# Patient Record
Sex: Female | Born: 1995 | Race: Black or African American | Hispanic: No | Smoking: Never smoker
Health system: Southern US, Community
[De-identification: ages and names within clinical notes are randomized; demographics above are authoritative.]

## PROBLEM LIST (undated history)

## (undated) ENCOUNTER — Inpatient Hospital Stay (HOSPITAL_COMMUNITY): Payer: Self-pay

## (undated) DIAGNOSIS — Z789 Other specified health status: Secondary | ICD-10-CM

## (undated) HISTORY — DX: Other specified health status: Z78.9

## (undated) HISTORY — PX: NO PAST SURGERIES: SHX2092

## (undated) HISTORY — PX: COSMETIC SURGERY: SHX468

---

## 2013-06-25 ENCOUNTER — Emergency Department (HOSPITAL_COMMUNITY)
Admission: EM | Admit: 2013-06-25 | Discharge: 2013-06-25 | Disposition: A | Discharge status: Home / Self Care | Payer: PRIVATE HEALTH INSURANCE | Attending: Emergency Medicine | Admitting: Emergency Medicine

## 2013-06-25 ENCOUNTER — Encounter (HOSPITAL_COMMUNITY): Payer: Self-pay | Admitting: Emergency Medicine

## 2013-06-25 DIAGNOSIS — J069 Acute upper respiratory infection, unspecified: Secondary | ICD-10-CM | POA: Insufficient documentation

## 2013-06-25 DIAGNOSIS — Z880 Allergy status to penicillin: Secondary | ICD-10-CM | POA: Insufficient documentation

## 2013-06-25 DIAGNOSIS — J029 Acute pharyngitis, unspecified: Secondary | ICD-10-CM

## 2013-06-25 LAB — MONONUCLEOSIS SCREEN: MONO SCREEN: NEGATIVE

## 2013-06-25 LAB — RAPID STREP SCREEN (MED CTR MEBANE ONLY): Streptococcus, Group A Screen (Direct): NEGATIVE

## 2013-06-25 MED ORDER — MAGIC MOUTHWASH
5.0000 mL | Freq: Once | ORAL | Status: AC
  Administered 2013-06-25: 5 mL via ORAL
  Filled 2013-06-25: qty 5

## 2013-06-25 MED ORDER — HYDROCODONE-ACETAMINOPHEN 7.5-325 MG/15ML PO SOLN: 10.0000 mL | Freq: Four times a day (QID) | ORAL | Status: DC | PRN

## 2013-06-25 NOTE — ED Provider Notes (Signed)
Medical screening examination/treatment/procedure(s) were performed by non-physician practitioner and as supervising physician I was immediately available for consultation/collaboration.   EKG Interpretation None        Gwyneth Sprout, MD 06/25/13 1236

## 2013-06-25 NOTE — ED Notes (Signed)
Started last pm with sore throat, neck pain and headache.

## 2013-06-25 NOTE — Discharge Instructions (Signed)
Upper Respiratory Infection, Adult °An upper respiratory infection (URI) is also sometimes known as the common cold. The upper respiratory tract includes the nose, sinuses, throat, trachea, and bronchi. Bronchi are the airways leading to the lungs. Most people improve within 1 week, but symptoms can last up to 2 weeks. A residual cough may last even longer.  °CAUSES °Many different viruses can infect the tissues lining the upper respiratory tract. The tissues become irritated and inflamed and often become very moist. Mucus production is also common. A cold is contagious. You can easily spread the virus to others by oral contact. This includes kissing, sharing a glass, coughing, or sneezing. Touching your mouth or nose and then touching a surface, which is then touched by another person, can also spread the virus. °SYMPTOMS  °Symptoms typically develop 1 to 3 days after you come in contact with a cold virus. Symptoms vary from person to person. They may include: °· Runny nose. °· Sneezing. °· Nasal congestion. °· Sinus irritation. °· Sore throat. °· Loss of voice (laryngitis). °· Cough. °· Fatigue. °· Muscle aches. °· Loss of appetite. °· Headache. °· Low-grade fever. °DIAGNOSIS  °You might diagnose your own cold based on familiar symptoms, since most people get a cold 2 to 3 times a year. Your caregiver can confirm this based on your exam. Most importantly, your caregiver can check that your symptoms are not due to another disease such as strep throat, sinusitis, pneumonia, asthma, or epiglottitis. Blood tests, throat tests, and X-rays are not necessary to diagnose a common cold, but they may sometimes be helpful in excluding other more serious diseases. Your caregiver will decide if any further tests are required. °RISKS AND COMPLICATIONS  °You may be at risk for a more severe case of the common cold if you smoke cigarettes, have chronic heart disease (such as heart failure) or lung disease (such as asthma), or if  you have a weakened immune system. The very young and very old are also at risk for more serious infections. Bacterial sinusitis, middle ear infections, and bacterial pneumonia can complicate the common cold. The common cold can worsen asthma and chronic obstructive pulmonary disease (COPD). Sometimes, these complications can require emergency medical care and may be life-threatening. °PREVENTION  °The best way to protect against getting a cold is to practice good hygiene. Avoid oral or hand contact with people with cold symptoms. Wash your hands often if contact occurs. There is no clear evidence that vitamin C, vitamin E, echinacea, or exercise reduces the chance of developing a cold. However, it is always recommended to get plenty of rest and practice good nutrition. °TREATMENT  °Treatment is directed at relieving symptoms. There is no cure. Antibiotics are not effective, because the infection is caused by a virus, not by bacteria. Treatment may include: °· Increased fluid intake. Sports drinks offer valuable electrolytes, sugars, and fluids. °· Breathing heated mist or steam (vaporizer or shower). °· Eating chicken soup or other clear broths, and maintaining good nutrition. °· Getting plenty of rest. °· Using gargles or lozenges for comfort. °· Controlling fevers with ibuprofen or acetaminophen as directed by your caregiver. °· Increasing usage of your inhaler if you have asthma. °Zinc gel and zinc lozenges, taken in the first 24 hours of the common cold, can shorten the duration and lessen the severity of symptoms. Pain medicines may help with fever, muscle aches, and throat pain. A variety of non-prescription medicines are available to treat congestion and runny nose. Your caregiver   can make recommendations and may suggest nasal or lung inhalers for other symptoms.  HOME CARE INSTRUCTIONS   Only take over-the-counter or prescription medicines for pain, discomfort, or fever as directed by your  caregiver.  Use a warm mist humidifier or inhale steam from a shower to increase air moisture. This may keep secretions moist and make it easier to breathe.  Drink enough water and fluids to keep your urine clear or pale yellow.  Rest as needed.  Return to work when your temperature has returned to normal or as your caregiver advises. You may need to stay home longer to avoid infecting others. You can also use a face mask and careful hand washing to prevent spread of the virus. SEEK MEDICAL CARE IF:   After the first few days, you feel you are getting worse rather than better.  You need your caregiver's advice about medicines to control symptoms.  You develop chills, worsening shortness of breath, or brown or red sputum. These may be signs of pneumonia.  You develop yellow or brown nasal discharge or pain in the face, especially when you bend forward. These may be signs of sinusitis.  You develop a fever, swollen neck glands, pain with swallowing, or white areas in the back of your throat. These may be signs of strep throat. SEEK IMMEDIATE MEDICAL CARE IF:   You have a fever.  You develop severe or persistent headache, ear pain, sinus pain, or chest pain.  You develop wheezing, a prolonged cough, cough up blood, or have a change in your usual mucus (if you have chronic lung disease).  You develop sore muscles or a stiff neck. Document Released: 07/17/2000 Document Revised: 04/15/2011 Document Reviewed: 05/25/2010 [facility] Patient Information 2014 [place], [place].  Viral Pharyngitis Viral pharyngitis is a viral infection that produces redness, pain, and swelling (inflammation) of the throat. It can spread from person to person (contagious). CAUSES Viral pharyngitis is caused by inhaling a large amount of certain germs called viruses. Many different viruses cause viral pharyngitis. SYMPTOMS Symptoms of viral pharyngitis include:  Sore throat.  Tiredness.  Stuffy  nose.  Low-grade fever.  Congestion.  Cough. TREATMENT Treatment includes rest, drinking plenty of fluids, and the use of over-the-counter medication (approved by your caregiver). HOME CARE INSTRUCTIONS   Drink enough fluids to keep your urine clear or pale yellow.  Eat soft, cold foods such as ice cream, frozen ice pops, or gelatin dessert.  Gargle with warm salt water (1 tsp salt per 1 qt of water).  If over age 30, throat lozenges may be used safely.  Only take over-the-counter or prescription medicines for pain, discomfort, or fever as directed by your caregiver. Do not take aspirin. To help prevent spreading viral pharyngitis to others, avoid:  Mouth-to-mouth contact with others.  Sharing utensils for eating and drinking.  Coughing around others. SEEK MEDICAL CARE IF:   You are better in a few days, then become worse.  You have a fever or pain not helped by pain medicines.  There are any other changes that concern you. Document Released: 10/31/2004 Document Revised: 04/15/2011 Document Reviewed: 03/29/2010 [facility] Patient Information 2014 [place], [place].

## 2013-06-25 NOTE — ED Provider Notes (Signed)
CSN: [id number]     Arrival date & time 06/25/13  1043 History  This chart was scribed for non-physician practitioner, Fayrene Helper, PA-C working with Gwyneth Sprout, MD by Greggory Stallion, ED scribe. This patient was seen in room TR08C/TR08C and the patient's care was started at 11:05 AM.   Chief Complaint  Patient presents with  . Sore Throat   The history is provided by the patient. No language interpreter was used.   HPI Comments: Ashley [name] is a 18 y.o. female who presents to the Emergency Department complaining of gradual onset sore throat that started yesterday. She is also complaining of headache, chills, sneezing, cough and neck pain that started this morning. Pt has had strep throat a couple of times in the past. Denies fever, rhinorrhea, congestion, ear pain, rash, abdominal pain. Denies sick contacts.    History reviewed. No pertinent past medical history. History reviewed. No pertinent past surgical history. No family history on file. History  Substance Use Topics  . Smoking status: Never Smoker   . Smokeless tobacco: Not on file  . Alcohol Use: No   OB History   Grav Para Term Preterm Abortions TAB SAB Ect Mult Living                 Review of Systems  Constitutional: Positive for chills. Negative for fever.  HENT: Positive for sneezing and sore throat. Negative for congestion, ear pain and rhinorrhea.   Respiratory: Positive for cough.   Gastrointestinal: Negative for abdominal pain.  Musculoskeletal: Positive for neck pain.  Skin: Negative for rash.  Neurological: Positive for headaches.  All other systems reviewed and are negative.  Allergies  Penicillins  Home Medications   Prior to Admission medications   Not on File   BP 112/62  Pulse 82  Temp(Src) 98.3 F (36.8 C) (Oral)  SpO2 100%  Physical Exam  Nursing note and vitals reviewed. Constitutional: She is oriented to person, place, and time. She appears well-developed and well-nourished. No  distress.  HENT:  Head: Normocephalic and atraumatic.  Right Ear: Tympanic membrane and ear canal normal.  Left Ear: Tympanic membrane and ear canal normal.  Nose: Rhinorrhea present.  Mouth/Throat: Uvula is midline. No trismus in the jaw. Oropharyngeal exudate (bilateral) present. No posterior oropharyngeal edema.  No deep tissue infection. No obvious abscess.   Eyes: EOM are normal.  Neck: Neck supple. No tracheal deviation present.  Cardiovascular: Normal rate, regular rhythm and normal heart sounds.   Pulmonary/Chest: Effort normal and breath sounds normal. No respiratory distress. She has no wheezes. She has no rhonchi. She has no rales.  Abdominal: Soft. There is no splenomegaly. There is no tenderness.  Musculoskeletal: Normal range of motion.  Neurological: She is alert and oriented to person, place, and time.  Skin: Skin is warm and dry.  Psychiatric: She has a normal mood and affect. Her behavior is normal.    ED Course  Procedures (including critical care time)  DIAGNOSTIC STUDIES: Oxygen Saturation is 100% on RA, normal by my interpretation.    COORDINATION OF CARE: 11:08 AM-Discussed treatment plan which includes rapid strep test and mono test with pt at bedside and pt agreed to plan.   12:30 PM Rapid strep and monospot neg.  Likely viral URI.  Will treat sxs including gargle with salt water.  Return precaution discussed.  Labs Review Labs Reviewed  RAPID STREP SCREEN  CULTURE, GROUP A STREP  MONONUCLEOSIS SCREEN    Imaging Review No results found.  EKG Interpretation None      MDM   Final diagnoses:  URI (upper respiratory infection)  Pharyngitis    BP 112/62  Pulse 82  Temp(Src) 98.3 F (36.8 C) (Oral)  SpO2 100%  I have reviewed nursing notes and vital signs.  I reviewed available ER/hospitalization records thought the EMR   I personally performed the services described in this documentation, which was scribed in my presence. The  recorded information has been reviewed and is accurate.  Fayrene HelperBowie Janice Bodine, PA-C 06/25/13 1231

## 2013-06-27 LAB — CULTURE, GROUP A STREP

## 2014-12-02 DIAGNOSIS — S060X0A Concussion without loss of consciousness, initial encounter: Secondary | ICD-10-CM

## 2014-12-02 HISTORY — DX: Concussion without loss of consciousness, initial encounter: S06.0X0A

## 2015-02-05 NOTE — L&D Delivery Note (Signed)
Delivery Note  20 y/o now G1P1 induced for gHTN induced with cyctoec. Pt did have recurrent  variables and had IUPC placed with amnioinfusion. Variables did improve At  a viable female was delivered via  (Presentation: ROA with compount hand ;  ).  APGAR: 9 and 9,   Placenta status: intact delivered intact with gentle traction.  Cord: 3 vessels.  Anesthesia:  epidural Episiotomy:   none Lacerations:   2nd degree posterior Suture Repair: 3.0 vicryl Est. Blood Loss (mL):   200  Mom to postpartum.  Baby to Couplet care / Skin to Skin.  Ernestina Pennaicholas Schenk 09/20/2015, 2:43 PM

## 2015-02-23 ENCOUNTER — Telehealth: Payer: Self-pay | Admitting: *Deleted

## 2015-02-23 NOTE — Telephone Encounter (Signed)
Patient's mother is calling- her daughter has an appointment on February 14- she has been having problems eating and drinking. Some nausea but no vomiting at this point. Advised OTC Unisom,Bonine, Vit B6, grazing, bland diet, Tums , Mylanta. Techniques for eating during the first trimester. If she should get worse and start vomiting and not keep anything down- advised MAU for fluids and IV meds.

## 2015-03-21 ENCOUNTER — Encounter: Payer: Self-pay | Admitting: Certified Nurse Midwife

## 2015-03-21 ENCOUNTER — Ambulatory Visit (INDEPENDENT_AMBULATORY_CARE_PROVIDER_SITE_OTHER): Payer: Medicaid Other | Admitting: Certified Nurse Midwife

## 2015-03-21 VITALS — BP 86/61 | HR 78 | Temp 98.1°F | Ht 67.0 in | Wt 131.0 lb

## 2015-03-21 DIAGNOSIS — Z3490 Encounter for supervision of normal pregnancy, unspecified, unspecified trimester: Secondary | ICD-10-CM | POA: Insufficient documentation

## 2015-03-21 DIAGNOSIS — Z3401 Encounter for supervision of normal first pregnancy, first trimester: Secondary | ICD-10-CM

## 2015-03-21 DIAGNOSIS — O219 Vomiting of pregnancy, unspecified: Secondary | ICD-10-CM

## 2015-03-21 LAB — POCT URINALYSIS DIPSTICK
BILIRUBIN UA: NEGATIVE
Blood, UA: NEGATIVE
Glucose, UA: NEGATIVE
KETONES UA: NEGATIVE
LEUKOCYTES UA: NEGATIVE
Nitrite, UA: NEGATIVE
Protein, UA: NEGATIVE
Spec Grav, UA: 1.01
Urobilinogen, UA: NEGATIVE
pH, UA: 8

## 2015-03-21 MED ORDER — VITAFOL GUMMIES 3.33-0.333-34.8 MG PO CHEW
3.0000 | CHEWABLE_TABLET | Freq: Every day | ORAL | Status: DC
Start: 2015-03-21 — End: 2015-09-08

## 2015-03-21 MED ORDER — DOXYLAMINE-PYRIDOXINE 10-10 MG PO TBEC: DELAYED_RELEASE_TABLET | ORAL | Status: DC

## 2015-03-21 NOTE — Progress Notes (Signed)
Subjective:    Ashley [name] is being seen today for her first obstetrical visit.  This is not a planned pregnancy. She is at [redacted]w[redacted]d gestation. Her obstetrical history is significant for none. Relationship with FOB: not involved, unsure of FOB. Patient does intend to breast feed. Pregnancy history fully reviewed.  In school full time.    The information documented in the HPI was reviewed and verified.  Menstrual History: OB History    Gravida Para Term Preterm AB TAB SAB Ectopic Multiple Living   1               Menarche age: around 20 years of age  Patient's last menstrual period was 12/27/2014.    Past Medical History  Diagnosis Date  . Medical history non-contributory     History reviewed. No pertinent past surgical history.   (Not in a hospital admission) Allergies  Allergen Reactions  . Penicillins Hives and Rash    Foamed from mouth    Social History  Substance Use Topics  . Smoking status: Never Smoker   . Smokeless tobacco: Never Used  . Alcohol Use: No    Family History  Problem Relation Age of Onset  . Diabetes Mother   . Hypertension Mother      Review of Systems Constitutional: negative for weight loss Gastrointestinal: negative for vomiting, + nausea Genitourinary:negative for genital lesions and vaginal discharge and dysuria Musculoskeletal:negative for back pain Behavioral/Psych: negative for abusive relationship, depression, illegal drug usage and tobacco use    Objective:    BP 86/61 mmHg  Pulse 78  Temp(Src) 98.1 F (36.7 C)  Ht  (1.702 m)  Wt 131 lb (59.421 kg)  BMI 20.51 kg/m2  LMP 12/27/2014 General Appearance:    Alert, cooperative, no distress, appears stated age  Head:    Normocephalic, without obvious abnormality, atraumatic  Eyes:    PERRL, conjunctiva/corneas clear, EOM's intact, fundi    benign, both eyes  Ears:    Normal TM's and external ear canals, both ears  Nose:   Nares normal, septum midline, mucosa normal, no  drainage    or sinus tenderness  Throat:   Lips, mucosa, and tongue normal; teeth and gums normal  Neck:   Supple, symmetrical, trachea midline, no adenopathy;    thyroid:  no enlargement/tenderness/nodules; no carotid   bruit or JVD  Back:     Symmetric, no curvature, ROM normal, no CVA tenderness  Lungs:     Clear to auscultation bilaterally, respirations unlabored  Chest Wall:    No tenderness or deformity   Heart:    Regular rate and rhythm, S1 and S2 normal, no murmur, rub   or gallop  Breast Exam:    No tenderness, masses, or nipple abnormality  Abdomen:     Soft, non-tender, bowel sounds active all four quadrants,    no masses, no organomegaly  Genitalia:    Normal female without lesion, discharge or tenderness  Extremities:   Extremities normal, atraumatic, no cyanosis or edema  Pulses:   2+ and symmetric all extremities  Skin:   Skin color, texture, turgor normal, no rashes or lesions  Lymph nodes:   Cervical, supraclavicular, and axillary nodes normal  Neurologic:   CNII-XII intact, normal strength, sensation and reflexes    throughout         Cervix:  Long, thick, closed and posterior.  FHR seen with bedside US.      Lab Review Urine pregnancy test Labs reviewed yes Radiologic  studies reviewed no Assessment:    Pregnancy at [redacted]w[redacted]d weeks    Plan:      Prenatal vitamins.  Counseling provided regarding continued use of seat belts, cessation of alcohol consumption, smoking or use of illicit drugs; infection precautions i.e., influenza/TDAP immunizations, toxoplasmosis,CMV, parvovirus, listeria and varicella; workplace safety, exercise during pregnancy; routine dental care, safe medications, sexual activity, hot tubs, saunas, pools, travel, caffeine use, fish and methlymercury, potential toxins, hair treatments, varicose veins Weight gain recommendations per IOM guidelines reviewed: underweight/BMI< 18.5--> gain 28 - 40 lbs; normal weight/BMI 18.5 - 24.9--> gain 25 - 35 lbs;  overweight/BMI 25 - 29.9--> gain 15 - 25 lbs; obese/BMI >30->gain  11 - 20 lbs Problem list reviewed and updated. FIRST/CF mutation testing/NIPT/QUAD SCREEN/fragile X/[name] population testing/Spinal muscular atrophy discussed: requested. Role of ultrasound in pregnancy discussed; fetal survey: requested. Amniocentesis discussed: not indicated. VBAC calculator score: VBAC consent form provided No orders of the defined types were placed in this encounter.   Orders Placed This Encounter  Procedures  . Culture, OB Urine  . SureSwab, Vaginosis/Vaginitis Plus  . Obstetric panel  . HIV antibody  . Hemoglobinopathy evaluation  . Varicella zoster antibody, IgG  . VITAMIN D 25 Hydroxy (Vit-D Deficiency, Fractures)  . POCT urinalysis dipstick    Follow up in 4 weeks. 50% of 30 min visit spent on counseling and coordination of care.

## 2015-03-22 ENCOUNTER — Ambulatory Visit (INDEPENDENT_AMBULATORY_CARE_PROVIDER_SITE_OTHER): Payer: Medicaid Other

## 2015-03-22 DIAGNOSIS — O3680X1 Pregnancy with inconclusive fetal viability, fetus 1: Secondary | ICD-10-CM | POA: Diagnosis not present

## 2015-03-22 DIAGNOSIS — Z3401 Encounter for supervision of normal first pregnancy, first trimester: Secondary | ICD-10-CM

## 2015-03-22 LAB — OBSTETRIC PANEL
Antibody Screen: NEGATIVE
BASOS ABS: 0 10*3/uL (ref 0.0–0.1)
Basophils Relative: 0 % (ref 0–1)
Eosinophils Absolute: 0.1 10*3/uL (ref 0.0–0.7)
Eosinophils Relative: 2 % (ref 0–5)
HEMATOCRIT: 35.8 % — AB (ref 36.0–46.0)
HEP B S AG: NEGATIVE
Hemoglobin: 11.4 g/dL — ABNORMAL LOW (ref 12.0–15.0)
LYMPHS PCT: 20 % (ref 12–46)
Lymphs Abs: 1.4 10*3/uL (ref 0.7–4.0)
MCH: 28.7 pg (ref 26.0–34.0)
MCHC: 31.8 g/dL (ref 30.0–36.0)
MCV: 90.2 fL (ref 78.0–100.0)
MPV: 11.2 fL (ref 8.6–12.4)
Monocytes Absolute: 0.4 10*3/uL (ref 0.1–1.0)
Monocytes Relative: 6 % (ref 3–12)
NEUTROS ABS: 5 10*3/uL (ref 1.7–7.7)
NEUTROS PCT: 72 % (ref 43–77)
Platelets: 251 10*3/uL (ref 150–400)
RBC: 3.97 MIL/uL (ref 3.87–5.11)
RDW: 16.7 % — AB (ref 11.5–15.5)
Rh Type: POSITIVE
Rubella: 5.13 Index — ABNORMAL HIGH (ref <0.90)
WBC: 7 10*3/uL (ref 4.0–10.5)

## 2015-03-22 LAB — HIV ANTIBODY (ROUTINE TESTING W REFLEX): HIV 1&2 Ab, 4th Generation: NONREACTIVE

## 2015-03-22 LAB — VARICELLA ZOSTER ANTIBODY, IGG: Varicella IgG: 397.6 Index — ABNORMAL HIGH (ref <135.00)

## 2015-03-22 LAB — VITAMIN D 25 HYDROXY (VIT D DEFICIENCY, FRACTURES): Vit D, 25-Hydroxy: 29 ng/mL — ABNORMAL LOW (ref 30–100)

## 2015-03-22 LAB — CULTURE, OB URINE
Colony Count: NO GROWTH
ORGANISM ID, BACTERIA: NO GROWTH

## 2015-03-23 ENCOUNTER — Telehealth: Payer: Self-pay | Admitting: *Deleted

## 2015-03-23 LAB — HEMOGLOBINOPATHY EVALUATION
HGB A2 QUANT: 2.6 % (ref 2.2–3.2)
HGB S QUANTITAION: 0 %
Hemoglobin Other: 0 %
Hgb A: 97.4 % (ref 96.8–97.8)
Hgb F Quant: 0 % (ref 0.0–2.0)

## 2015-03-23 NOTE — Telephone Encounter (Signed)
Pt mother called to office stating her daughter was seen this week for her OB visit and she thinks she may have strep throat. Would like to know what pt could take or do for throat.  Return call. Made aware that pt may be seen in office tomorrow for strep test. Made aware that only medication for strep is antibiotics.  Made aware that she may try Cepacol lozenges.  Advised that if she continues to monitor symptoms and does not come to office she may be seen at urgent care as well.

## 2015-03-25 LAB — SURESWAB, VAGINOSIS/VAGINITIS PLUS
Atopobium vaginae: NOT DETECTED Log (cells/mL)
C. ALBICANS, DNA: DETECTED — AB
C. GLABRATA, DNA: NOT DETECTED
C. PARAPSILOSIS, DNA: DETECTED — AB
C. trachomatis RNA, TMA: NOT DETECTED
C. tropicalis, DNA: NOT DETECTED
Gardnerella vaginalis: NOT DETECTED Log (cells/mL)
LACTOBACILLUS SPECIES: 7.6 Log (cells/mL)
MEGASPHAERA SPECIES: NOT DETECTED Log (cells/mL)
N. GONORRHOEAE RNA, TMA: NOT DETECTED
T. VAGINALIS RNA, QL TMA: NOT DETECTED

## 2015-03-28 ENCOUNTER — Other Ambulatory Visit: Payer: Self-pay | Admitting: Certified Nurse Midwife

## 2015-03-28 ENCOUNTER — Telehealth: Payer: Self-pay | Admitting: *Deleted

## 2015-03-28 DIAGNOSIS — N760 Acute vaginitis: Secondary | ICD-10-CM

## 2015-03-28 MED ORDER — TERCONAZOLE 0.4 % VA CREA: 1.0000 | TOPICAL_CREAM | Freq: Every day | VAGINAL | Status: DC

## 2015-03-28 MED ORDER — FLUCONAZOLE 100 MG PO TABS: 100.0000 mg | ORAL_TABLET | Freq: Once | ORAL | Status: DC

## 2015-03-28 NOTE — Telephone Encounter (Signed)
Attempt to contact pt regarding Diclegis Rx. LM on VM making pt aware medication has been approved by insurance.  Advised to make pharmacy aware of any updates to insurance.

## 2015-03-30 ENCOUNTER — Other Ambulatory Visit: Payer: Medicaid Other

## 2015-04-18 ENCOUNTER — Ambulatory Visit (INDEPENDENT_AMBULATORY_CARE_PROVIDER_SITE_OTHER): Payer: Medicaid Other | Admitting: Certified Nurse Midwife

## 2015-04-18 VITALS — BP 111/61 | HR 79 | Temp 98.2°F | Wt 131.0 lb

## 2015-04-18 DIAGNOSIS — Z3402 Encounter for supervision of normal first pregnancy, second trimester: Secondary | ICD-10-CM

## 2015-04-18 LAB — POCT URINALYSIS DIPSTICK
BILIRUBIN UA: NEGATIVE
Blood, UA: NEGATIVE
GLUCOSE UA: NEGATIVE
Ketones, UA: NEGATIVE
LEUKOCYTES UA: NEGATIVE
Nitrite, UA: NEGATIVE
Protein, UA: NEGATIVE
Spec Grav, UA: 1.015
Urobilinogen, UA: NEGATIVE
pH, UA: 6

## 2015-04-18 NOTE — Progress Notes (Signed)
  Subjective:    Donneisha Harland DingwallSettle is a 20 y.o. female being seen today for her obstetrical visit. She is at 6055w0d gestation. Patient reports: no complaints.  Problem List Items Addressed This Visit    None    Visit Diagnoses    Encounter for supervision of normal first pregnancy in second trimester    -  Primary    Relevant Orders    POCT urinalysis dipstick    US OB Comp + 14 Wk    AFP, Quad Screen    Hemoglobin A1c      Patient Active Problem List   Diagnosis Date Noted  . Supervision of normal first pregnancy 03/21/2015    Objective:     BP 111/61 mmHg  Pulse 79  Temp(Src) 98.2 F (36.8 C)  Wt 131 lb (59.421 kg)  LMP 12/27/2014 Uterine Size: Below umbilicus   FHR: 158 by doppler  Assessment:    Pregnancy @ 5555w0d  weeks Doing well    Plan:    Problem list reviewed and updated. Labs reviewed.  Follow up in 4 weeks. FIRST/CF mutation testing/NIPT/QUAD SCREEN/fragile X/[name] population testing/Spinal muscular atrophy discussed: ordered. Role of ultrasound in pregnancy discussed; fetal survey: ordered. Amniocentesis discussed: not indicated. 50% of 15 minute visit spent on counseling and coordination of care.

## 2015-04-21 LAB — AFP, QUAD SCREEN
DIA MOM VALUE: 0.56
DIA Value (EIA): 112.62 pg/mL
DSR (By Age)    1 IN: 1137
DSR (Second Trimester) 1 IN: 8504
Gestational Age: 15.9 WEEKS
MATERNAL AGE AT EDD: 21.6 a
MSAFP Mom: 0.66
MSAFP: 23.4 ng/mL
MSHCG Mom: 1.72
MSHCG: 81024 m[IU]/mL
OSB RISK: 10000
PDF: 0
T18 (By Age): 1:4429 {titer}
Test Results:: NEGATIVE
UE3 MOM: 0.89
Weight: 131 [lb_av]
uE3 Value: 0.73 ng/mL

## 2015-04-21 LAB — HEMOGLOBIN A1C
ESTIMATED AVERAGE GLUCOSE: 114 mg/dL
HEMOGLOBIN A1C: 5.6 % (ref 4.8–5.6)

## 2015-05-02 ENCOUNTER — Telehealth: Payer: Self-pay | Admitting: *Deleted

## 2015-05-02 NOTE — Telephone Encounter (Signed)
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 Explained to patient- as with any job- as long as she is comfortable and not doing strenuous work- she she be fine. Patient agrees.

## 2015-05-04 ENCOUNTER — Ambulatory Visit (INDEPENDENT_AMBULATORY_CARE_PROVIDER_SITE_OTHER): Payer: Medicaid Other

## 2015-05-04 DIAGNOSIS — Z36 Encounter for antenatal screening of mother: Secondary | ICD-10-CM

## 2015-05-04 DIAGNOSIS — Z3402 Encounter for supervision of normal first pregnancy, second trimester: Secondary | ICD-10-CM

## 2015-05-16 ENCOUNTER — Ambulatory Visit (INDEPENDENT_AMBULATORY_CARE_PROVIDER_SITE_OTHER): Payer: Medicaid Other | Admitting: Certified Nurse Midwife

## 2015-05-16 ENCOUNTER — Telehealth: Payer: Self-pay

## 2015-05-16 VITALS — BP 109/69 | HR 64 | Wt 130.0 lb

## 2015-05-16 DIAGNOSIS — Z3402 Encounter for supervision of normal first pregnancy, second trimester: Secondary | ICD-10-CM

## 2015-05-16 LAB — POCT URINALYSIS DIPSTICK
Bilirubin, UA: NEGATIVE
Glucose, UA: NEGATIVE
Ketones, UA: NEGATIVE
LEUKOCYTES UA: NEGATIVE
Nitrite, UA: NEGATIVE
PH UA: 6
PROTEIN UA: NEGATIVE
RBC UA: NEGATIVE
Spec Grav, UA: 1.02
UROBILINOGEN UA: NEGATIVE

## 2015-05-16 NOTE — Progress Notes (Signed)
Pt states that she had severe cramping on Sunday.  Pt denies any bleeding, no cramping since then.  Pt does report +FM.

## 2015-05-16 NOTE — Telephone Encounter (Signed)
[facility] said Rosey Batheresa would have to resend images to get report - U/S done 05/04/15

## 2015-05-16 NOTE — Patient Instructions (Signed)

## 2015-05-16 NOTE — Progress Notes (Signed)
Subjective:    Dalynn Harland DingwallSettle is a 20 y.o. female being seen today for her obstetrical visit. She is at 2327w0d gestation. Patient reports: no complaints . Fetal movement: normal.  Discussed cramping that occurred on Sunday, lasted less than 1 hour, encouraged fluids and tylenol.  If occurs again and lasts longer after interventions or vaginal bleeding/leaking head to MAU.  Patient verbalized understanding.   Problem List Items Addressed This Visit    None    Visit Diagnoses    Encounter for supervision of normal first pregnancy in second trimester    -  Primary    Relevant Orders    POCT urinalysis dipstick (Completed)      Patient Active Problem List   Diagnosis Date Noted  . Supervision of normal first pregnancy 03/21/2015   Objective:    BP 109/69 mmHg  Pulse 64  Wt 130 lb (58.968 kg)  LMP 12/27/2014 FHT: 145 BPM  Uterine Size: size equals dates     Assessment:    Pregnancy @ 4027w0d    Normal pregnancy discomforts  Plan:    OBGCT: discussed. Signs and symptoms of preterm labor: discussed.  Labs, problem list reviewed and updated 2 hr GTT planned Follow up in 4 weeks.

## 2015-05-23 ENCOUNTER — Other Ambulatory Visit: Payer: Self-pay | Admitting: Certified Nurse Midwife

## 2015-05-29 ENCOUNTER — Encounter (HOSPITAL_COMMUNITY): Payer: Self-pay

## 2015-05-29 ENCOUNTER — Telehealth: Payer: Self-pay | Admitting: *Deleted

## 2015-05-29 ENCOUNTER — Inpatient Hospital Stay (HOSPITAL_COMMUNITY)
Admission: AD | Admit: 2015-05-29 | Discharge: 2015-05-29 | Disposition: A | Discharge status: Home / Self Care | Payer: Medicaid Other | Source: Ambulatory Visit | Attending: Obstetrics | Admitting: Obstetrics

## 2015-05-29 DIAGNOSIS — O2652 Maternal hypotension syndrome, second trimester: Secondary | ICD-10-CM | POA: Insufficient documentation

## 2015-05-29 DIAGNOSIS — Z3A21 21 weeks gestation of pregnancy: Secondary | ICD-10-CM | POA: Insufficient documentation

## 2015-05-29 DIAGNOSIS — R42 Dizziness and giddiness: Secondary | ICD-10-CM | POA: Diagnosis present

## 2015-05-29 DIAGNOSIS — Z880 Allergy status to penicillin: Secondary | ICD-10-CM | POA: Insufficient documentation

## 2015-05-29 LAB — CBC
HEMATOCRIT: 33.6 % — AB (ref 36.0–46.0)
HEMOGLOBIN: 11.4 g/dL — AB (ref 12.0–15.0)
MCH: 30.8 pg (ref 26.0–34.0)
MCHC: 33.9 g/dL (ref 30.0–36.0)
MCV: 90.8 fL (ref 78.0–100.0)
Platelets: 232 10*3/uL (ref 150–400)
RBC: 3.7 MIL/uL — AB (ref 3.87–5.11)
RDW: 13.9 % (ref 11.5–15.5)
WBC: 11.7 10*3/uL — ABNORMAL HIGH (ref 4.0–10.5)

## 2015-05-29 LAB — GLUCOSE, CAPILLARY: Glucose-Capillary: 69 mg/dL (ref 65–99)

## 2015-05-29 LAB — URINALYSIS, ROUTINE W REFLEX MICROSCOPIC
Bilirubin Urine: NEGATIVE
Glucose, UA: NEGATIVE mg/dL
Hgb urine dipstick: NEGATIVE
Ketones, ur: NEGATIVE mg/dL
LEUKOCYTES UA: NEGATIVE
NITRITE: NEGATIVE
PH: 6.5 (ref 5.0–8.0)
Protein, ur: NEGATIVE mg/dL
SPECIFIC GRAVITY, URINE: 1.01 (ref 1.005–1.030)

## 2015-05-29 NOTE — Discharge Instructions (Signed)
Hypotension As your heart beats, it forces blood through your body. This force is called blood pressure. If you have hypotension, you have low blood pressure. When your blood pressure is too low, you may not get enough blood to your brain. You may feel weak, feel lightheaded, have a fast heartbeat, or even pass out (faint). HOME CARE  Drink enough fluids to keep your pee (urine) clear or pale yellow.  Take all medicines as told by your doctor.  Get up slowly after sitting or lying down.  Wear support stockings as told by your doctor.  Maintain a healthy diet by including foods such as fruits, vegetables, nuts, whole grains, and lean meats. GET HELP IF:  You are throwing up (vomiting) or have watery poop (diarrhea).  You have a fever for more than 2-3 days.  You feel more thirsty than usual.  You feel weak and tired. GET HELP RIGHT AWAY IF:   You pass out (faint).  You have chest pain or a fast or irregular heartbeat.  You lose feeling in part of your body.  You cannot move your arms or legs.  You have trouble speaking.  You get sweaty or feel lightheaded. MAKE SURE YOU:   Understand these instructions.  Will watch your condition.  Will get help right away if you are not doing well or get worse.   This information is not intended to replace advice given to you by your health care provider. Make sure you discuss any questions you have with your health care provider.   Document Released: 04/17/2009 Document Revised: 09/23/2012 Document Reviewed: 07/24/2012 Elsevier Interactive Patient Education 2016 [employer]  

## 2015-05-29 NOTE — Telephone Encounter (Signed)
Patient was told to call her provider- she had contacted the hospital with ears ringing- dizziness. 4:00 Call to patient- she is experiencing periods of ear ringing and dizziness. Legs get numb and weak. Patient is nauseous and is having trouble eating. Reviewed reasons people feel faint during pregnancy- hypo glycemia, BP changes, ect. Patient will try to eat more often. Appointment givne for Wednesday pm to discuss possible solution and cause.

## 2015-05-29 NOTE — MAU Note (Signed)
Numbness and tingling in legs last Wed, gone the next day.  Started having ringing in her ears yesterday- not as bad as it was last night, was feeling fain- no longert.

## 2015-05-29 NOTE — MAU Provider Note (Signed)
History     CSN: [id number]  Arrival date and time: 05/29/15 1739   First Provider Initiated Contact with Patient 05/29/15 2026      Chief Complaint  Patient presents with  . Dizziness   HPI  Ms. Rayvin Bristow is a 20 y.o. G1P0 at [redacted]w[redacted]d who presents to MAU today with complaint of dizziness and numbness and tingling in her lower extremities. The patient states that she noted these symptoms over the last few days, but denies symptoms now. She denies chronic medical problems or complications with the pregnancy. She states that she spoke with RN at [facility] and was told to eat and rest with her legs elevated last night. She did those things and didn't feel much improvement, so she came to MAU. She also noted less fetal movement today. She denies vaginal bleeding, abdominal pain, fever, UTI symptoms, N/V/D today. She denies any current symptoms of dizziness or numbness or tingling.   OB History    Gravida Para Term Preterm AB TAB SAB Ectopic Multiple Living   1               Past Medical History  Diagnosis Date  . Medical history non-contributory     Past Surgical History  Procedure Laterality Date  . No past surgeries      Family History  Problem Relation Age of Onset  . Diabetes Mother   . Hypertension Mother     Social History  Substance Use Topics  . Smoking status: Never Smoker   . Smokeless tobacco: Never Used  . Alcohol Use: No    Allergies:  Allergies  Allergen Reactions  . Penicillins Hives and Rash    Foamed from mouth Has patient had a PCN reaction causing immediate rash, facial/tongue/throat swelling, SOB or lightheadedness with hypotension: Yes Has patient had a PCN reaction causing severe rash involving mucus membranes or skin necrosis: No Has patient had a PCN reaction that required hospitalization No Has patient had a PCN reaction occurring within the last 10 years: No If all of the above answers are "NO", then may proceed with Cephalosporin use.      Prescriptions prior to admission  Medication Sig Dispense Refill Last Dose  . Doxylamine-Pyridoxine (DICLEGIS) 10-10 MG TBEC Take 1 tablet with breakfast and lunch.  Take 2 tablets at bedtime. (Patient taking differently: Take 2 tablets by mouth at bedtime as needed (nausea). Take 1 tablet with breakfast and lunch.  Take 2 tablets at bedtime.) 100 tablet 4 Past Month at Unknown time  . Prenatal Vit-Fe Phos-FA-Omega (VITAFOL GUMMIES) 3.33-0.333-34.8 MG CHEW Chew 3 tablets by mouth daily. 90 tablet 12 05/29/2015 at Unknown time    Review of Systems  Constitutional: Negative for fever and malaise/fatigue.  Gastrointestinal: Positive for nausea. Negative for vomiting, abdominal pain, diarrhea and constipation.  Genitourinary: Negative for dysuria, urgency and frequency.       Neg - vaginal bleeding, discharge, LOF  Neurological: Positive for dizziness and weakness. Negative for loss of consciousness.   Physical Exam   Blood pressure 119/62, pulse 80, temperature 98.2 F (36.8 C), temperature source Oral, resp. rate 16, height  (1.702 m), weight 130 lb (58.968 kg), last menstrual period 12/27/2014, SpO2 100 %.  Physical Exam  Nursing note and vitals reviewed. Constitutional: She is oriented to person, place, and time. She appears well-developed and well-nourished. No distress.  HENT:  Head: Normocephalic and atraumatic.  Cardiovascular: Normal rate.   Respiratory: Effort normal.  GI: Soft. She exhibits no  distension and no mass. There is no tenderness. There is no rebound and no guarding.  Musculoskeletal: She exhibits no edema.  Neurological: She is alert and oriented to person, place, and time.  Skin: Skin is warm and dry. No erythema.  Psychiatric: She has a normal mood and affect.    Results for orders placed or performed during the hospital encounter of 05/29/15 (from the past 24 hour(s))  Urinalysis, Routine w reflex microscopic ([facility])     Status: None   Collection  Time: 05/29/15  5:45 PM  Result Value Ref Range   Color, Urine YELLOW YELLOW   APPearance CLEAR CLEAR   Specific Gravity, Urine 1.010 1.005 - 1.030   pH 6.5 5.0 - 8.0   Glucose, UA NEGATIVE NEGATIVE mg/dL   Hgb urine dipstick NEGATIVE NEGATIVE   Bilirubin Urine NEGATIVE NEGATIVE   Ketones, ur NEGATIVE NEGATIVE mg/dL   Protein, ur NEGATIVE NEGATIVE mg/dL   Nitrite NEGATIVE NEGATIVE   Leukocytes, UA NEGATIVE NEGATIVE  CBC     Status: Abnormal   Collection Time: 05/29/15  5:50 PM  Result Value Ref Range   WBC 11.7 (H) 4.0 - 10.5 K/uL   RBC 3.70 (L) 3.87 - 5.11 MIL/uL   Hemoglobin 11.4 (L) 12.0 - 15.0 g/dL   HCT 09.833.6 (L) 11.936.0 - 14.746.0 %   MCV 90.8 78.0 - 100.0 fL   MCH 30.8 26.0 - 34.0 pg   MCHC 33.9 30.0 - 36.0 g/dL   RDW 82.913.9 56.211.5 - 13.015.5 %   Platelets 232 150 - 400 K/uL  Glucose, capillary     Status: None   Collection Time: 05/29/15  8:41 PM  Result Value Ref Range   Glucose-Capillary 69 65 - 99 mg/dL   Orthostatic VS for the past 24 hrs:  BP- Lying Pulse- Lying BP- Sitting Pulse- Sitting BP- Standing at 0 minutes Pulse- Standing at 0 minutes  05/29/15 2021 122/71 mmHg 83 118/67 mmHg 79 116/70 mmHg 81    MAU Course  Procedures None  MDM FHR - 140 bpm with doppler UA, CBC and CBG today Orthostatic Vital signs today - normal Assessment and Plan  A: SIUP 7298w6d Maternal Hypotension in Pregnancy  P: Discharge home Second trimester precautions discussed Increased PO hydration, small frequent snacks and slow change of positions discussed Patient advised to follow-up with [facility] as scheduled for routine prenatal care Patient may return to MAU as needed or if her condition were to change or worsen   Marny LowensteinJulie N Wenzel, PA-C  05/29/2015, 8:44 PM

## 2015-05-31 ENCOUNTER — Ambulatory Visit (INDEPENDENT_AMBULATORY_CARE_PROVIDER_SITE_OTHER): Payer: Medicaid Other | Admitting: Certified Nurse Midwife

## 2015-05-31 VITALS — BP 106/70 | HR 72 | Wt 133.0 lb

## 2015-05-31 DIAGNOSIS — Z3402 Encounter for supervision of normal first pregnancy, second trimester: Secondary | ICD-10-CM

## 2015-05-31 LAB — POCT URINALYSIS DIPSTICK
BILIRUBIN UA: NEGATIVE
Blood, UA: NEGATIVE
Glucose, UA: NEGATIVE
KETONES UA: NEGATIVE
Leukocytes, UA: NEGATIVE
Nitrite, UA: NEGATIVE
PH UA: 8
Protein, UA: NEGATIVE
Spec Grav, UA: <=1.005
Urobilinogen, UA: NEGATIVE

## 2015-05-31 NOTE — Progress Notes (Signed)
Subjective:    Ashley [name] is a 20 y.o. female being seen today for her obstetrical visit. She is at 7845w1d gestation. Patient reports: fatigue, no bleeding, no contractions, no cramping, no leaking and dizziness.  Does not always eat 3 meals a day, reports sleeping a few hours each night.  Discussed use of benadryl at night to help her sleep along with increased food intake.  Patient verbalized understanding . Fetal movement: normal.  Problem List Items Addressed This Visit    None    Visit Diagnoses    Encounter for supervision of normal first pregnancy in second trimester    -  Primary    Relevant Orders    POCT urinalysis dipstick (Completed)      Patient Active Problem List   Diagnosis Date Noted  . Supervision of normal first pregnancy 03/21/2015   Objective:    BP 106/70 mmHg  Pulse 72  Wt 133 lb (60.328 kg)  LMP 12/27/2014 FHT: 155 BPM  Uterine Size: size equals dates     Assessment:    Pregnancy @ 6845w1d    Vertigo r/t nutrition and sleep deficits  Plan:    OBGCT: discussed. Signs and symptoms of preterm labor: discussed.  Labs, problem list reviewed and updated 2 hr GTT planned Follow up in 4 weeks.

## 2015-06-13 ENCOUNTER — Encounter: Payer: Medicaid Other | Admitting: Certified Nurse Midwife

## 2015-06-16 ENCOUNTER — Inpatient Hospital Stay (HOSPITAL_COMMUNITY)
Admission: AD | Admit: 2015-06-16 | Discharge: 2015-06-16 | Disposition: A | Discharge status: Home / Self Care | Payer: Medicaid Other | Source: Ambulatory Visit | Attending: Obstetrics | Admitting: Obstetrics

## 2015-06-16 ENCOUNTER — Encounter (HOSPITAL_COMMUNITY): Payer: Self-pay | Admitting: *Deleted

## 2015-06-16 DIAGNOSIS — O9989 Other specified diseases and conditions complicating pregnancy, childbirth and the puerperium: Secondary | ICD-10-CM | POA: Diagnosis not present

## 2015-06-16 DIAGNOSIS — J069 Acute upper respiratory infection, unspecified: Secondary | ICD-10-CM | POA: Diagnosis not present

## 2015-06-16 DIAGNOSIS — Z3A25 25 weeks gestation of pregnancy: Secondary | ICD-10-CM | POA: Diagnosis not present

## 2015-06-16 DIAGNOSIS — Z3A24 24 weeks gestation of pregnancy: Secondary | ICD-10-CM | POA: Insufficient documentation

## 2015-06-16 DIAGNOSIS — O26892 Other specified pregnancy related conditions, second trimester: Secondary | ICD-10-CM | POA: Insufficient documentation

## 2015-06-16 NOTE — MAU Note (Signed)
Patient presents to mau with c/o congestion x1 week with productive green cough; headache; chest pain when coughs. Denies Fever or chills. Has tried taking benadryl. Also reporting left foot pain that is sharp and comes and goes. +FM. Denies LOF or VB.

## 2015-06-16 NOTE — Discharge Instructions (Signed)
Upper Respiratory Infection, Adult Most upper respiratory infections (URIs) are a viral infection of the air passages leading to the lungs. A URI affects the nose, throat, and upper air passages. The most common type of URI is nasopharyngitis and is typically referred to as "the common cold." URIs run their course and usually go away on their own. Most of the time, a URI does not require medical attention, but sometimes a bacterial infection in the upper airways can follow a viral infection. This is called a secondary infection. Sinus and middle ear infections are common types of secondary upper respiratory infections. Bacterial pneumonia can also complicate a URI. A URI can worsen asthma and chronic obstructive pulmonary disease (COPD). Sometimes, these complications can require emergency medical care and may be life threatening.  CAUSES Almost all URIs are caused by viruses. A virus is a type of germ and can spread from one person to another.  RISKS FACTORS You may be at risk for a URI if:   You smoke.   You have chronic heart or lung disease.  You have a weakened defense (immune) system.   You are very young or very old.   You have nasal allergies or asthma.  You work in crowded or poorly ventilated areas.  You work in health care facilities or schools. SIGNS AND SYMPTOMS  Symptoms typically develop 2-3 days after you come in contact with a cold virus. Most viral URIs last 7-10 days. However, viral URIs from the influenza virus (flu virus) can last 14-18 days and are typically more severe. Symptoms may include:   Runny or stuffy (congested) nose.   Sneezing.   Cough.   Sore throat.   Headache.   Fatigue.   Fever.   Loss of appetite.   Pain in your forehead, behind your eyes, and over your cheekbones (sinus pain).  Muscle aches.  DIAGNOSIS  Your health care provider may diagnose a URI by:  Physical exam.  Tests to check that your symptoms are not due to  another condition such as:  Strep throat.  Sinusitis.  Pneumonia.  Asthma. TREATMENT  A URI goes away on its own with time. It cannot be cured with medicines, but medicines may be prescribed or recommended to relieve symptoms. Medicines may help:  Reduce your fever.  Reduce your cough.  Relieve nasal congestion. HOME CARE INSTRUCTIONS   Take medicines only as directed by your health care provider.   Gargle warm saltwater or take cough drops to comfort your throat as directed by your health care provider.  Use a warm mist humidifier or inhale steam from a shower to increase air moisture. This may make it easier to breathe.  Drink enough fluid to keep your urine clear or pale yellow.   Eat soups and other clear broths and maintain good nutrition.   Rest as needed.   Return to work when your temperature has returned to normal or as your health care provider advises. You may need to stay home longer to avoid infecting others. You can also use a face mask and careful hand washing to prevent spread of the virus.  Increase the usage of your inhaler if you have asthma.   Do not use any tobacco products, including cigarettes, chewing tobacco, or electronic cigarettes. If you need help quitting, ask your health care provider. PREVENTION  The best way to protect yourself from getting a cold is to practice good hygiene.   Avoid oral or hand contact with people with cold   symptoms.   Wash your hands often if contact occurs.  There is no clear evidence that vitamin C, vitamin E, echinacea, or exercise reduces the chance of developing a cold. However, it is always recommended to get plenty of rest, exercise, and practice good nutrition.  SEEK MEDICAL CARE IF:   You are getting worse rather than better.   Your symptoms are not controlled by medicine.   You have chills.  You have worsening shortness of breath.  You have brown or red mucus.  You have yellow or brown nasal  discharge.  You have pain in your face, especially when you bend forward.  You have a fever.  You have swollen neck glands.  You have pain while swallowing.  You have white areas in the back of your throat. SEEK IMMEDIATE MEDICAL CARE IF:   You have severe or persistent:  Headache.  Ear pain.  Sinus pain.  Chest pain.  You have chronic lung disease and any of the following:  Wheezing.  Prolonged cough.  Coughing up blood.  A change in your usual mucus.  You have a stiff neck.  You have changes in your:  Vision.  Hearing.  Thinking.  Mood. MAKE SURE YOU:   Understand these instructions.  Will watch your condition.  Will get help right away if you are not doing well or get worse.   This information is not intended to replace advice given to you by your health care provider. Make sure you discuss any questions you have with your health care provider.   Document Released: 07/17/2000 Document Revised: 06/07/2014 Document Reviewed: 04/28/2013 Elsevier Interactive Patient Education 2016 [employer]  

## 2015-06-16 NOTE — MAU Provider Note (Signed)
MAU HISTORY AND PHYSICAL  Chief Complaint:  Chest Pain; Headache; and Foot Pain   Evelette Harland DingwallSettle is a 20 y.o.  G1P0 with IUP at 6636w3d presenting for Chest Pain; Headache; and Foot Pain  Ill for about 5 days, worse 2 days ago. Initially sneezing, then cough, dry at night, productive during day. No sob or trouble breathing. Pain in chest when coughs, not at rest. Mild throat pain. No abdominal pain, ctxns, or bleeding. Has had intermittent headache this week, none today. No sick contacts. Slight improvement over past two days. No flu vaccine this pregnancy.     Past Medical History  Diagnosis Date  . Medical history non-contributory     Past Surgical History  Procedure Laterality Date  . No past surgeries      Family History  Problem Relation Age of Onset  . Diabetes Mother   . Hypertension Mother     Social History  Substance Use Topics  . Smoking status: Never Smoker   . Smokeless tobacco: Never Used  . Alcohol Use: No    Allergies  Allergen Reactions  . Penicillins Hives and Rash    Foamed from mouth Has patient had a PCN reaction causing immediate rash, facial/tongue/throat swelling, SOB or lightheadedness with hypotension: Yes Has patient had a PCN reaction causing severe rash involving mucus membranes or skin necrosis: No Has patient had a PCN reaction that required hospitalization No Has patient had a PCN reaction occurring within the last 10 years: No If all of the above answers are "NO", then may proceed with Cephalosporin use.     Prescriptions prior to admission  Medication Sig Dispense Refill Last Dose  . diphenhydrAMINE (BENADRYL) 25 MG tablet Take 25 mg by mouth every 6 (six) hours as needed for allergies.   Past Week at Unknown time  . Doxylamine-Pyridoxine (DICLEGIS) 10-10 MG TBEC Take 1 tablet with breakfast and lunch.  Take 2 tablets at bedtime. (Patient taking differently: Take 2 tablets by mouth at bedtime as needed (nausea). Take 1 tablet with  breakfast and lunch.  Take 2 tablets at bedtime.) 100 tablet 4 Past Month at Unknown time  . Prenatal Vit-Fe Phos-FA-Omega (VITAFOL GUMMIES) 3.33-0.333-34.8 MG CHEW Chew 3 tablets by mouth daily. 90 tablet 12 06/16/2015 at Unknown time    Review of Systems - Negative except for what is mentioned in HPI.  Physical Exam  Blood pressure 122/78, pulse 87, temperature 97.9 F (36.6 C), temperature source Oral, resp. rate 17, height 5\' 7"  (1.702 m), weight 132 lb (59.875 kg), last menstrual period 12/27/2014, SpO2 100 %. GENERAL: Well-developed, well-nourished female in no acute distress.  LUNGS: Clear to auscultation bilaterally. Normal rate HEART: Regular rate and rhythm. ABDOMEN: Soft, nontender, nondistended, gravid.  EXTREMITIES: Nontender, no edema, 2+ distal pulses. FHT:  150s Contractions: none   Labs: No results found for this or any previous visit (from the past 24 hour(s)).  Imaging Studies:  No results found.  Assessment: Rosielee Harland DingwallSettle is  20 y.o. G1P0 at 3136w3d presents with URI. Likely viral. Improving. Well-appearing, no tachypnea, no SOB, lungs clear, afebrile - do not think pneumonia. Given improving mild symptoms and this the tail end of flu season not testing for or empirically treating. Normal FHTs, no signs ptl.  Plan: - d/c home with respiratory infection return precautions and recommendations for which OTC cough/cold remedies safe during pregnancy  Silvano Bilisoah B Wouk 5/12/20175:17 PM

## 2015-06-28 ENCOUNTER — Ambulatory Visit (INDEPENDENT_AMBULATORY_CARE_PROVIDER_SITE_OTHER): Payer: Medicaid Other | Admitting: Certified Nurse Midwife

## 2015-06-28 VITALS — BP 114/77 | HR 74 | Temp 98.6°F | Wt 135.0 lb

## 2015-06-28 DIAGNOSIS — O26842 Uterine size-date discrepancy, second trimester: Secondary | ICD-10-CM

## 2015-06-28 DIAGNOSIS — Z3402 Encounter for supervision of normal first pregnancy, second trimester: Secondary | ICD-10-CM

## 2015-06-28 LAB — POCT URINALYSIS DIPSTICK
Bilirubin, UA: NEGATIVE
Blood, UA: NEGATIVE
GLUCOSE UA: NEGATIVE
Ketones, UA: NEGATIVE
Leukocytes, UA: NEGATIVE
Nitrite, UA: NEGATIVE
Protein, UA: NEGATIVE
UROBILINOGEN UA: NEGATIVE
pH, UA: 7

## 2015-06-28 NOTE — Progress Notes (Signed)
Subjective:    Ashley [name] is a 20 y.o. female being seen today for her obstetrical visit. She is at 8130w1d gestation. Patient reports: no complaints . Fetal movement: normal.  Problem List Items Addressed This Visit      Other   Supervision of normal first pregnancy - Primary   Relevant Orders   POCT urinalysis dipstick (Completed)     Patient Active Problem List   Diagnosis Date Noted  . Supervision of normal first pregnancy 03/21/2015   Objective:    BP 114/77 mmHg  Pulse 74  Temp(Src) 98.6 F (37 C)  Wt 135 lb (61.236 kg)  LMP 12/27/2014 FHT: 145 BPM  Uterine Size: 24 cm and size less than dates     Assessment:    Pregnancy @ 2830w1d    S<D Plan:    OBGCT: discussed and ordered for next visit. Signs and symptoms of preterm labor: discussed.  Labs, problem list reviewed and updated 2 hr GTT planned Follow up in 2 weeks.

## 2015-07-06 ENCOUNTER — Ambulatory Visit (HOSPITAL_COMMUNITY)
Admission: RE | Admit: 2015-07-06 | Discharge: 2015-07-06 | Disposition: A | Discharge status: Home / Self Care | Payer: Medicaid Other | Source: Ambulatory Visit | Attending: Certified Nurse Midwife | Admitting: Certified Nurse Midwife

## 2015-07-06 ENCOUNTER — Other Ambulatory Visit: Payer: Self-pay | Admitting: Certified Nurse Midwife

## 2015-07-06 DIAGNOSIS — O26842 Uterine size-date discrepancy, second trimester: Secondary | ICD-10-CM | POA: Insufficient documentation

## 2015-07-06 DIAGNOSIS — Z3A27 27 weeks gestation of pregnancy: Secondary | ICD-10-CM | POA: Diagnosis not present

## 2015-07-06 DIAGNOSIS — Z3689 Encounter for other specified antenatal screening: Secondary | ICD-10-CM

## 2015-07-11 ENCOUNTER — Other Ambulatory Visit: Payer: Self-pay | Admitting: Certified Nurse Midwife

## 2015-07-12 ENCOUNTER — Other Ambulatory Visit: Payer: Medicaid Other

## 2015-07-12 ENCOUNTER — Ambulatory Visit (INDEPENDENT_AMBULATORY_CARE_PROVIDER_SITE_OTHER): Payer: Medicaid Other | Admitting: Certified Nurse Midwife

## 2015-07-12 VITALS — BP 118/75 | HR 65 | Wt 139.0 lb

## 2015-07-12 DIAGNOSIS — F32A Depression, unspecified: Secondary | ICD-10-CM

## 2015-07-12 DIAGNOSIS — F329 Major depressive disorder, single episode, unspecified: Secondary | ICD-10-CM

## 2015-07-12 DIAGNOSIS — Z3403 Encounter for supervision of normal first pregnancy, third trimester: Secondary | ICD-10-CM

## 2015-07-12 LAB — POCT URINALYSIS DIPSTICK
BILIRUBIN UA: NEGATIVE
Blood, UA: NEGATIVE
GLUCOSE UA: NEGATIVE
KETONES UA: NEGATIVE
LEUKOCYTES UA: NEGATIVE
Nitrite, UA: NEGATIVE
Protein, UA: NEGATIVE
Spec Grav, UA: 1.01
Urobilinogen, UA: NEGATIVE
pH, UA: 7

## 2015-07-12 MED ORDER — SERTRALINE HCL 50 MG PO TABS: 50.0000 mg | ORAL_TABLET | Freq: Every day | ORAL | Status: DC

## 2015-07-12 NOTE — Progress Notes (Signed)
Subjective:    Tyteanna Harland DingwallSettle is a 20 y.o. female being seen today for her obstetrical visit. She is at 2441w1d gestation. Patient reports no bleeding, no contractions, no cramping, no leaking and depression. Fetal movement: normal.  Problem List Items Addressed This Visit    None    Visit Diagnoses    Encounter for supervision of normal first pregnancy in third trimester    -  Primary    Relevant Orders    POCT urinalysis dipstick    Glucose Tolerance, 2 Hours w/1 Hour    CBC    HIV antibody    RPR    Depression        Relevant Medications    sertraline (ZOLOFT) 50 MG tablet      Patient Active Problem List   Diagnosis Date Noted  . 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 GBS planning TDAP offered  Rhogam given for RH negative Pediatrician: discussed. Infant feeding: plans to breastfeed. Maternity leave: discussed. Cigarette smoking: never smoked. Orders Placed This Encounter  Procedures  . Glucose Tolerance, 2 Hours w/1 Hour  . CBC  . HIV antibody  . RPR  . POCT urinalysis dipstick   Meds ordered this encounter  Medications  . sertraline (ZOLOFT) 50 MG tablet    Sig: Take 1 tablet (50 mg total) by mouth daily.    Dispense:  30 tablet    Refill:  6   Follow up in 2 Weeks.

## 2015-07-13 ENCOUNTER — Other Ambulatory Visit: Payer: Self-pay | Admitting: *Deleted

## 2015-07-13 ENCOUNTER — Telehealth: Payer: Self-pay | Admitting: *Deleted

## 2015-07-13 DIAGNOSIS — A6009 Herpesviral infection of other urogenital tract: Secondary | ICD-10-CM

## 2015-07-13 LAB — CBC
Hematocrit: 33.9 % — ABNORMAL LOW (ref 34.0–46.6)
Hemoglobin: 11.1 g/dL (ref 11.1–15.9)
MCH: 30.1 pg (ref 26.6–33.0)
MCHC: 32.7 g/dL (ref 31.5–35.7)
MCV: 92 fL (ref 79–97)
Platelets: 199 10*3/uL (ref 150–379)
RBC: 3.69 x10E6/uL — ABNORMAL LOW (ref 3.77–5.28)
RDW: 14 % (ref 12.3–15.4)
WBC: 10.6 10*3/uL (ref 3.4–10.8)

## 2015-07-13 LAB — HIV ANTIBODY (ROUTINE TESTING W REFLEX): HIV Screen 4th Generation wRfx: NONREACTIVE

## 2015-07-13 LAB — GLUCOSE TOLERANCE, 2 HOURS W/ 1HR
Glucose, 1 hour: 130 mg/dL (ref 65–179)
Glucose, 2 hour: 99 mg/dL (ref 65–152)
Glucose, Fasting: 69 mg/dL (ref 65–91)

## 2015-07-13 LAB — RPR: RPR Ser Ql: NONREACTIVE

## 2015-07-13 MED ORDER — VALACYCLOVIR HCL 500 MG PO TABS: 500.0000 mg | ORAL_TABLET | Freq: Every day | ORAL | Status: DC

## 2015-07-13 NOTE — Telephone Encounter (Signed)
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 She was prescribed Zoloft instead- she does not want that at this time. Rx for Zoloft cancelled and Valtrex sent to patient's pharmacy as discussed.

## 2015-07-20 ENCOUNTER — Telehealth: Payer: Self-pay | Admitting: *Deleted

## 2015-07-20 NOTE — Telephone Encounter (Signed)
Patient has questions about traveling out of state at 34 weeks. Told patient it is not recommended that she travel anywhere at 35 weeks- but if she must go she is to drink plenty, stop every hour to get out to stretch legs and use bathroom. Take her records with her in case she should have an emergency.

## 2015-07-26 ENCOUNTER — Ambulatory Visit (INDEPENDENT_AMBULATORY_CARE_PROVIDER_SITE_OTHER): Payer: Medicaid Other | Admitting: Certified Nurse Midwife

## 2015-07-26 VITALS — BP 104/67 | HR 68 | Temp 98.5°F | Wt 142.0 lb

## 2015-07-26 DIAGNOSIS — Z3403 Encounter for supervision of normal first pregnancy, third trimester: Secondary | ICD-10-CM

## 2015-07-26 LAB — POCT URINALYSIS DIPSTICK
BILIRUBIN UA: NEGATIVE
Blood, UA: NEGATIVE
GLUCOSE UA: NEGATIVE
KETONES UA: NEGATIVE
LEUKOCYTES UA: NEGATIVE
Nitrite, UA: NEGATIVE
Protein, UA: NEGATIVE
SPEC GRAV UA: 1.015
Urobilinogen, UA: NEGATIVE
pH, UA: 8

## 2015-07-26 NOTE — Progress Notes (Signed)
Subjective:    Ashley [name] is a 20 y.o. female being seen today for her obstetrical visit. She is at 535w1d gestation. Patient reports no complaints. Fetal movement: normal.  Is planning a trip around 34 weeks, discussed getting a copy of her medical records prior to travel in case of emergency.  Patient verbalized understanding.    Problem List Items Addressed This Visit    None     Patient Active Problem List   Diagnosis Date Noted  . Supervision of normal first pregnancy 03/21/2015   Objective:    LMP 12/27/2014 FHT:   BPM  Uterine Size: size equals dates  Presentation: breech     Assessment:    Pregnancy @ 8635w1d weeks   Doing well  Plan:     labs reviewed, problem list updated Consent signed. GBS planning TDAP offered  Rhogam given for RH negative Pediatrician: discussed. Infant feeding: plans to breastfeed. Maternity leave: discussed. Cigarette smoking: never smoked. No orders of the defined types were placed in this encounter.   No orders of the defined types were placed in this encounter.   Follow up in 2 Weeks.

## 2015-08-09 ENCOUNTER — Ambulatory Visit (INDEPENDENT_AMBULATORY_CARE_PROVIDER_SITE_OTHER): Payer: Medicaid Other | Admitting: Certified Nurse Midwife

## 2015-08-09 VITALS — BP 118/77 | HR 74 | Wt 144.0 lb

## 2015-08-09 DIAGNOSIS — Z3403 Encounter for supervision of normal first pregnancy, third trimester: Secondary | ICD-10-CM

## 2015-08-09 LAB — POCT URINALYSIS DIPSTICK
BILIRUBIN UA: NEGATIVE
Blood, UA: NEGATIVE
Glucose, UA: NEGATIVE
Ketones, UA: NEGATIVE
NITRITE UA: NEGATIVE
PH UA: 8
PROTEIN UA: NEGATIVE
Spec Grav, UA: <=1.005
Urobilinogen, UA: NEGATIVE

## 2015-08-09 MED ORDER — VALACYCLOVIR HCL 500 MG PO TABS: 500.0000 mg | ORAL_TABLET | Freq: Every day | ORAL | Status: DC

## 2015-08-09 NOTE — Progress Notes (Signed)
Subjective:    Eryn Harland DingwallSettle is a 20 y.o. female being seen today for her obstetrical visit. She is at 143w1d gestation. Patient reports no complaints. Fetal movement: normal.  Problem List Items Addressed This Visit    None     Patient Active Problem List   Diagnosis Date Noted  . Supervision of normal first pregnancy 03/21/2015   Objective:    BP 118/77 mmHg  Pulse 74  Wt 144 lb (65.318 kg)  LMP 12/27/2014 FHT:  140 BPM  Uterine Size: size equals dates  Presentation: breech     Assessment:    Pregnancy @ 3043w1d weeks   Plan:    Paternity testing information given   Circumcision information given.    labs reviewed, problem list updated Consent signed. GBS planning TDAP offered  Rhogam given for RH negative Pediatrician: discussed. Infant feeding: plans to breastfeed. Maternity leave: discussed. Cigarette smoking: never smoked. No orders of the defined types were placed in this encounter.   Meds ordered this encounter  Medications  . valACYclovir (VALTREX) 500 MG tablet    Sig: Take 1 tablet (500 mg total) by mouth daily.    Dispense:  30 tablet    Refill:  5   Follow up in 2 Weeks.

## 2015-08-23 ENCOUNTER — Ambulatory Visit (INDEPENDENT_AMBULATORY_CARE_PROVIDER_SITE_OTHER): Payer: Medicaid Other | Admitting: Certified Nurse Midwife

## 2015-08-23 VITALS — BP 128/76 | HR 67 | Temp 98.4°F | Wt 145.0 lb

## 2015-08-23 DIAGNOSIS — Z3403 Encounter for supervision of normal first pregnancy, third trimester: Secondary | ICD-10-CM

## 2015-08-23 LAB — POCT URINALYSIS DIPSTICK
BILIRUBIN UA: NEGATIVE
Blood, UA: NEGATIVE
GLUCOSE UA: NEGATIVE
Ketones, UA: NEGATIVE
Leukocytes, UA: NEGATIVE
Nitrite, UA: NEGATIVE
Protein, UA: NEGATIVE
UROBILINOGEN UA: NEGATIVE
pH, UA: 7

## 2015-08-23 NOTE — Assessment & Plan Note (Signed)
  Clinic  [facility] Prenatal Labs  Dating  LMP Blood type: A/POS/-- (02/14 1059)   Genetic Screen 1 Screen:    AFP:  3/17: normal   Quad:     NIPS: Antibody:NEG (02/14 1059)  Anatomic US  05/04/15: normal Rubella: 5.13 (02/14 1059)  GTT Early:               Third trimester: Normal RPR: Non Reactive (06/07 1049)   Flu vaccine  HBsAg: NEGATIVE (02/14 1059)   TDaP vaccine                                               Rhogam: N/A HIV: Non Reactive (06/07 1049)   Baby Food                        Breast                       GBS: (For PCN allergy, check sensitivities)  Contraception  Pap:  Circumcision  [facility]   Pediatrician    Support Person

## 2015-08-23 NOTE — Progress Notes (Signed)
Patient reports she had some pressure and cramping Monday night. Patient reports she has been fine since then.

## 2015-08-23 NOTE — Progress Notes (Signed)
Subjective:    Ashley [name] is a 20 y.o. female being seen today for her obstetrical visit. She is at 4531w1d gestation. Patient reports no complaints. Fetal movement: normal.  Problem List Items Addressed This Visit    None    Visit Diagnoses    Encounter for supervision of normal first pregnancy in third trimester    -  Primary    Relevant Orders    POCT urinalysis dipstick (Completed)      Patient Active Problem List   Diagnosis Date Noted  . Supervision of normal first pregnancy 03/21/2015   Objective:    BP 128/76 mmHg  Pulse 67  Temp(Src) 98.4 F (36.9 C)  Wt 145 lb (65.772 kg)  LMP 12/27/2014 FHT:  145 BPM  Uterine Size: 30 cm and size less than dates  Presentation: cephalic     Assessment:    Pregnancy @ 7631w1d weeks   Doing well  S<D  Plan:     labs reviewed, problem list updated Consent signed. GBS planning TDAP offered  Rhogam given for RH negative Pediatrician: discussed. Infant feeding: plans to breastfeed. Maternity leave: discussed. Cigarette smoking: never smoked. Orders Placed This Encounter  Procedures  . POCT urinalysis dipstick   No orders of the defined types were placed in this encounter.   Follow up in 1 Week with GBS.

## 2015-08-29 ENCOUNTER — Ambulatory Visit (INDEPENDENT_AMBULATORY_CARE_PROVIDER_SITE_OTHER): Payer: Medicaid Other | Admitting: Certified Nurse Midwife

## 2015-08-29 VITALS — BP 132/84 | HR 93 | Temp 98.0°F | Wt 147.2 lb

## 2015-08-29 DIAGNOSIS — Z3403 Encounter for supervision of normal first pregnancy, third trimester: Secondary | ICD-10-CM

## 2015-08-29 LAB — POCT URINALYSIS DIPSTICK
Bilirubin, UA: NEGATIVE
Blood, UA: NEGATIVE
Glucose, UA: NEGATIVE
Ketones, UA: NEGATIVE
Nitrite, UA: NEGATIVE
PH UA: 6
SPEC GRAV UA: 1.015
UROBILINOGEN UA: NEGATIVE

## 2015-08-29 NOTE — Patient Instructions (Addendum)
Before [facility] Before your baby arrives it is important to:  Have all of the supplies that you will need to care for your baby.  Know where to go if there is an emergency.  Discuss the baby's arrival with other family members. WHAT SUPPLIES WILL I NEED? It is recommended that you have the following supplies: Large Items  Crib.  Crib mattress.  Rear-facing infant car seat. If possible, have a trained professional check to make sure that it is installed correctly. Feeding  6-8 bottles that are 4-5 oz in size.  6-8 nipples.  Bottle brush.  Sterilizer, or a large pan or kettle with a lid.  A way to boil and cool water.  If you will be breastfeeding:  Breast pump.  Nipple cream.  Nursing bra.  Breast pads.  Breast shields.  If you will be formula feeding:  Formula.  Measuring cups.  Measuring spoons. Bathing  Mild baby soap and baby shampoo.  Petroleum jelly.  Soft cloth towel and washcloth.  Hooded towel.  Cotton balls.  Bath basin. Other Supplies  Rectal thermometer.  Bulb syringe.  Baby wipes or washcloths for diaper changes.  Diaper bag.  Changing pad.  Clothing, including one-piece outfits and pajamas.  Baby nail clippers.  Receiving blankets.  Mattress pad and sheets for the crib.  Night-light for the baby's room.  Baby monitor.  2 or 3 pacifiers.  Either 24-36 cloth diapers and waterproof diaper covers or a box of disposable diapers. You may need to use as many as 10-12 diapers per day. HOW DO I PREPARE FOR AN EMERGENCY? Prepare for an emergency by:  Knowing how to get to the nearest hospital.  Listing the phone numbers of your baby's health care providers near your home phone and in your cell phone. HOW DO I PREPARE MY FAMILY?  Decide how to handle visitors.  If you have other children:  Talk with them about the baby coming home. Ask them how they feel about it.  Read a book together about being a new big  brother or sister.  Find ways to let them help you prepare for the new baby.  Have someone ready to care for them while you are in the hospital.   This information is not intended to replace advice given to you by your health care provider. Make sure you discuss any questions you have with your health care provider.   Document Released: 01/04/2008 Document Revised: 06/07/2014 Document Reviewed: 12/29/2013 Elsevier Interactive Patient Education 2016 [employer].  Preterm Labor Information Preterm labor is when labor starts before you are [redacted] weeks pregnant. The normal length of pregnancy is 39 to 41 weeks.  CAUSES  The cause of preterm labor is not often known. The most common known cause is infection. RISK FACTORS  Having a history of preterm labor.  Having your water break before it should.  Having a placenta that covers the opening of the cervix.  Having a placenta that breaks away from the uterus.  Having a cervix that is too weak to hold the baby in the uterus.  Having too much fluid in the amniotic sac.  Taking drugs or smoking while pregnant.  Not gaining enough weight while pregnant.  Being younger than 30 and older than 22 years old.  Having a low income.  Being African American. SYMPTOMS  Period-like cramps, belly (abdominal) pain, or back pain.  Contractions that are regular, as often as six in an hour. They may be  mild or painful.  Contractions that start at the top of the belly. They then move to the lower belly and back.  Lower belly pressure that seems to get stronger.  Bleeding from the vagina.  Fluid leaking from the vagina. TREATMENT  Treatment depends on:  Your condition.  The condition of your baby.  How many weeks pregnant you are. Your doctor may have you:  Take medicine to stop contractions.  Stay in bed except to use the restroom (bed rest).  Stay in the hospital. WHAT SHOULD YOU DO IF YOU THINK YOU ARE IN PRETERM LABOR? Call  your doctor right away. You need to go to the hospital right away.  HOW CAN YOU PREVENT PRETERM LABOR IN FUTURE PREGNANCIES?  Stop smoking, if you smoke.  Maintain healthy weight gain.  Do not take drugs or be around chemicals that are not needed.  Tell your doctor if you think you have an infection.  Tell your doctor if you had a preterm labor before.   This information is not intended to replace advice given to you by your health care provider. Make sure you discuss any questions you have with your health care provider.   Document Released: 04/19/2008 Document Revised: 06/07/2014 Document Reviewed: 02/24/2012 Elsevier Interactive Patient Education [employer]  Third Trimester of Pregnancy The third trimester is from week 29 through week 42, months 7 through 9. This trimester is when your unborn baby (fetus) is growing very fast. At the end of the ninth month, the unborn baby is about 20 inches in length. It weighs about 6-10 pounds.  HOME CARE   Avoid all smoking, herbs, and alcohol. Avoid drugs not approved by your doctor.  Do not use any tobacco products, including cigarettes, chewing tobacco, and electronic cigarettes. If you need help quitting, ask your doctor. You may get counseling or other support to help you quit.  Only take medicine as told by your doctor. Some medicines are safe and some are not during pregnancy.  Exercise only as told by your doctor. Stop exercising if you start having cramps.  Eat regular, healthy meals.  Wear a good support bra if your breasts are tender.  Do not use hot tubs, steam rooms, or saunas.  Wear your seat belt when driving.  Avoid raw meat, uncooked cheese, and liter boxes and soil used by cats.  Take your prenatal vitamins.  Take 1500-2000 milligrams of calcium daily starting at the 20th week of pregnancy until you deliver your baby.  Try taking medicine that helps you poop (stool softener) as needed, and if your doctor  approves. Eat more fiber by eating fresh fruit, vegetables, and whole grains. Drink enough fluids to keep your pee (urine) clear or pale yellow.  Take warm water baths (sitz baths) to soothe pain or discomfort caused by hemorrhoids. Use hemorrhoid cream if your doctor approves.  If you have puffy, bulging veins (varicose veins), wear support hose. Raise (elevate) your feet for 15 minutes, 3-4 times a day. Limit salt in your diet.  Avoid heavy lifting, wear low heels, and sit up straight.  Rest with your legs raised if you have leg cramps or low back pain.  Visit your dentist if you have not gone during your pregnancy. Use a soft toothbrush to brush your teeth. Be gentle when you floss.  You can have sex (intercourse) unless your doctor tells you not to.  Do not travel far distances unless you must. Only do so with your doctor's  approval.  Take prenatal classes.  Practice driving to the hospital.  Pack your hospital bag.  Prepare the baby's room.  Go to your doctor visits. GET HELP IF:  You are not sure if you are in labor or if your water has broken.  You are dizzy.  You have mild cramps or pressure in your lower belly (abdominal).  You have a nagging pain in your belly area.  You continue to feel sick to your stomach (nauseous), throw up (vomit), or have watery poop (diarrhea).  You have bad smelling fluid coming from your vagina.  You have pain with peeing (urination). GET HELP RIGHT AWAY IF:   You have a fever.  You are leaking fluid from your vagina.  You are spotting or bleeding from your vagina.  You have severe belly cramping or pain.  You lose or gain weight rapidly.  You have trouble catching your breath and have chest pain.  You notice sudden or extreme puffiness (swelling) of your face, hands, ankles, feet, or legs.  You have not felt the baby move in over an hour.  You have severe headaches that do not go away with medicine.  You have vision  changes.   This information is not intended to replace advice given to you by your health care provider. Make sure you discuss any questions you have with your health care provider.   Document Released: 04/17/2009 Document Revised: 02/11/2014 Document Reviewed: 03/24/2012 Elsevier Interactive Patient Education 2016 [employer].  Vaginal Delivery During delivery, your health care provider will help you give birth to your baby. During a vaginal delivery, you will work to push the baby out of your vagina. However, before you can push your baby out, a few things need to happen. The opening of your uterus (cervix) has to soften, thin out, and open up (dilate) all the way to 10 cm. Also, your baby has to move down from the uterus into your vagina.  SIGNS OF LABOR  Your health care provider will first need to make sure you are in labor. Signs of labor include:   Passing what is called the mucous plug before labor begins. This is a small amount of blood-stained mucus.  Having regular, painful uterine contractions.   The time between contractions gets shorter.   The discomfort and pain gradually get more intense.  Contraction pains get worse when walking and do not go away when resting.   Your cervix becomes thinner (effacement) and dilates. BEFORE THE DELIVERY Once you are in labor and admitted into the hospital or care center, your health care provider may do the following:   Perform a complete physical exam.  Review any complications related to pregnancy or labor.  Check your blood pressure, pulse, temperature, and heart rate (vital signs).   Determine if, and when, the rupture of amniotic membranes occurred.  Do a vaginal exam (using a sterile glove and lubricant) to determine:   The position (presentation) of the baby. Is the baby's head presenting first (vertex) in the birth canal (vagina), or are the feet or buttocks first (breech)?   The level (station) of the baby's  head within the birth canal.   The effacement and dilatation of the cervix.   An electronic fetal monitor is usually placed on your abdomen when you first arrive. This is used to monitor your contractions and the baby's heart rate.  When the monitor is on your abdomen (external fetal monitor), it can only pick up the frequency  and length of your contractions. It cannot tell the strength of your contractions.  If it becomes necessary for your health care provider to know exactly how strong your contractions are or to see exactly what the baby's heart rate is doing, an internal monitor may be inserted into your vagina and uterus. Your health care provider will discuss the benefits and risks of using an internal monitor and obtain your permission before inserting the device.  Continuous fetal monitoring may be needed if you have an epidural, are receiving certain medicines (such as oxytocin), or have pregnancy or labor complications.  An IV access tube may be placed into a vein in your arm to deliver fluids and medicines if necessary. THREE STAGES OF LABOR AND DELIVERY Normal labor and delivery is divided into three stages. First Stage This stage starts when you begin to contract regularly and your cervix begins to efface and dilate. It ends when your cervix is completely open (fully dilated). The first stage is the longest stage of labor and can last from 3 hours to 15 hours.  Several methods are available to help with labor pain. You and your health care provider will decide which option is best for you. Options include:   Opioid medicines. These are strong pain medicines that you can get through your IV tube or as a shot into your muscle. These medicines lessen pain but do not make it go away completely.  Epidural. A medicine is given through a thin tube that is inserted in your back. The medicine numbs the lower part of your body and prevents any pain in that area.  Paracervical pain  medicine. This is an injection of an anesthetic on each side of your cervix.   You may request natural childbirth, which does not involve the use of pain medicines or an epidural during labor and delivery. Instead, you will use other things, such as breathing exercises, to help cope with the pain. Second Stage The second stage of labor begins when your cervix is fully dilated at 10 cm. It continues until you push your baby down through the birth canal and the baby is born. This stage can take only minutes or several hours.  The location of your baby's head as it moves through the birth canal is reported as a number called a station. If the baby's head has not started its descent, the station is described as being at minus 3 (-3). When your baby's head is at the zero station, it is at the middle of the birth canal and is engaged in the pelvis. The station of your baby helps indicate the progress of the second stage of labor.  When your baby is born, your health care provider may hold the baby with his or her head lowered to prevent amniotic fluid, mucus, and blood from getting into the baby's lungs. The baby's mouth and nose may be suctioned with a small bulb syringe to remove any additional fluid.  Your health care provider may then place the baby on your stomach. It is important to keep the baby from getting cold. To do this, the health care provider will dry the baby off, place the baby directly on your skin (with no blankets between you and the baby), and cover the baby with warm, dry blankets.   The umbilical cord is cut. Third Stage During the third stage of labor, your health care provider will deliver the placenta (afterbirth) and make sure your bleeding is under control. The  delivery of the placenta usually takes about 5 minutes but can take up to 30 minutes. After the placenta is delivered, a medicine may be given either by IV or injection to help contract the uterus and control bleeding. If  you are planning to breastfeed, you can try to do so now. After you deliver the placenta, your uterus should contract and get very firm. If your uterus does not remain firm, your health care provider will massage it. This is important because the contraction of the uterus helps cut off bleeding at the site where the placenta was attached to your uterus. If your uterus does not contract properly and stay firm, you may continue to bleed heavily. If there is a lot of bleeding, medicines may be given to contract the uterus and stop the bleeding.    This information is not intended to replace advice given to you by your health care provider. Make sure you discuss any questions you have with your health care provider.   Document Released: 10/31/2007 Document Revised: 02/11/2014 Document Reviewed: 09/18/2011 Elsevier Interactive Patient Education [employer]

## 2015-08-29 NOTE — Progress Notes (Signed)
Subjective:    Ashley [name] is a 20 y.o. female being seen today for her obstetrical visit. She is at [redacted]w[redacted]d gestation. Patient reports no complaints. Fetal movement: normal.  Problem List Items Addressed This Visit      Other   Supervision of normal first pregnancy - Primary   Relevant Orders   Strep Gp B NAA   NuSwab VG+, Candida 6sp   POCT urinalysis dipstick (Completed)    Other Visit Diagnoses   None.    Patient Active Problem List   Diagnosis Date Noted  . Supervision of normal first pregnancy 03/21/2015   Objective:    BP 132/84   Pulse 93   Temp 98 F (36.7 C)   Wt 147 lb 3.2 oz (66.8 kg)   LMP 12/27/2014   BMI 23.05 kg/m  FHT:  145 BPM  Uterine Size: 33 cm and size equals dates  Presentation: cephalic   Cervix: long, thick, closed and posterior.  Ballotable.    Assessment:    Pregnancy @ [redacted]w[redacted]d weeks  Doing well.  Plan:     labs reviewed, problem list updated Consent signed. GBS sent TDAP offered  Rhogam given for RH negative Pediatrician: discussed. Infant feeding: plans to breastfeed. Maternity leave: discussed. Cigarette smoking: never smoked. Orders Placed This Encounter  Procedures  . Strep Gp B NAA  . POCT urinalysis dipstick   No orders of the defined types were placed in this encounter.  Follow up in 1 Weeks.

## 2015-08-31 LAB — STREP GP B NAA: Strep Gp B NAA: NEGATIVE

## 2015-09-02 LAB — NUSWAB VG+, CANDIDA 6SP
CANDIDA ALBICANS, NAA: NEGATIVE
CANDIDA GLABRATA, NAA: NEGATIVE
CANDIDA TROPICALIS, NAA: NEGATIVE
Candida krusei, NAA: NEGATIVE
Candida lusitaniae, NAA: NEGATIVE
Candida parapsilosis, NAA: NEGATIVE
Chlamydia trachomatis, NAA: NEGATIVE
NEISSERIA GONORRHOEAE, NAA: NEGATIVE
TRICH VAG BY NAA: NEGATIVE

## 2015-09-05 ENCOUNTER — Ambulatory Visit (INDEPENDENT_AMBULATORY_CARE_PROVIDER_SITE_OTHER): Payer: Medicaid Other | Admitting: Certified Nurse Midwife

## 2015-09-05 VITALS — BP 126/84 | HR 73 | Temp 98.4°F | Wt 150.4 lb

## 2015-09-05 DIAGNOSIS — Z3403 Encounter for supervision of normal first pregnancy, third trimester: Secondary | ICD-10-CM

## 2015-09-05 LAB — POCT URINALYSIS DIPSTICK
Bilirubin, UA: NEGATIVE
Glucose, UA: NEGATIVE
Ketones, UA: NEGATIVE
NITRITE UA: NEGATIVE
PH UA: 8
RBC UA: NEGATIVE
UROBILINOGEN UA: 0.2

## 2015-09-05 NOTE — Progress Notes (Signed)
Pt states everything is going well

## 2015-09-05 NOTE — Progress Notes (Signed)
Subjective:    Ashley [name] is a 20 y.o. female being seen today for her obstetrical visit. She is at [redacted]w[redacted]d gestation. Patient reports no complaints. Fetal movement: normal.  Problem List Items Addressed This Visit      Other   Supervision of normal first pregnancy - Primary    Other Visit Diagnoses   None.    Patient Active Problem List   Diagnosis Date Noted  . Supervision of normal first pregnancy 03/21/2015   Objective:    BP 126/84   Pulse 73   Temp 98.4 F (36.9 C)   Wt 150 lb 6.4 oz (68.2 kg)   LMP 12/27/2014   BMI 23.56 kg/m  FHT:  155 BPM  Uterine Size: 33 cm and size less than dates  Presentation: cephalic     Assessment:    Pregnancy @ [redacted]w[redacted]d weeks   Insomnia of pregnancy: will try Unisom OTC  Plan:     labs reviewed, problem list updated Consent signed. GBS sent TDAP offered  Rhogam given for RH negative Pediatrician: discussed. Infant feeding: plans to breastfeed. Maternity leave: discussed. Cigarette smoking: never smoked. No orders of the defined types were placed in this encounter.  No orders of the defined types were placed in this encounter.  Follow up in 1 Week.

## 2015-09-05 NOTE — Patient Instructions (Addendum)
Before [facility] Before your baby arrives it is important to:  Have all of the supplies that you will need to care for your baby.  Know where to go if there is an emergency.  Discuss the baby's arrival with other family members. WHAT SUPPLIES WILL I NEED? It is recommended that you have the following supplies: Large Items  Crib.  Crib mattress.  Rear-facing infant car seat. If possible, have a trained professional check to make sure that it is installed correctly. Feeding  6-8 bottles that are 4-5 oz in size.  6-8 nipples.  Bottle brush.  Sterilizer, or a large pan or kettle with a lid.  A way to boil and cool water.  If you will be breastfeeding:  Breast pump.  Nipple cream.  Nursing bra.  Breast pads.  Breast shields.  If you will be formula feeding:  Formula.  Measuring cups.  Measuring spoons. Bathing  Mild baby soap and baby shampoo.  Petroleum jelly.  Soft cloth towel and washcloth.  Hooded towel.  Cotton balls.  Bath basin. Other Supplies  Rectal thermometer.  Bulb syringe.  Baby wipes or washcloths for diaper changes.  Diaper bag.  Changing pad.  Clothing, including one-piece outfits and pajamas.  Baby nail clippers.  Receiving blankets.  Mattress pad and sheets for the crib.  Night-light for the baby's room.  Baby monitor.  2 or 3 pacifiers.  Either 24-36 cloth diapers and waterproof diaper covers or a box of disposable diapers. You may need to use as many as 10-12 diapers per day. HOW DO I PREPARE FOR AN EMERGENCY? Prepare for an emergency by:  Knowing how to get to the nearest hospital.  Listing the phone numbers of your baby's health care providers near your home phone and in your cell phone. HOW DO I PREPARE MY FAMILY?  Decide how to handle visitors.  If you have other children:  Talk with them about the baby coming home. Ask them how they feel about it.  Read a book together about being a new big  brother or sister.  Find ways to let them help you prepare for the new baby.  Have someone ready to care for them while you are in the hospital.   This information is not intended to replace advice given to you by your health care provider. Make sure you discuss any questions you have with your health care provider.   Document Released: 01/04/2008 Document Revised: 06/07/2014 Document Reviewed: 12/29/2013 Elsevier Interactive Patient Education 2016 [employer]  Pain Relief During Labor and Delivery Everyone experiences pain differently, but labor causes severe pain for many women. The amount of pain you experience during labor and delivery depends on your pain tolerance, contraction strength, and your baby's size and position. There are many ways to prepare for and deal with the pain, including:   Taking prenatal classes to learn about labor and delivery. The more informed you are, the less anxious and afraid you may be. This can help lessen the pain.  Taking pain-relieving medicine during labor and delivery.  Learning breathing and relaxation techniques.  Taking a shower or bath.  Getting massaged.  Changing positions.  Placing an ice pack on your back. Discuss your pain control options with your health care provider during your prenatal visits.  WHAT ARE THE TWO TYPES OF PAIN-RELIEVING MEDICINES? 1. Analgesics. These are medicines that decrease pain without total loss of feeling or muscle movement. 2. Anesthetics. These are medicines that block all  feeling, including pain. There can be minor side effects of both types, such as nausea, trouble concentrating, becoming sleepy, and lowering the heart rate of the baby. However, health care providers are careful to give doses that will not seriously affect the baby.  WHAT ARE THE SPECIFIC TYPES OF ANALGESICS AND ANESTHETICS? Systemic Analgesic Systemic pain medicines affect your whole body rather than focusing pain relief on the area  of your body experiencing pain. This type of medicine is given either through an IV tube in your vein or by a shot (injection) into your muscle. This medicine will lessen your pain but will not stop it completely. It may also make you sleepy, but it will not make you lose consciousness.  Local Anesthetic Local anesthetic isused tonumb a small area of your body. The medicine is injected into the area of nerves that carry feeling to the vagina, vulva, or the area between the vagina and anus (perineum).  General Anesthetic This type of medicine causes you to lose consciousness so you do not feel pain. It is usually used only in emergency situations during labor. It is given through an IV tube or face mask. Paracervical Block A paracervical block is a form of local anesthesia given during labor. Numbing medicine is injected into the right and left sides of the cervix and vagina. It helps to lessen the pain caused by contractions and stretching of the cervix. It may have to be given more than once.  Pudendal Block A pudendal block is another form of local anesthesia. It is used to relieve the pain associated with pushing or stretching of the perineum at the time of delivery. An injection is given deep through the vaginal wall into the pudendal nerve in the pelvis, numbing the perineum.  Epidural Anesthetic An epidural is an injection of numbing medicine given in the lower back and into the epidural space near your spinal cord. The epidural numbs the lower half of your body. You may be able to move your legs but will not be allowed to walk. Epidurals can be used for labor, delivery, or cesarean deliveries.  To prevent the medicine from wearing off, a small tube (catheter) may be threaded into the epidural space and taped in place to prevent it from slipping out. Medicine can then be given continuously in small doses through the tube until you deliver. Spinal Block A spinal block is similar to an epidural,  but the medicine is injected into the spinal fluid, not the epidural space. A spinal block is only given once. It starts to relieve pain quickly but lasts only 1-2 hours. Spinal blocks can also be used for cesarean deliveries.  Combined Spinal-Epidural Block Combined spinal-epidural blocks combine the benefits of both the spinal and epidural blocks. The spinal part acts quickly to relieve pain and the epidural provides continuous pain relief. Hydrotherapy Immersion in warm water during labor may provide comfort and relaxation. It may also help to lessen pain, the use of anesthesia, and the length of labor. However, immersion in water during the delivery (water birth) may have some risk involved and studies to determine safety and risks are ongoing. If you are a healthy woman who is expecting an uncomplicated birth, talk with your health care provider to see if water birth is an option for you.    This information is not intended to replace advice given to you by your health care provider. Make sure you discuss any questions you have with your health care provider.  Document Released: 05/09/2008 Document Revised: 01/26/2013 Document Reviewed: 06/11/2012 Elsevier Interactive Patient Education [employer] Third Trimester of Pregnancy The third trimester is from week 29 through week 42, months 7 through 9. The third trimester is a time when the fetus is growing rapidly. At the end of the ninth month, the fetus is about 20 inches in length and weighs 6-10 pounds.  BODY CHANGES Your body goes through many changes during pregnancy. The changes vary from woman to woman.   Your weight will continue to increase. You can expect to gain 25-35 pounds (11-16 kg) by the end of the pregnancy.  You may begin to get stretch marks on your hips, abdomen, and breasts.  You may urinate more often because the fetus is moving lower into your pelvis and pressing on your bladder.  You may develop or continue to  have heartburn as a result of your pregnancy.  You may develop constipation because certain hormones are causing the muscles that push waste through your intestines to slow down.  You may develop hemorrhoids or swollen, bulging veins (varicose veins).  You may have pelvic pain because of the weight gain and pregnancy hormones relaxing your joints between the bones in your pelvis. Backaches may result from overexertion of the muscles supporting your posture.  You may have changes in your hair. These can include thickening of your hair, rapid growth, and changes in texture. Some women also have hair loss during or after pregnancy, or hair that feels dry or thin. Your hair will most likely return to normal after your baby is born.  Your breasts will continue to grow and be tender. A yellow discharge may leak from your breasts called colostrum.  Your belly button may stick out.  You may feel short of breath because of your expanding uterus.  You may notice the fetus "dropping," or moving lower in your abdomen.  You may have a bloody mucus discharge. This usually occurs a few days to a week before labor begins.  Your cervix becomes thin and soft (effaced) near your due date. WHAT TO EXPECT AT YOUR PRENATAL EXAMS  You will have prenatal exams every 2 weeks until week 36. Then, you will have weekly prenatal exams. During a routine prenatal visit:  You will be weighed to make sure you and the fetus are growing normally.  Your blood pressure is taken.  Your abdomen will be measured to track your baby's growth.  The fetal heartbeat will be listened to.  Any test results from the previous visit will be discussed.  You may have a cervical check near your due date to see if you have effaced. At around 36 weeks, your caregiver will check your cervix. At the same time, your caregiver will also perform a test on the secretions of the vaginal tissue. This test is to determine if a type of bacteria,  Group B streptococcus, is present. Your caregiver will explain this further. Your caregiver may ask you:  What your birth plan is.  How you are feeling.  If you are feeling the baby move.  If you have had any abnormal symptoms, such as leaking fluid, bleeding, severe headaches, or abdominal cramping.  If you are using any tobacco products, including cigarettes, chewing tobacco, and electronic cigarettes.  If you have any questions. Other tests or screenings that may be performed during your third trimester include:  Blood tests that check for low iron levels (anemia).  Fetal testing to check the health, activity  level, and growth of the fetus. Testing is done if you have certain medical conditions or if there are problems during the pregnancy.  HIV (human immunodeficiency virus) testing. If you are at high risk, you may be screened for HIV during your third trimester of pregnancy. FALSE LABOR You may feel small, irregular contractions that eventually go away. These are called Braxton Hicks contractions, or false labor. Contractions may last for hours, days, or even weeks before true labor sets in. If contractions come at regular intervals, intensify, or become painful, it is best to be seen by your caregiver.  SIGNS OF LABOR   Menstrual-like cramps.  Contractions that are 5 minutes apart or less.  Contractions that start on the top of the uterus and spread down to the lower abdomen and back.  A sense of increased pelvic pressure or back pain.  A watery or bloody mucus discharge that comes from the vagina. If you have any of these signs before the 37th week of pregnancy, call your caregiver right away. You need to go to the hospital to get checked immediately. HOME CARE INSTRUCTIONS   Avoid all smoking, herbs, alcohol, and unprescribed drugs. These chemicals affect the formation and growth of the baby.  Do not use any tobacco products, including cigarettes, chewing tobacco, and  electronic cigarettes. If you need help quitting, ask your health care provider. You may receive counseling support and other resources to help you quit.  Follow your caregiver's instructions regarding medicine use. There are medicines that are either safe or unsafe to take during pregnancy.  Exercise only as directed by your caregiver. Experiencing uterine cramps is a good sign to stop exercising.  Continue to eat regular, healthy meals.  Wear a good support bra for breast tenderness.  Do not use hot tubs, steam rooms, or saunas.  Wear your seat belt at all times when driving.  Avoid raw meat, uncooked cheese, cat litter boxes, and soil used by cats. These carry germs that can cause birth defects in the baby.  Take your prenatal vitamins.  Take 1500-2000 mg of calcium daily starting at the 20th week of pregnancy until you deliver your baby.  Try taking a stool softener (if your caregiver approves) if you develop constipation. Eat more high-fiber foods, such as fresh vegetables or fruit and whole grains. Drink plenty of fluids to keep your urine clear or pale yellow.  Take warm sitz baths to soothe any pain or discomfort caused by hemorrhoids. Use hemorrhoid cream if your caregiver approves.  If you develop varicose veins, wear support hose. Elevate your feet for 15 minutes, 3-4 times a day. Limit salt in your diet.  Avoid heavy lifting, wear low heal shoes, and practice good posture.  Rest a lot with your legs elevated if you have leg cramps or low back pain.  Visit your dentist if you have not gone during your pregnancy. Use a soft toothbrush to brush your teeth and be gentle when you floss.  A sexual relationship may be continued unless your caregiver directs you otherwise.  Do not travel far distances unless it is absolutely necessary and only with the approval of your caregiver.  Take prenatal classes to understand, practice, and ask questions about the labor and  delivery.  Make a trial run to the hospital.  Pack your hospital bag.  Prepare the baby's nursery.  Continue to go to all your prenatal visits as directed by your caregiver. SEEK MEDICAL CARE IF:  You are unsure if  you are in labor or if your water has broken.  You have dizziness.  You have mild pelvic cramps, pelvic pressure, or nagging pain in your abdominal area.  You have persistent nausea, vomiting, or diarrhea.  You have a bad smelling vaginal discharge.  You have pain with urination. SEEK IMMEDIATE MEDICAL CARE IF:   You have a fever.  You are leaking fluid from your vagina.  You have spotting or bleeding from your vagina.  You have severe abdominal cramping or pain.  You have rapid weight loss or gain.  You have shortness of breath with chest pain.  You notice sudden or extreme swelling of your face, hands, ankles, feet, or legs.  You have not felt your baby move in over an hour.  You have severe headaches that do not go away with medicine.  You have vision changes.   This information is not intended to replace advice given to you by your health care provider. Make sure you discuss any questions you have with your health care provider.   Document Released: 01/15/2001 Document Revised: 02/11/2014 Document Reviewed: 03/24/2012 Elsevier Interactive Patient Education [employer]

## 2015-09-08 ENCOUNTER — Inpatient Hospital Stay (HOSPITAL_COMMUNITY)
Admission: AD | Admit: 2015-09-08 | Discharge: 2015-09-08 | Disposition: A | Discharge status: Home / Self Care | Payer: Medicaid Other | Source: Ambulatory Visit | Attending: Obstetrics & Gynecology | Admitting: Obstetrics & Gynecology

## 2015-09-08 ENCOUNTER — Encounter (HOSPITAL_COMMUNITY): Payer: Self-pay

## 2015-09-08 DIAGNOSIS — N760 Acute vaginitis: Secondary | ICD-10-CM

## 2015-09-08 DIAGNOSIS — Z880 Allergy status to penicillin: Secondary | ICD-10-CM | POA: Insufficient documentation

## 2015-09-08 DIAGNOSIS — O26893 Other specified pregnancy related conditions, third trimester: Secondary | ICD-10-CM | POA: Insufficient documentation

## 2015-09-08 DIAGNOSIS — R1012 Left upper quadrant pain: Secondary | ICD-10-CM | POA: Insufficient documentation

## 2015-09-08 DIAGNOSIS — R51 Headache: Secondary | ICD-10-CM | POA: Insufficient documentation

## 2015-09-08 DIAGNOSIS — O4703 False labor before 37 completed weeks of gestation, third trimester: Secondary | ICD-10-CM

## 2015-09-08 DIAGNOSIS — B9689 Other specified bacterial agents as the cause of diseases classified elsewhere: Secondary | ICD-10-CM

## 2015-09-08 DIAGNOSIS — Z3A36 36 weeks gestation of pregnancy: Secondary | ICD-10-CM | POA: Diagnosis not present

## 2015-09-08 DIAGNOSIS — Z79899 Other long term (current) drug therapy: Secondary | ICD-10-CM | POA: Diagnosis not present

## 2015-09-08 LAB — URINALYSIS, ROUTINE W REFLEX MICROSCOPIC
Bilirubin Urine: NEGATIVE
GLUCOSE, UA: NEGATIVE mg/dL
HGB URINE DIPSTICK: NEGATIVE
KETONES UR: NEGATIVE mg/dL
Nitrite: NEGATIVE
PROTEIN: NEGATIVE mg/dL
Specific Gravity, Urine: 1.02 (ref 1.005–1.030)
pH: 8 (ref 5.0–8.0)

## 2015-09-08 LAB — COMPREHENSIVE METABOLIC PANEL
ALK PHOS: 96 U/L (ref 38–126)
ALT: 17 U/L (ref 14–54)
AST: 22 U/L (ref 15–41)
Albumin: 3 g/dL — ABNORMAL LOW (ref 3.5–5.0)
Anion gap: 6 (ref 5–15)
BILIRUBIN TOTAL: 0.5 mg/dL (ref 0.3–1.2)
BUN: 5 mg/dL — AB (ref 6–20)
CO2: 22 mmol/L (ref 22–32)
Calcium: 8.7 mg/dL — ABNORMAL LOW (ref 8.9–10.3)
Chloride: 107 mmol/L (ref 101–111)
Creatinine, Ser: 0.59 mg/dL (ref 0.44–1.00)
Glucose, Bld: 79 mg/dL (ref 65–99)
POTASSIUM: 3.6 mmol/L (ref 3.5–5.1)
Sodium: 135 mmol/L (ref 135–145)
TOTAL PROTEIN: 6.4 g/dL — AB (ref 6.5–8.1)

## 2015-09-08 LAB — URINE MICROSCOPIC-ADD ON

## 2015-09-08 LAB — CBC
HEMATOCRIT: 31.4 % — AB (ref 36.0–46.0)
HEMOGLOBIN: 10.5 g/dL — AB (ref 12.0–15.0)
MCH: 30.3 pg (ref 26.0–34.0)
MCHC: 33.4 g/dL (ref 30.0–36.0)
MCV: 90.5 fL (ref 78.0–100.0)
Platelets: 151 10*3/uL (ref 150–400)
RBC: 3.47 MIL/uL — AB (ref 3.87–5.11)
RDW: 14.2 % (ref 11.5–15.5)
WBC: 9.8 10*3/uL (ref 4.0–10.5)

## 2015-09-08 LAB — LIPASE, BLOOD: LIPASE: 22 U/L (ref 11–51)

## 2015-09-08 LAB — WET PREP, GENITAL
Sperm: NONE SEEN
TRICH WET PREP: NONE SEEN
Yeast Wet Prep HPF POC: NONE SEEN

## 2015-09-08 LAB — AMYLASE: AMYLASE: 64 U/L (ref 28–100)

## 2015-09-08 LAB — OB RESULTS CONSOLE GC/CHLAMYDIA: Gonorrhea: NEGATIVE

## 2015-09-08 MED ORDER — METRONIDAZOLE 0.75 % VA GEL: 1.0000 | Freq: Two times a day (BID) | VAGINAL | 0 refills | Status: DC

## 2015-09-08 MED ORDER — ACETAMINOPHEN 325 MG PO TABS
650.0000 mg | ORAL_TABLET | Freq: Once | ORAL | Status: AC
  Administered 2015-09-08: 650 mg via ORAL
  Filled 2015-09-08: qty 2

## 2015-09-08 MED ORDER — LACTATED RINGERS IV BOLUS (SEPSIS)
1000.0000 mL | Freq: Once | INTRAVENOUS | Status: AC
  Administered 2015-09-08: 1000 mL via INTRAVENOUS

## 2015-09-08 MED ORDER — RANITIDINE HCL 150 MG PO TABS: 150.0000 mg | ORAL_TABLET | Freq: Two times a day (BID) | ORAL | 0 refills | Status: DC

## 2015-09-08 MED ORDER — GI COCKTAIL ~~LOC~~
30.0000 mL | Freq: Once | ORAL | Status: AC
  Administered 2015-09-08: 30 mL via ORAL
  Filled 2015-09-08: qty 30

## 2015-09-08 MED ORDER — METRONIDAZOLE 0.75 % VA GEL: 1.0000 | Freq: Every day | VAGINAL | 0 refills | Status: DC

## 2015-09-08 NOTE — MAU Note (Signed)
Onset of left side headache since yesterday, did not take anything for this, epigastric pain has been there for some time however getting worse now radiating through to her back, denies vaginal bleeding, urinary frequency but no burning, denies constipation, positive fetal movement.

## 2015-09-08 NOTE — MAU Provider Note (Signed)
  History     CSN: [id number]  Arrival date and time: 09/08/15 1102   None     Chief Complaint  Patient presents with  . Headache  . Abdominal Pain   HPI Ashley [name] is a 19yo G1P0 at [redacted]w[redacted]d presenting with epigastric/LUQ pain radiating to her back and left sided h/a. She has had epigastric pain throughout this pregnancy but it has worsened in the last 24 hrs. The pain is worse when she lays down, describes it as "burning." She has not tried any medicines. She also endorses urinary frequency, nausea, and trouble sleeping.   Denies visual changes, blurry vision, edema, fever, chills. + fetal movement, no ctxs, LOF, or VB.  Up to date on prenatal care. Pregnancy has been uncomplicated.   Past Medical History:  Diagnosis Date  . Medical history non-contributory     Past Surgical History:  Procedure Laterality Date  . NO PAST SURGERIES      Family History  Problem Relation Age of Onset  . Diabetes Mother   . Hypertension Mother     Social History  Substance Use Topics  . Smoking status: Never Smoker  . Smokeless tobacco: Never Used  . Alcohol use No    Allergies:  Allergies  Allergen Reactions  . Penicillins Hives and Rash    Foamed from mouth Has patient had a PCN reaction causing immediate rash, facial/tongue/throat swelling, SOB or lightheadedness with hypotension: Yes Has patient had a PCN reaction causing severe rash involving mucus membranes or skin necrosis: No Has patient had a PCN reaction that required hospitalization No Has patient had a PCN reaction occurring within the last 10 years: No If all of the above answers are "NO", then may proceed with Cephalosporin use.     Prescriptions Prior to Admission  Medication Sig Dispense Refill Last Dose  . diphenhydrAMINE (BENADRYL) 25 MG tablet Take 25 mg by mouth at bedtime as needed for allergies.    Past Week at Unknown time  . Prenatal Vit-Fe Fumarate-FA (PRENATAL MULTIVITAMIN) TABS tablet Take 1 tablet by  mouth daily.   09/08/2015 at Unknown time  . valACYclovir (VALTREX) 500 MG tablet Take 1 tablet (500 mg total) by mouth daily. 30 tablet 5 09/08/2015 at Unknown time  . Doxylamine-Pyridoxine (DICLEGIS) 10-10 MG TBEC Take 1 tablet with breakfast and lunch.  Take 2 tablets at bedtime. (Patient not taking: Reported on 08/23/2015) 100 tablet 4 Not Taking    Review of Systems  Constitutional: Negative for chills and fever.  Eyes: Negative for blurred vision and photophobia.  Cardiovascular: Negative for leg swelling.  Gastrointestinal: Positive for abdominal pain, heartburn and nausea.  Genitourinary: Positive for frequency. Negative for dysuria and hematuria.  Neurological: Positive for headaches.   Physical Exam   Blood pressure 120/81, pulse 72, temperature 98.1 F (36.7 C), temperature source Oral, resp. rate 18, height 5\' 8"  (1.727 m), last menstrual period 12/27/2014.  Physical Exam  Constitutional: She appears well-developed and well-nourished.  Cardiovascular: Normal rate, regular rhythm and normal heart sounds.   Respiratory: Effort normal and breath sounds normal.  GI: There is tenderness in the epigastric area and left upper quadrant.  Musculoskeletal: She exhibits no edema.  Neurological: She is alert.    MAU Course  Procedures  MDM   Assessment and Plan  Ashley [name] is a 19yo G1P0 at [redacted]w[redacted]d presenting with epigastric/LUQ pain radiating to her back and left sided h/a.    Ashley [name] 09/08/2015, 12:34 PM

## 2015-09-08 NOTE — MAU Provider Note (Signed)
History     CSN: [id number]  Arrival date and time: 09/08/15 1102     Chief Complaint  Patient presents with  . Headache  . Abdominal Pain   HPI  Ashley [name] is a 19yo G1P0 at [redacted]w[redacted]d presenting with epigastric/LUQ pain radiating to her back, and c/o mild left-sided headache. She has had epigastric pain throughout this pregnancy but it has worsened in the last 24 hrs. The pain is worse when she lays down, describes it as "burning." She states it comes/goes. Starts mid-epigastric, wraps around left side to back, feels occasional tightening of the abdomen. Not very strong pain. She has not tried any medicines. She also states urinary frequency, nausea, and trouble sleeping.   Denies visual changes, blurry vision, edema, fever, chills. Good fetal movement, denies LOF or VB.    OB History    Gravida Para Term Preterm AB Living   1             SAB TAB Ectopic Multiple Live Births                  Past Medical History:  Diagnosis Date  . Medical history non-contributory     Past Surgical History:  Procedure Laterality Date  . NO PAST SURGERIES      Family History  Problem Relation Age of Onset  . Diabetes Mother   . Hypertension Mother     Social History  Substance Use Topics  . Smoking status: Never Smoker  . Smokeless tobacco: Never Used  . Alcohol use No    Allergies:  Allergies  Allergen Reactions  . Penicillins Hives and Rash    Foamed from mouth Has patient had a PCN reaction causing immediate rash, facial/tongue/throat swelling, SOB or lightheadedness with hypotension: Yes Has patient had a PCN reaction causing severe rash involving mucus membranes or skin necrosis: No Has patient had a PCN reaction that required hospitalization No Has patient had a PCN reaction occurring within the last 10 years: No If all of the above answers are "NO", then may proceed with Cephalosporin use.     Prescriptions Prior to Admission  Medication Sig Dispense Refill  Last Dose  . diphenhydrAMINE (BENADRYL) 25 MG tablet Take 25 mg by mouth at bedtime as needed for allergies.    Past Week at Unknown time  . Prenatal Vit-Fe Fumarate-FA (PRENATAL MULTIVITAMIN) TABS tablet Take 1 tablet by mouth daily.   09/08/2015 at Unknown time  . valACYclovir (VALTREX) 500 MG tablet Take 1 tablet (500 mg total) by mouth daily. 30 tablet 5 09/08/2015 at Unknown time  . Doxylamine-Pyridoxine (DICLEGIS) 10-10 MG TBEC Take 1 tablet with breakfast and lunch.  Take 2 tablets at bedtime. (Patient not taking: Reported on 08/23/2015) 100 tablet 4 Not Taking    ROS  Review of Systems - Psychological ROS: negative Ophthalmic ROS: negative for - blurry vision, decreased vision, double vision or photophobia Respiratory ROS: no cough, shortness of breath, or wheezing Cardiovascular ROS: no chest pain or dyspnea on exertion Gastrointestinal ROS: positive for - abdominal pain Genito-Urinary ROS: positive for - urinary frequency/urgency negative for - dysuria or vulvar/vaginal symptoms Musculoskeletal ROS: negative Neurological ROS: positive for - headaches negative for - dizziness, visual changes or weakness Physical Exam   Blood pressure 120/81, pulse 72, temperature 98.1 F (36.7 C), temperature source Oral, resp. rate 18, height 5\' 8"  (1.727 m), last menstrual period 12/27/2014.  Physical Exam  Constitutional: She is oriented to person, place, and time. She appears  well-developed and well-nourished.  HEENT: Non-icteric; EOMI; Normocephalic.  Cardiovascular: Normal rate, regular rhythm and normal heart sounds, no murmurs, rubs, gallops.  Pulmonary/Chest: Effort normal and breath sounds normal. CTAB. Abdominal: Soft. Mild tenderness in epigastric region. Gravid. Neurological: She is alert and oriented to person, place, and time. She has normal reflexes.  Skin: Skin is warm and dry.  Musculoskeletal: No edema. Steady gait.   Psychiatric: She has a normal mood and affect. Her behavior  is normal. Judgment and thought content normal.  SVE: Closed, external os about 3cm dilated, 60% effaced, -2 station.   MAU Course  Procedures NST: reactive, 120, mod var, +accels, no decels. CTX q10-15 min, irregular. CMP/Amylase/Lipase/CBC- WNL Wet prep: +bacterial vaginosis, neg for yeast/trich Gc/Chl  MDM Plan of care reviewed with patient, including labs and tests ordered and medical treatment. Patient did not make any cervical change (fingertip, 50-60%, station is high) during MAU stay, contractions spaced out with IVF. BV found on exam, will be treating. Preterm labor precautions given.   Assessment and Plan  20 y/o G1P0 at 65 6/7 weeks here for preterm contractions, epigastric pain, headache.  #Contractions (barely feeling) - decreased with IVF, discussed PO hydration - +BV, treating with metrogel - preterm labor precautions given - No cervical change during MAU visit  #Epigastric pain - resolved with GI cocktail, sent home with Zantac  #Headache - Tylenol, resolved, discussed treatment options at home.     Jen Mow, DO OB Fellow 09/08/2015, 2:20 PM

## 2015-09-08 NOTE — Discharge Instructions (Signed)
Preterm Labor Information Preterm labor is when labor starts at less than 37 weeks of pregnancy. The normal length of a pregnancy is 39 to 41 weeks. CAUSES Often, there is no identifiable underlying cause as to why a woman goes into preterm labor. One of the most common known causes of preterm labor is infection. Infections of the uterus, cervix, vagina, amniotic sac, bladder, kidney, or even the lungs (pneumonia) can cause labor to start. Other suspected causes of preterm labor include:   Urogenital infections, such as yeast infections and bacterial vaginosis.   Uterine abnormalities (uterine shape, uterine septum, fibroids, or bleeding from the placenta).   A cervix that has been operated on (it may fail to stay closed).   Malformations in the fetus.   Multiple gestations (twins, triplets, and so on).   Breakage of the amniotic sac.  RISK FACTORS  Having a previous history of preterm labor.   Having premature rupture of membranes (PROM).   Having a placenta that covers the opening of the cervix (placenta previa).   Having a placenta that separates from the uterus (placental abruption).   Having a cervix that is too weak to hold the fetus in the uterus (incompetent cervix).   Having too much fluid in the amniotic sac (polyhydramnios).   Taking illegal drugs or smoking while pregnant.   Not gaining enough weight while pregnant.   Being younger than 76 and older than 22 years old.   Having a low socioeconomic status.   Being African American. SYMPTOMS Signs and symptoms of preterm labor include:   Menstrual-like cramps, abdominal pain, or back pain.  Uterine contractions that are regular, as frequent as six in an hour, regardless of their intensity (may be mild or painful).  Contractions that start on the top of the uterus and spread down to the lower abdomen and back.   A sense of increased pelvic pressure.   A watery or bloody mucus discharge that  comes from the vagina.  TREATMENT Depending on the length of the pregnancy and other circumstances, your health care provider may suggest bed rest. If necessary, there are medicines that can be given to stop contractions and to mature the fetal lungs. If labor happens before 34 weeks of pregnancy, a prolonged hospital stay may be recommended. Treatment depends on the condition of both you and the fetus.  WHAT SHOULD YOU DO IF YOU THINK YOU ARE IN PRETERM LABOR? Call your health care provider right away. You will need to go to the hospital to get checked immediately. HOW CAN YOU PREVENT PRETERM LABOR IN FUTURE PREGNANCIES? You should:   Stop smoking if you smoke.  Maintain healthy weight gain and avoid chemicals and drugs that are not necessary.  Be watchful for any type of infection.  Inform your health care provider if you have a known history of preterm labor.   This information is not intended to replace advice given to you by your health care provider. Make sure you discuss any questions you have with your health care provider.   Document Released: 04/13/2003 Document Revised: 09/23/2012 Document Reviewed: 02/24/2012 Elsevier Interactive Patient Education 2016 Elsevier Inc. Bacterial Vaginosis Bacterial vaginosis is a vaginal infection that occurs when the normal balance of bacteria in the vagina is disrupted. It results from an overgrowth of certain bacteria. This is the most common vaginal infection in women of childbearing age. Treatment is important to prevent complications, especially in pregnant women, as it can cause a premature delivery. CAUSES  Bacterial vaginosis is caused by an increase in harmful bacteria that are normally present in smaller amounts in the vagina. Several different kinds of bacteria can cause bacterial vaginosis. However, the reason that the condition develops is not fully understood. RISK FACTORS Certain activities or behaviors can put you at an increased  risk of developing bacterial vaginosis, including:  Having a new sex partner or multiple sex partners.  Douching.  Using an intrauterine device (IUD) for contraception. Women do not get bacterial vaginosis from toilet seats, bedding, swimming pools, or contact with objects around them. SIGNS AND SYMPTOMS  Some women with bacterial vaginosis have no signs or symptoms. Common symptoms include:  Grey vaginal discharge.  A fishlike odor with discharge, especially after sexual intercourse.  Itching or burning of the vagina and vulva.  Burning or pain with urination. DIAGNOSIS  Your health care provider will take a medical history and examine the vagina for signs of bacterial vaginosis. A sample of vaginal fluid may be taken. Your health care provider will look at this sample under a microscope to check for bacteria and abnormal cells. A vaginal pH test may also be done.  TREATMENT  Bacterial vaginosis may be treated with antibiotic medicines. These may be given in the form of a pill or a vaginal cream. A second round of antibiotics may be prescribed if the condition comes back after treatment. Because bacterial vaginosis increases your risk for sexually transmitted diseases, getting treated can help reduce your risk for chlamydia, gonorrhea, HIV, and herpes. HOME CARE INSTRUCTIONS   Only take over-the-counter or prescription medicines as directed by your health care provider.  If antibiotic medicine was prescribed, take it as directed. Make sure you finish it even if you start to feel better.  Tell all sexual partners that you have a vaginal infection. They should see their health care provider and be treated if they have problems, such as a mild rash or itching.  During treatment, it is important that you follow these instructions:  Avoid sexual activity or use condoms correctly.  Do not douche.  Avoid alcohol as directed by your health care provider.  Avoid breastfeeding as  directed by your health care provider. SEEK MEDICAL CARE IF:   Your symptoms are not improving after 3 days of treatment.  You have increased discharge or pain.  You have a fever. MAKE SURE YOU:   Understand these instructions.  Will watch your condition.  Will get help right away if you are not doing well or get worse. FOR MORE INFORMATION  Centers for Disease Control and Prevention, Division of STD Prevention: [employer]co.za American Sexual Health Association (ASHA): www.ashastd.org    This information is not intended to replace advice given to you by your health care provider. Make sure you discuss any questions you have with your health care provider.   Document Released: 01/21/2005 Document Revised: 02/11/2014 Document Reviewed: 09/02/2012 Elsevier Interactive Patient Education 2016 Elsevier Inc.    Bacterial Vaginosis Bacterial vaginosis is a vaginal infection that occurs when the normal balance of bacteria in the vagina is disrupted. It results from an overgrowth of certain bacteria. This is the most common vaginal infection in women of childbearing age. Treatment is important to prevent complications, especially in pregnant women, as it can cause a premature delivery. CAUSES  Bacterial vaginosis is caused by an increase in harmful bacteria that are normally present in smaller amounts in the vagina. Several different kinds of bacteria can cause bacterial vaginosis. However, the reason that  the condition develops is not fully understood. RISK FACTORS Certain activities or behaviors can put you at an increased risk of developing bacterial vaginosis, including:  Having a new sex partner or multiple sex partners.  Douching.  Using an intrauterine device (IUD) for contraception. Women do not get bacterial vaginosis from toilet seats, bedding, swimming pools, or contact with objects around them. SIGNS AND SYMPTOMS  Some women with bacterial vaginosis have no signs or  symptoms. Common symptoms include:  Grey vaginal discharge.  A fishlike odor with discharge, especially after sexual intercourse.  Itching or burning of the vagina and vulva.  Burning or pain with urination. DIAGNOSIS  Your health care provider will take a medical history and examine the vagina for signs of bacterial vaginosis. A sample of vaginal fluid may be taken. Your health care provider will look at this sample under a microscope to check for bacteria and abnormal cells. A vaginal pH test may also be done.  TREATMENT  Bacterial vaginosis may be treated with antibiotic medicines. These may be given in the form of a pill or a vaginal cream. A second round of antibiotics may be prescribed if the condition comes back after treatment. Because bacterial vaginosis increases your risk for sexually transmitted diseases, getting treated can help reduce your risk for chlamydia, gonorrhea, HIV, and herpes. HOME CARE INSTRUCTIONS   Only take over-the-counter or prescription medicines as directed by your health care provider.  If antibiotic medicine was prescribed, take it as directed. Make sure you finish it even if you start to feel better.  Tell all sexual partners that you have a vaginal infection. They should see their health care provider and be treated if they have problems, such as a mild rash or itching.  During treatment, it is important that you follow these instructions:  Avoid sexual activity or use condoms correctly.  Do not douche.  Avoid alcohol as directed by your health care provider.  Avoid breastfeeding as directed by your health care provider. SEEK MEDICAL CARE IF:   Your symptoms are not improving after 3 days of treatment.  You have increased discharge or pain.  You have a fever. MAKE SURE YOU:   Understand these instructions.  Will watch your condition.  Will get help right away if you are not doing well or get worse. FOR MORE INFORMATION  Centers for  Disease Control and Prevention, Division of STD Prevention: [employer]co.za American Sexual Health Association (ASHA): www.ashastd.org    This information is not intended to replace advice given to you by your health care provider. Make sure you discuss any questions you have with your health care provider.   Document Released: 01/21/2005 Document Revised: 02/11/2014 Document Reviewed: 09/02/2012 Elsevier Interactive Patient Education [employer]

## 2015-09-08 NOTE — MAU Note (Signed)
Report given to Dr. Omer Jack in department.

## 2015-09-11 LAB — GC/CHLAMYDIA PROBE AMP (~~LOC~~) NOT AT ARMC
Chlamydia: NEGATIVE
NEISSERIA GONORRHEA: NEGATIVE

## 2015-09-13 ENCOUNTER — Telehealth: Payer: Self-pay | Admitting: *Deleted

## 2015-09-13 ENCOUNTER — Other Ambulatory Visit: Payer: Self-pay | Admitting: Certified Nurse Midwife

## 2015-09-13 DIAGNOSIS — B009 Herpesviral infection, unspecified: Secondary | ICD-10-CM

## 2015-09-13 DIAGNOSIS — O98513 Other viral diseases complicating pregnancy, third trimester: Principal | ICD-10-CM

## 2015-09-13 MED ORDER — VALACYCLOVIR HCL 500 MG PO TABS: 500.0000 mg | ORAL_TABLET | Freq: Every day | ORAL | 5 refills | Status: DC

## 2015-09-13 NOTE — Telephone Encounter (Signed)
Patient notified

## 2015-09-13 NOTE — Telephone Encounter (Signed)
-----   Message from Roe Coombsachelle A Denney, CNM sent at 09/13/2015  5:27 PM EDT ----- Contact: [phone] Yes, please let her know that she should have another refill with 5 refills on it.   Thank you.  R.Denney CNM    ----- Message ----- From: Elson AreasJane F Paul, RN Sent: 09/12/2015   7:27 PM To: Roe Coombsachelle A Denney, CNM  Can we Rx for patient ----- Message ----- From: Cherrie DistanceYalonda S Bethea, NT Sent: 09/12/2015  10:24 AM To: Elson AreasJane F Paul, RN  Patient called in requesting a refill on Valtrex

## 2015-09-15 ENCOUNTER — Encounter: Payer: Medicaid Other | Admitting: Certified Nurse Midwife

## 2015-09-19 ENCOUNTER — Ambulatory Visit (INDEPENDENT_AMBULATORY_CARE_PROVIDER_SITE_OTHER): Payer: Medicaid Other | Admitting: Certified Nurse Midwife

## 2015-09-19 ENCOUNTER — Inpatient Hospital Stay (HOSPITAL_COMMUNITY)
Admission: AD | Admit: 2015-09-19 | Discharge: 2015-09-22 | DRG: 775 | Disposition: A | Discharge status: Home / Self Care | Payer: Medicaid Other | Source: Ambulatory Visit | Attending: Family Medicine | Admitting: Family Medicine

## 2015-09-19 ENCOUNTER — Encounter: Payer: Self-pay | Admitting: *Deleted

## 2015-09-19 VITALS — BP 142/94 | HR 77 | Temp 97.8°F | Wt 157.8 lb

## 2015-09-19 DIAGNOSIS — Z833 Family history of diabetes mellitus: Secondary | ICD-10-CM | POA: Diagnosis not present

## 2015-09-19 DIAGNOSIS — O9081 Anemia of the puerperium: Secondary | ICD-10-CM | POA: Diagnosis not present

## 2015-09-19 DIAGNOSIS — Z3403 Encounter for supervision of normal first pregnancy, third trimester: Secondary | ICD-10-CM

## 2015-09-19 DIAGNOSIS — D649 Anemia, unspecified: Secondary | ICD-10-CM | POA: Diagnosis not present

## 2015-09-19 DIAGNOSIS — Z3A38 38 weeks gestation of pregnancy: Secondary | ICD-10-CM

## 2015-09-19 DIAGNOSIS — O133 Gestational [pregnancy-induced] hypertension without significant proteinuria, third trimester: Secondary | ICD-10-CM | POA: Diagnosis present

## 2015-09-19 DIAGNOSIS — O134 Gestational [pregnancy-induced] hypertension without significant proteinuria, complicating childbirth: Principal | ICD-10-CM | POA: Diagnosis present

## 2015-09-19 DIAGNOSIS — Z8249 Family history of ischemic heart disease and other diseases of the circulatory system: Secondary | ICD-10-CM | POA: Diagnosis not present

## 2015-09-19 DIAGNOSIS — O163 Unspecified maternal hypertension, third trimester: Secondary | ICD-10-CM

## 2015-09-19 LAB — URINALYSIS, ROUTINE W REFLEX MICROSCOPIC
Bilirubin Urine: NEGATIVE
Glucose, UA: NEGATIVE mg/dL
Hgb urine dipstick: NEGATIVE
KETONES UR: NEGATIVE mg/dL
NITRITE: NEGATIVE
PH: 6 (ref 5.0–8.0)
PROTEIN: NEGATIVE mg/dL
Specific Gravity, Urine: 1.01 (ref 1.005–1.030)

## 2015-09-19 LAB — POCT URINALYSIS DIPSTICK
BILIRUBIN UA: NEGATIVE
Blood, UA: NEGATIVE
Glucose, UA: NEGATIVE
KETONES UA: NEGATIVE
LEUKOCYTES UA: NEGATIVE
NITRITE UA: NEGATIVE
PH UA: 8
PROTEIN UA: NEGATIVE
Spec Grav, UA: <=1.005
Urobilinogen, UA: 0.2

## 2015-09-19 LAB — CBC
HCT: 31.9 % — ABNORMAL LOW (ref 36.0–46.0)
Hemoglobin: 10.9 g/dL — ABNORMAL LOW (ref 12.0–15.0)
MCH: 30.6 pg (ref 26.0–34.0)
MCHC: 34.2 g/dL (ref 30.0–36.0)
MCV: 89.6 fL (ref 78.0–100.0)
PLATELETS: 132 10*3/uL — AB (ref 150–400)
RBC: 3.56 MIL/uL — ABNORMAL LOW (ref 3.87–5.11)
RDW: 14.6 % (ref 11.5–15.5)
WBC: 9.4 10*3/uL (ref 4.0–10.5)

## 2015-09-19 LAB — COMPREHENSIVE METABOLIC PANEL
ALBUMIN: 3.2 g/dL — AB (ref 3.5–5.0)
ALT: 18 U/L (ref 14–54)
ANION GAP: 7 (ref 5–15)
AST: 22 U/L (ref 15–41)
Alkaline Phosphatase: 105 U/L (ref 38–126)
BUN: 7 mg/dL (ref 6–20)
CO2: 20 mmol/L — AB (ref 22–32)
Calcium: 9 mg/dL (ref 8.9–10.3)
Chloride: 108 mmol/L (ref 101–111)
Creatinine, Ser: 0.53 mg/dL (ref 0.44–1.00)
GFR calc non Af Amer: >60 mL/min (ref >60)
GLUCOSE: 77 mg/dL (ref 65–99)
POTASSIUM: 3.6 mmol/L (ref 3.5–5.1)
SODIUM: 135 mmol/L (ref 135–145)
Total Bilirubin: 0.4 mg/dL (ref 0.3–1.2)
Total Protein: 6.7 g/dL (ref 6.5–8.1)

## 2015-09-19 LAB — PROTEIN / CREATININE RATIO, URINE: Creatinine, Urine: 23 mg/dL

## 2015-09-19 LAB — TYPE AND SCREEN
ABO/RH(D): A POS
ANTIBODY SCREEN: NEGATIVE

## 2015-09-19 LAB — URINE MICROSCOPIC-ADD ON

## 2015-09-19 MED ORDER — SOD CITRATE-CITRIC ACID 500-334 MG/5ML PO SOLN
30.0000 mL | ORAL | Status: DC | PRN
  Administered 2015-09-20 (×2): 30 mL via ORAL
  Filled 2015-09-19 (×2): qty 15

## 2015-09-19 MED ORDER — LIDOCAINE HCL (PF) 1 % IJ SOLN
30.0000 mL | INTRAMUSCULAR | Status: DC | PRN
  Filled 2015-09-19: qty 30

## 2015-09-19 MED ORDER — OXYCODONE-ACETAMINOPHEN 5-325 MG PO TABS: 2.0000 | ORAL_TABLET | ORAL | Status: DC | PRN

## 2015-09-19 MED ORDER — MISOPROSTOL 25 MCG QUARTER TABLET
25.0000 ug | ORAL_TABLET | ORAL | Status: DC | PRN
  Administered 2015-09-19 – 2015-09-20 (×3): 25 ug via VAGINAL
  Filled 2015-09-19: qty 0.25
  Filled 2015-09-19: qty 1
  Filled 2015-09-19 (×2): qty 0.25

## 2015-09-19 MED ORDER — ONDANSETRON HCL 4 MG/2ML IJ SOLN
4.0000 mg | Freq: Four times a day (QID) | INTRAMUSCULAR | Status: DC | PRN
  Administered 2015-09-20: 4 mg via INTRAVENOUS
  Filled 2015-09-19: qty 2

## 2015-09-19 MED ORDER — ACETAMINOPHEN 500 MG PO TABS
1000.0000 mg | ORAL_TABLET | Freq: Once | ORAL | Status: AC
  Administered 2015-09-19: 1000 mg via ORAL
  Filled 2015-09-19: qty 2

## 2015-09-19 MED ORDER — LACTATED RINGERS IV SOLN
500.0000 mL | INTRAVENOUS | Status: DC | PRN
  Administered 2015-09-20: 500 mL via INTRAVENOUS

## 2015-09-19 MED ORDER — OXYTOCIN BOLUS FROM INFUSION
500.0000 mL | Freq: Once | INTRAVENOUS | Status: AC
  Administered 2015-09-20: 500 mL via INTRAVENOUS

## 2015-09-19 MED ORDER — LACTATED RINGERS IV SOLN
INTRAVENOUS | Status: DC
  Administered 2015-09-19 – 2015-09-20 (×2): via INTRAVENOUS

## 2015-09-19 MED ORDER — TERBUTALINE SULFATE 1 MG/ML IJ SOLN
0.2500 mg | Freq: Once | INTRAMUSCULAR | Status: DC | PRN
  Filled 2015-09-19: qty 1

## 2015-09-19 MED ORDER — OXYTOCIN 40 UNITS IN LACTATED RINGERS INFUSION - SIMPLE MED
2.5000 [IU]/h | INTRAVENOUS | Status: DC
  Filled 2015-09-19: qty 1000

## 2015-09-19 MED ORDER — ACETAMINOPHEN 325 MG PO TABS: 650.0000 mg | ORAL_TABLET | ORAL | Status: DC | PRN

## 2015-09-19 MED ORDER — ZOLPIDEM TARTRATE 5 MG PO TABS
5.0000 mg | ORAL_TABLET | Freq: Once | ORAL | Status: AC
  Administered 2015-09-19: 5 mg via ORAL
  Filled 2015-09-19: qty 1

## 2015-09-19 MED ORDER — OXYCODONE-ACETAMINOPHEN 5-325 MG PO TABS: 1.0000 | ORAL_TABLET | ORAL | Status: DC | PRN

## 2015-09-19 NOTE — MAU Note (Signed)
Sent from Dr's office, BP has been creeping up.  Increased swelling in lower ext.  Headache coming on , has been having them off and on.  Is seeing some stars, denies epigastric pain.

## 2015-09-19 NOTE — H&P (Signed)
LABOR AND DELIVERY ADMISSION HISTORY AND PHYSICAL NOTE  Ashley [name] is a 20 y.o. female G1P0 with IUP at [redacted]w[redacted]d by LMP presenting for IOL for gestational hypertension.   She reports positive fetal movement. She denies leakage of fluid or vaginal bleeding.  Prenatal History/Complications:  Past Medical History: Past Medical History:  Diagnosis Date  . Medical history non-contributory     Past Surgical History: Past Surgical History:  Procedure Laterality Date  . NO PAST SURGERIES      Obstetrical History: OB History    Gravida Para Term Preterm AB Living   1             SAB TAB Ectopic Multiple Live Births                  Social History: Social History   Social History  . Marital status: Single    Spouse name: N/A  . Number of children: N/A  . Years of education: N/A   Social History Main Topics  . Smoking status: Never Smoker  . Smokeless tobacco: Never Used  . Alcohol use No  . Drug use: No  . Sexual activity: Yes    Birth control/ protection: None   Other Topics Concern  . Not on file   Social History Narrative  . No narrative on file    Family History: Family History  Problem Relation Age of Onset  . Diabetes Mother   . Hypertension Mother     Allergies: Allergies  Allergen Reactions  . Penicillins Hives and Rash    Foamed from mouth Has patient had a PCN reaction causing immediate rash, facial/tongue/throat swelling, SOB or lightheadedness with hypotension: Yes Has patient had a PCN reaction causing severe rash involving mucus membranes or skin necrosis: No Has patient had a PCN reaction that required hospitalization No Has patient had a PCN reaction occurring within the last 10 years: No If all of the above answers are "NO", then may proceed with Cephalosporin use.     Prescriptions Prior to Admission  Medication Sig Dispense Refill Last Dose  . diphenhydrAMINE (BENADRYL) 25 MG tablet Take 25 mg by mouth at bedtime as needed for  allergies.    Past Week at Unknown time  . doxylamine, Sleep, (UNISOM) 25 MG tablet Take 25 mg by mouth at bedtime as needed for sleep.    Past Week at Unknown time  . Prenatal Vit-Fe Fumarate-FA (PRENATAL MULTIVITAMIN) TABS tablet Take 1 tablet by mouth daily.   09/19/2015 at Unknown time  . valACYclovir (VALTREX) 500 MG tablet Take 1 tablet (500 mg total) by mouth daily. 30 tablet 5 09/19/2015 at Unknown time  . metroNIDAZOLE (METROGEL VAGINAL) 0.75 % vaginal gel Place 1 Applicatorful vaginally 2 (two) times daily. (Patient not taking: Reported on 09/19/2015) 70 g 0 Not Taking at Unknown time  . ranitidine (ZANTAC) 150 MG tablet Take 1 tablet (150 mg total) by mouth 2 (two) times daily. (Patient not taking: Reported on 09/19/2015) 60 tablet 0 Not Taking at Unknown time     Review of Systems   All systems reviewed and negative except as stated in HPI  Blood pressure 132/74, pulse 70, temperature 98.4 F (36.9 C), temperature source Oral, resp. rate 16, last menstrual period 12/27/2014. General appearance: alert, cooperative and no distress Lungs: clear to auscultation bilaterally Heart: regular rate and rhythm Abdomen: soft, non-tender; bowel sounds normal Extremities: No calf swelling or tenderness Presentation: cephalic Fetal monitoring:cat 1 Uterine activity: no appreciable UCs  Prenatal labs: ABO, Rh: A/POS/-- (02/14 1059) Antibody: NEG (02/14 1059) Rubella: !Error! RPR: Non Reactive (06/07 1049)  HBsAg: NEGATIVE (02/14 1059)  HIV: Non Reactive (06/07 1049)  GBS: Negative (07/25 1437)  1 hr Glucola: 2 hr normal Genetic screening:  guad neg Anatomy US: normal female  Prenatal Transfer Tool  Maternal Diabetes: No Genetic Screening: Normal Maternal Ultrasounds/Referrals: Normal Fetal Ultrasounds or other Referrals:  None Maternal Substance Abuse:  none Significant Maternal Medications:  none Significant Maternal Lab Results: +HSV, on valtrex  Results for orders placed or  performed during the hospital encounter of 09/19/15 (from the past 24 hour(s))  Urinalysis, Routine w reflex microscopic ([facility])   Collection Time: 09/19/15  3:42 PM  Result Value Ref Range   Color, Urine YELLOW YELLOW   APPearance CLEAR CLEAR   Specific Gravity, Urine 1.010 1.005 - 1.030   pH 6.0 5.0 - 8.0   Glucose, UA NEGATIVE NEGATIVE mg/dL   Hgb urine dipstick NEGATIVE NEGATIVE   Bilirubin Urine NEGATIVE NEGATIVE   Ketones, ur NEGATIVE NEGATIVE mg/dL   Protein, ur NEGATIVE NEGATIVE mg/dL   Nitrite NEGATIVE NEGATIVE   Leukocytes, UA SMALL (A) NEGATIVE  Protein / creatinine ratio, urine   Collection Time: 09/19/15  3:42 PM  Result Value Ref Range   Creatinine, Urine 23.00 mg/dL   Total Protein, Urine <6 mg/dL   Protein Creatinine Ratio        0.00 - 0.15 mg/mg[Cre]  Urine microscopic-add on   Collection Time: 09/19/15  3:42 PM  Result Value Ref Range   Squamous Epithelial / LPF 6-30 (A) NONE SEEN   WBC, UA 6-30 0 - 5 WBC/hpf   RBC / HPF 0-5 0 - 5 RBC/hpf   Bacteria, UA MANY (A) NONE SEEN  CBC   Collection Time: 09/19/15  4:25 PM  Result Value Ref Range   WBC 9.4 4.0 - 10.5 K/uL   RBC 3.56 (L) 3.87 - 5.11 MIL/uL   Hemoglobin 10.9 (L) 12.0 - 15.0 g/dL   HCT 54.031.9 (L) 98.136.0 - 19.146.0 %   MCV 89.6 78.0 - 100.0 fL   MCH 30.6 26.0 - 34.0 pg   MCHC 34.2 30.0 - 36.0 g/dL   RDW 47.814.6 29.511.5 - 62.115.5 %   Platelets 132 (L) 150 - 400 K/uL  Comprehensive metabolic panel   Collection Time: 09/19/15  4:25 PM  Result Value Ref Range   Sodium 135 135 - 145 mmol/L   Potassium 3.6 3.5 - 5.1 mmol/L   Chloride 108 101 - 111 mmol/L   CO2 20 (L) 22 - 32 mmol/L   Glucose, Bld 77 65 - 99 mg/dL   BUN 7 6 - 20 mg/dL   Creatinine, Ser 3.080.53 0.44 - 1.00 mg/dL   Calcium 9.0 8.9 - 65.710.3 mg/dL   Total Protein 6.7 6.5 - 8.1 g/dL   Albumin 3.2 (L) 3.5 - 5.0 g/dL   AST 22 15 - 41 U/L   ALT 18 14 - 54 U/L   Alkaline Phosphatase 105 38 - 126 U/L   Total Bilirubin 0.4 0.3 - 1.2 mg/dL   GFR calc non  Af Amer >60 >60 mL/min   GFR calc Af Amer >60 >60 mL/min   Anion gap 7 5 - 15  Results for orders placed or performed in visit on 09/19/15 (from the past 24 hour(s))  POCT Urinalysis Dipstick   Collection Time: 09/19/15  2:56 PM  Result Value Ref Range   Color, UA Yellow  Clarity, UA Clear    Glucose, UA Neg    Bilirubin, UA Neg    Ketones, UA Neg    Spec Grav, UA <=1.005    Blood, UA Neg    pH, UA 8.0    Protein, UA Neg    Urobilinogen, UA 0.2    Nitrite, UA Neg    Leukocytes, UA Negative Negative    Patient Active Problem List   Diagnosis Date Noted  . Supervision of normal first pregnancy 03/21/2015    Assessment: Ashley [name] is a 20 y.o. G1P0 at 7122w0d here for IOL for gestational hypertension.  #gHTN: monitor pressures, collect PreE labs. Pt denies HA or epigastric pain. #Labor:cytotec #Pain: No pain control desired at this time #FWB: Cat 1 #ID:  GBS neg #MOF: breast #MOC: undecided #[facility]: yes, at [facility]  Kate Timberlake 09/19/2015, 6:35 PM   Midwife attestation: I have seen and examined this patient; I agree with above documentation in the resident's note.   Ashley [name] is a 20 y.o. G1P0 here for IOL for Gestational Hypertension  PE: BP 110/77 (BP Location: Right Arm)   Pulse 85   Temp 98.5 F (36.9 C) (Oral)   Resp 18   Ht 5\' 8"  (1.727 m)   Wt 71.2 kg (157 lb)   LMP 12/27/2014   SpO2 100%   BMI 23.87 kg/m  Gen: calm comfortable, NAD Resp: normal effort, no distress Abd: gravid  ROS, labs, PMH reviewed  Plan: Admit to LD Gestational HTN Labor: IOL with Cytotec FWB: Cat I ID: GBS neg  Ashley [name], CNM  09/21/2015, 9:46 AM

## 2015-09-19 NOTE — Progress Notes (Signed)
Pt up to BR

## 2015-09-19 NOTE — Anesthesia Pain Management Evaluation Note (Signed)
  [facility] Pain Management Visit Note  Patient: Ashley [name], 20 y.o., female  "Hello I am a [facility] of the anesthesia team at [facility]. We have an anesthesia team available at all times to provide care throughout the hospital, including epidural management and anesthesia for C-section. I don't know your plan for the delivery whether it a natural birth, water birth, IV sedation, nitrous supplementation, doula or epidural, but we want to meet your pain goals."   1.Was your pain managed to your expectations on prior hospitalizations?   No prior hospitalizations  2.What is your expectation for pain management during this hospitalization?     Labor support without medications, Epidural, IV pain meds, Nitrous Oxide and patient will determine plan as labor progresses  3.How can we help you reach that goal? Patient would like to deliver naturally.  Options of N2O and IV pain medications discussed by [name]&D RN with patient.  Option of epidural discussed with patient.  All questions answered and patient told that this is her birth plan and her decision.  Record the patient's initial score and the patient's pain goal.   Pain: 1  Pain Goal: Patient was not able to verbalize a pain goal number.  [facility] wants you to be able to say your pain was always managed very well.  Ashley [name] 09/19/2015

## 2015-09-19 NOTE — MAU Provider Note (Signed)
History     CSN: [id number]  Arrival date and time: 09/19/15 1532   None     Chief Complaint  Patient presents with  . Headache  . Hypertension   HPI   [name] is a 20 y.o. female G1P0 @ [redacted]w[redacted]d here with elevated BP. No history of high BP. [facility] She was sent here because her BP was elevated in the office.  She has a HA that she has not treated at home with tylenol. The HA is dull in nature. She has had HA's off and on through out the pregnancy which typically resolves with tylenol.   Patient reports that she had an elevated BP over the weekend 160/106; she laid down and it went down.   Denies vaginal bleeding + fetal movement   OB History    Gravida Para Term Preterm AB Living   1             SAB TAB Ectopic Multiple Live Births                  Past Medical History:  Diagnosis Date  . Medical history non-contributory     Past Surgical History:  Procedure Laterality Date  . NO PAST SURGERIES      Family History  Problem Relation Age of Onset  . Diabetes Mother   . Hypertension Mother     Social History  Substance Use Topics  . Smoking status: Never Smoker  . Smokeless tobacco: Never Used  . Alcohol use No    Allergies:  Allergies  Allergen Reactions  . Penicillins Hives and Rash    Foamed from mouth Has patient had a PCN reaction causing immediate rash, facial/tongue/throat swelling, SOB or lightheadedness with hypotension: Yes Has patient had a PCN reaction causing severe rash involving mucus membranes or skin necrosis: No Has patient had a PCN reaction that required hospitalization No Has patient had a PCN reaction occurring within the last 10 years: No If all of the above answers are "NO", then may proceed with Cephalosporin use.     Prescriptions Prior to Admission  Medication Sig Dispense Refill Last Dose  . diphenhydrAMINE (BENADRYL) 25 MG tablet Take 25 mg by mouth at bedtime  as needed for allergies.    Past Week at Unknown time  . doxylamine, Sleep, (UNISOM) 25 MG tablet Take 25 mg by mouth at bedtime as needed for sleep.    Past Week at Unknown time  . Prenatal Vit-Fe Fumarate-FA (PRENATAL MULTIVITAMIN) TABS tablet Take 1 tablet by mouth daily.   09/19/2015 at Unknown time  . valACYclovir (VALTREX) 500 MG tablet Take 1 tablet (500 mg total) by mouth daily. 30 tablet 5 09/19/2015 at Unknown time  . metroNIDAZOLE (METROGEL VAGINAL) 0.75 % vaginal gel Place 1 Applicatorful vaginally 2 (two) times daily. (Patient not taking: Reported on 09/19/2015) 70 g 0 Not Taking at Unknown time  . ranitidine (ZANTAC) 150 MG tablet Take 1 tablet (150 mg total) by mouth 2 (two) times daily. (Patient not taking: Reported on 09/19/2015) 60 tablet 0 Not Taking at Unknown time   Results for orders placed or performed during the hospital encounter of 09/19/15 (from the past 48 hour(s))  [facility], [facility]     Status: Abnormal   Collection Time: 09/19/15  3:42 PM  Result Value Ref Range   Color, Urine YELLOW YELLOW   APPearance CLEAR CLEAR  Specific Gravity, Urine 1.010 1.005 - 1.030   pH 6.0 5.0 - 8.0   Glucose, UA NEGATIVE NEGATIVE mg/dL   Hgb urine dipstick NEGATIVE NEGATIVE   Bilirubin Urine NEGATIVE NEGATIVE   Ketones, ur NEGATIVE NEGATIVE mg/dL   Protein, ur NEGATIVE NEGATIVE mg/dL   Nitrite NEGATIVE NEGATIVE   Leukocytes, UA SMALL (A) NEGATIVE  Protein / creatinine ratio, urine     Status: None   Collection Time: 09/19/15  3:42 PM  Result Value Ref Range   Creatinine, Urine 23.00 mg/dL   Total Protein, Urine <6 mg/dL    Comment: REPEATED TO VERIFY NO NORMAL RANGE ESTABLISHED FOR THIS TEST    Protein Creatinine Ratio        0.00 - 0.15 mg/mg[Cre]    Comment: RESULT BELOW REPORTABLE RANGE, UNABLE TO CALCULATE.   Urine microscopic-add on     Status: Abnormal   Collection Time: 09/19/15  3:42 PM  Result Value Ref Range   Squamous  Epithelial / LPF 6-30 (A) NONE SEEN   WBC, UA 6-30 0 - 5 WBC/hpf   RBC / HPF 0-5 0 - 5 RBC/hpf   Bacteria, UA MANY (A) NONE SEEN  CBC     Status: Abnormal   Collection Time: 09/19/15  4:25 PM  Result Value Ref Range   WBC 9.4 4.0 - 10.5 K/uL   RBC 3.56 (L) 3.87 - 5.11 MIL/uL   Hemoglobin 10.9 (L) 12.0 - 15.0 g/dL   HCT 31.9 (L) 36.0 - 46.0 %   MCV 89.6 78.0 - 100.0 fL   MCH 30.6 26.0 - 34.0 pg   MCHC 34.2 30.0 - 36.0 g/dL   RDW 14.6 11.5 - 15.5 %   Platelets 132 (L) 150 - 400 K/uL  Comprehensive metabolic panel     Status: Abnormal   Collection Time: 09/19/15  4:25 PM  Result Value Ref Range   Sodium 135 135 - 145 mmol/L   Potassium 3.6 3.5 - 5.1 mmol/L   Chloride 108 101 - 111 mmol/L   CO2 20 (L) 22 - 32 mmol/L   Glucose, Bld 77 65 - 99 mg/dL   BUN 7 6 - 20 mg/dL   Creatinine, Ser 0.53 0.44 - 1.00 mg/dL   Calcium 9.0 8.9 - 10.3 mg/dL   Total Protein 6.7 6.5 - 8.1 g/dL   Albumin 3.2 (L) 3.5 - 5.0 g/dL   AST 22 15 - 41 U/L   ALT 18 14 - 54 U/L   Alkaline Phosphatase 105 38 - 126 U/L   Total Bilirubin 0.4 0.3 - 1.2 mg/dL   GFR calc non Af Amer >60 >60 mL/min   GFR calc Af Amer >60 >60 mL/min    Comment: (NOTE) The eGFR has been calculated using the CKD EPI equation. This calculation has not been validated in all clinical situations. eGFR's persistently <60 mL/min signify possible Chronic Kidney Disease.    Anion gap 7 5 - 15    Review of Systems  Eyes: Negative for blurred vision.  Cardiovascular: Positive for leg swelling.  Gastrointestinal: Negative for abdominal pain, nausea and vomiting.  Genitourinary: Negative for dysuria.  Neurological: Positive for headaches (Not treated at home. ).   Physical Exam   Blood pressure 145/93, pulse 70, temperature 98.4 F (36.9 C), temperature source Oral, resp. rate 16, last menstrual period 12/27/2014. Patient Vitals for the past 24 hrs:  BP Temp Temp src Pulse Resp  09/19/15 1831 132/74 - - 70 -  09/19/15 1816 130/76 -  -  70 -  09/19/15 1801 133/88 - - 65 -  09/19/15 1746 (!) 156/101 - - 63 -  09/19/15 1732 154/92 - - 66 -  09/19/15 1716 149/97 - - 66 -  09/19/15 1701 (!) 149/101 - - 65 -  09/19/15 1648 (!) 144/101 - - 67 -  09/19/15 1631 145/93 - - 70 -  09/19/15 1616 140/96 - - 74 -  09/19/15 1601 140/95 - - 75 -  09/19/15 1558 141/84 - - 68 -  09/19/15 1541 143/94 98.4 F (36.9 C) Oral 66 16    Physical Exam  Constitutional: She is oriented to person, place, and time. She appears well-developed and well-nourished. No distress.  HENT:  Head: Normocephalic.  Eyes: Pupils are equal, round, and reactive to light.  GI: Soft. She exhibits no distension. There is no tenderness. There is no rebound.  Genitourinary:  Genitourinary Comments: Cervix: Closed, thick, posterior   Musculoskeletal: Normal range of motion. She exhibits no edema.  Neurological: She is alert and oriented to person, place, and time. She displays normal reflexes.  Negative clonus   Skin: Skin is warm. She is not diaphoretic.  Psychiatric: Her behavior is normal.   Fetal Tracing: Baseline: 130 bpm  Variability: Moderate  Accelerations: 15x15 Decelerations: None  Toco: Irregular pattern   MAU Course  Procedures  None  MDM  PIH labs NST Tylenol 1 gram HA down from a 6/10 to a 2/10; still there, dull in nature.   Platelets down from 28 week office visit and down from her last visit in the MAU 1 week ago.   Assessment and Plan   A:  1. Gestational hypertension w/o significant proteinuria in 3rd trimester     P:  Admit to Labor and Delivery  Cytotec  GBS negative  Category 1 tracing   Lezlie Lye, NP 09/19/2015 7:08 PM

## 2015-09-19 NOTE — Progress Notes (Signed)
Subjective:    Kimetha Harland DingwallSettle is a 20 y.o. female being seen today for her obstetrical visit. She is at 11057w0d gestation. Patient reports backache, headache, no bleeding, no contractions, no cramping, no leaking and elevated blood pressure over the weekend: 160/106, layed down and blood pressure came down.  states has had lower leg edema since this weekend.  Is currently working. . Fetal movement: normal.  Problem List Items Addressed This Visit      Other   Supervision of normal first pregnancy - Primary   Relevant Orders   POCT Urinalysis Dipstick (Completed)    Other Visit Diagnoses   None.    Patient Active Problem List   Diagnosis Date Noted  . Supervision of normal first pregnancy 03/21/2015    Objective:    BP (!) 142/94   Pulse 77   Temp 97.8 F (36.6 C)   Wt 157 lb 12.8 oz (71.6 kg)   LMP 12/27/2014   BMI 23.99 kg/m  FHT: 140 BPM  Uterine Size: 34 cm and size less than dates  Presentations: cephalic  Pelvic Exam: deferred     Assessment:    Pregnancy @ 3257w0d weeks    Elevated blood pressure pregnancy  bilateral lower leg edema   HA in pregnancy  Upper gastric pain pregnancy   Plan:    To MAU for evaluation of elevated blood pressures F/U US @ MFM ordered Plans for delivery: Vaginal anticipated; labs reviewed; problem list updated Counseling: Consent signed. Infant feeding: plans to breastfeed. Cigarette smoking: never smoked. L&D discussion: symptoms of labor, discussed when to call, discussed what number to call, anesthetic/analgesic options reviewed and delivering clinician:  plans no preference. Postpartum supports and preparation: circumcision discussed and contraception plans discussed.  Follow up in 1 Week if not delivered.

## 2015-09-19 NOTE — MAU Provider Note (Signed)
US done to verify presentation.  Fetus had recently been breech.  Longitudinal lie Vertex presentation Fetal spine to maternal right  Ashley [name], CNM

## 2015-09-19 NOTE — Progress Notes (Signed)
Assumed care from Anselmo RodV. Mensah, RN

## 2015-09-19 NOTE — Progress Notes (Signed)
Pt c/o burning sensation under rib cage, swelling of left foot and leg. She states her BP has been elevated since this past Friday night. She c/o increasing headaches, denies vision changes and dizziness.

## 2015-09-20 ENCOUNTER — Inpatient Hospital Stay (HOSPITAL_COMMUNITY): Payer: Medicaid Other | Admitting: Anesthesiology

## 2015-09-20 ENCOUNTER — Encounter (HOSPITAL_COMMUNITY): Payer: Self-pay

## 2015-09-20 DIAGNOSIS — O134 Gestational [pregnancy-induced] hypertension without significant proteinuria, complicating childbirth: Secondary | ICD-10-CM

## 2015-09-20 DIAGNOSIS — O9081 Anemia of the puerperium: Secondary | ICD-10-CM

## 2015-09-20 DIAGNOSIS — Z3A38 38 weeks gestation of pregnancy: Secondary | ICD-10-CM

## 2015-09-20 LAB — CULTURE, OB URINE: Culture: NO GROWTH

## 2015-09-20 LAB — ABO/RH: ABO/RH(D): A POS

## 2015-09-20 LAB — RPR: RPR: NONREACTIVE

## 2015-09-20 MED ORDER — FENTANYL CITRATE (PF) 100 MCG/2ML IJ SOLN
INTRAMUSCULAR | Status: AC
  Administered 2015-09-20: 100 ug via INTRAVENOUS
  Filled 2015-09-20: qty 2

## 2015-09-20 MED ORDER — ACETAMINOPHEN 325 MG PO TABS
650.0000 mg | ORAL_TABLET | ORAL | Status: DC | PRN
  Administered 2015-09-21 – 2015-09-22 (×3): 650 mg via ORAL
  Filled 2015-09-20 (×3): qty 2

## 2015-09-20 MED ORDER — ONDANSETRON HCL 4 MG PO TABS: 4.0000 mg | ORAL_TABLET | ORAL | Status: DC | PRN

## 2015-09-20 MED ORDER — PHENYLEPHRINE 40 MCG/ML (10ML) SYRINGE FOR IV PUSH (FOR BLOOD PRESSURE SUPPORT)
80.0000 ug | PREFILLED_SYRINGE | INTRAVENOUS | Status: DC | PRN
Start: 2015-09-20 — End: 2015-09-20
  Filled 2015-09-20: qty 5

## 2015-09-20 MED ORDER — CALCIUM CARBONATE ANTACID 500 MG PO CHEW
200.0000 mg | CHEWABLE_TABLET | ORAL | Status: DC | PRN
  Administered 2015-09-20: 200 mg via ORAL
  Filled 2015-09-20: qty 1

## 2015-09-20 MED ORDER — EPHEDRINE 5 MG/ML INJ
10.0000 mg | INTRAVENOUS | Status: DC | PRN
  Filled 2015-09-20: qty 4

## 2015-09-20 MED ORDER — LACTATED RINGERS IV SOLN
INTRAVENOUS | Status: DC
  Administered 2015-09-20: 11:00:00 via INTRAUTERINE

## 2015-09-20 MED ORDER — SENNOSIDES-DOCUSATE SODIUM 8.6-50 MG PO TABS
2.0000 | ORAL_TABLET | ORAL | Status: DC
  Administered 2015-09-21: 2 via ORAL
  Filled 2015-09-20 (×2): qty 2

## 2015-09-20 MED ORDER — FENTANYL 2.5 MCG/ML BUPIVACAINE 1/10 % EPIDURAL INFUSION (WH - ANES)
14.0000 mL/h | INTRAMUSCULAR | Status: DC | PRN
  Administered 2015-09-20: 14 mL/h via EPIDURAL

## 2015-09-20 MED ORDER — ONDANSETRON HCL 4 MG/2ML IJ SOLN: 4.0000 mg | INTRAMUSCULAR | Status: DC | PRN

## 2015-09-20 MED ORDER — DIPHENHYDRAMINE HCL 50 MG/ML IJ SOLN: 12.5000 mg | INTRAMUSCULAR | Status: DC | PRN

## 2015-09-20 MED ORDER — PHENYLEPHRINE 40 MCG/ML (10ML) SYRINGE FOR IV PUSH (FOR BLOOD PRESSURE SUPPORT)
80.0000 ug | PREFILLED_SYRINGE | INTRAVENOUS | Status: DC | PRN
  Filled 2015-09-20: qty 5

## 2015-09-20 MED ORDER — DIBUCAINE 1 % RE OINT: 1.0000 "application " | TOPICAL_OINTMENT | RECTAL | Status: DC | PRN

## 2015-09-20 MED ORDER — SIMETHICONE 80 MG PO CHEW: 80.0000 mg | CHEWABLE_TABLET | ORAL | Status: DC | PRN

## 2015-09-20 MED ORDER — LACTATED RINGERS IV SOLN: 500.0000 mL | Freq: Once | INTRAVENOUS | Status: DC

## 2015-09-20 MED ORDER — DIPHENHYDRAMINE HCL 25 MG PO CAPS: 25.0000 mg | ORAL_CAPSULE | Freq: Four times a day (QID) | ORAL | Status: DC | PRN

## 2015-09-20 MED ORDER — FENTANYL CITRATE (PF) 100 MCG/2ML IJ SOLN
100.0000 ug | INTRAMUSCULAR | Status: DC | PRN
  Administered 2015-09-20 (×3): 100 ug via INTRAVENOUS
  Filled 2015-09-20 (×2): qty 2

## 2015-09-20 MED ORDER — WITCH HAZEL-GLYCERIN EX PADS: 1.0000 "application " | MEDICATED_PAD | CUTANEOUS | Status: DC | PRN

## 2015-09-20 MED ORDER — ZOLPIDEM TARTRATE 5 MG PO TABS: 5.0000 mg | ORAL_TABLET | Freq: Every evening | ORAL | Status: DC | PRN

## 2015-09-20 MED ORDER — PRENATAL MULTIVITAMIN CH
1.0000 | ORAL_TABLET | Freq: Every day | ORAL | Status: DC
  Administered 2015-09-21 – 2015-09-22 (×2): 1 via ORAL
  Filled 2015-09-20 (×2): qty 1

## 2015-09-20 MED ORDER — LIDOCAINE HCL (PF) 1 % IJ SOLN
INTRAMUSCULAR | Status: DC | PRN
  Administered 2015-09-20: 7 mL
  Administered 2015-09-20: 5 mL

## 2015-09-20 MED ORDER — FENTANYL 2.5 MCG/ML BUPIVACAINE 1/10 % EPIDURAL INFUSION (WH - ANES)
INTRAMUSCULAR | Status: AC
  Filled 2015-09-20: qty 125

## 2015-09-20 MED ORDER — TETANUS-DIPHTH-ACELL PERTUSSIS 5-2.5-18.5 LF-MCG/0.5 IM SUSP: 0.5000 mL | Freq: Once | INTRAMUSCULAR | Status: DC

## 2015-09-20 MED ORDER — COCONUT OIL OIL: 1.0000 "application " | TOPICAL_OIL | Status: DC | PRN

## 2015-09-20 MED ORDER — PHENYLEPHRINE 40 MCG/ML (10ML) SYRINGE FOR IV PUSH (FOR BLOOD PRESSURE SUPPORT)
PREFILLED_SYRINGE | INTRAVENOUS | Status: DC
Start: 2015-09-20 — End: 2015-09-20
  Filled 2015-09-20: qty 20

## 2015-09-20 MED ORDER — IBUPROFEN 600 MG PO TABS
600.0000 mg | ORAL_TABLET | Freq: Four times a day (QID) | ORAL | Status: DC
  Administered 2015-09-20 – 2015-09-22 (×8): 600 mg via ORAL
  Filled 2015-09-20 (×8): qty 1

## 2015-09-20 MED ORDER — BENZOCAINE-MENTHOL 20-0.5 % EX AERO
1.0000 "application " | INHALATION_SPRAY | CUTANEOUS | Status: DC | PRN
  Administered 2015-09-22: 1 via TOPICAL
  Filled 2015-09-20: qty 56

## 2015-09-20 NOTE — Progress Notes (Signed)
Patient ID: Ashley [name], female   DOB: 01-11-1996, 20 y.o.   MRN: [id number] Vitals:   09/20/15 1131 09/20/15 1150 09/20/15 1159 09/20/15 1201  BP: 134/90  (!) 138/92 (!) 138/92  Pulse: (!) 122  78 78  Resp:  18    Temp:  97.7 F (36.5 C)    TempSrc:  Axillary    SpO2:      Weight:      Height:       FHR decels improved Some decels small variables, some early  UCs every 2-344min, per  [facility].   MVUs about 200-240/5010min  Dilation: 7 Effacement (%): 90 Cervical Position: Posterior Station: -1 Presentation: Vertex Exam by:: Dr. Genevie AnnSchenk  Labor progressing well

## 2015-09-20 NOTE — Progress Notes (Signed)
Ashley [name] is a 20 y.o. G1P0 at 5264w1d by LMP admitted for induction of labor due to Hypertension.  Subjective: Called to see pt d/t variable decels, patient uncomfortable epidural not working well yet.  Objective: BP (!) 156/92   Pulse 73   Temp 97.8 F (36.6 C) (Axillary)   Resp 18   Ht 5\' 8"  (1.727 m)   Wt 71.2 kg (157 lb)   LMP 12/27/2014   SpO2 98%   BMI 23.87 kg/m  No intake/output data recorded. No intake/output data recorded.  FHT:  FHR: 130 bpm, variability: moderate,  accelerations:  Abscent,  decelerations:  Present variable UC:   regular, every 2-3 minutes SVE:   Dilation: 4.5 Effacement (%): 90 Station: -2 Exam by:: M.Merrill, RN  Labs: Lab Results  Component Value Date   WBC 9.4 09/19/2015   HGB 10.9 (L) 09/19/2015   HCT 31.9 (L) 09/19/2015   MCV 89.6 09/19/2015   PLT 132 (L) 09/19/2015    Assessment / Plan: Induction of labor due to gestational hypertension,  progressing well on pitocin  Labor: Progressing normally Fetal Wellbeing:  Category II Pain Control:  Epidural I/D:  n/a Anticipated MOD:  NSVD  FHR somewhat improved with positional changes. Dr Genevie AnnSchenk in room. If decels become deeper plan for AROM and amnioinfusion.  Ashley [name] 09/20/2015, 10:47 AM

## 2015-09-20 NOTE — Progress Notes (Signed)
Patient ID: Ashley [name], female   DOB: 07/13/1995, 20 y.o.   MRN: [id number] Ashley [name] is a 20 y.o. G1P0 at 9427w1d.  Subjective: Increased cramping. Requesting IV pain meds.   Objective: BP (!) 146/72   Pulse 63   Temp 98 F (36.7 C) (Oral)   Resp 18   Ht 5\' 8"  (1.727 m)   Wt 157 lb (71.2 kg)   LMP 12/27/2014   BMI 23.87 kg/m    FHT:  FHR: 140 bpm, variability: mod,  accelerations:  15x15,  decelerations:  none UC:   Q 2-5 minutes, mild-mod Dilation: 1.5 Effacement (%): 60, 70 Cervical Position: Posterior Station: -1, -2 Presentation: Vertex Exam by:: Rogelio SeenMcCall, RN   Labs: Results for orders placed or performed during the hospital encounter of 09/19/15 (from the past 24 hour(s))  Urinalysis, Routine w reflex microscopic (not at [facility])     Status: Abnormal   Collection Time: 09/19/15  3:42 PM  Result Value Ref Range   Color, Urine YELLOW YELLOW   APPearance CLEAR CLEAR   Specific Gravity, Urine 1.010 1.005 - 1.030   pH 6.0 5.0 - 8.0   Glucose, UA NEGATIVE NEGATIVE mg/dL   Hgb urine dipstick NEGATIVE NEGATIVE   Bilirubin Urine NEGATIVE NEGATIVE   Ketones, ur NEGATIVE NEGATIVE mg/dL   Protein, ur NEGATIVE NEGATIVE mg/dL   Nitrite NEGATIVE NEGATIVE   Leukocytes, UA SMALL (A) NEGATIVE  Protein / creatinine ratio, urine     Status: None   Collection Time: 09/19/15  3:42 PM  Result Value Ref Range   Creatinine, Urine 23.00 mg/dL   Total Protein, Urine <6 mg/dL   Protein Creatinine Ratio        0.00 - 0.15 mg/mg[Cre]  Urine microscopic-add on     Status: Abnormal   Collection Time: 09/19/15  3:42 PM  Result Value Ref Range   Squamous Epithelial / LPF 6-30 (A) NONE SEEN   WBC, UA 6-30 0 - 5 WBC/hpf   RBC / HPF 0-5 0 - 5 RBC/hpf   Bacteria, UA MANY (A) NONE SEEN  CBC     Status: Abnormal   Collection Time: 09/19/15  4:25 PM  Result Value Ref Range   WBC 9.4 4.0 - 10.5 K/uL   RBC 3.56 (L) 3.87 - 5.11 MIL/uL   Hemoglobin 10.9 (L) 12.0 - 15.0 g/dL   HCT 40.931.9 (L) 81.136.0  - 46.0 %   MCV 89.6 78.0 - 100.0 fL   MCH 30.6 26.0 - 34.0 pg   MCHC 34.2 30.0 - 36.0 g/dL   RDW 91.414.6 78.211.5 - 95.615.5 %   Platelets 132 (L) 150 - 400 K/uL  Comprehensive metabolic panel     Status: Abnormal   Collection Time: 09/19/15  4:25 PM  Result Value Ref Range   Sodium 135 135 - 145 mmol/L   Potassium 3.6 3.5 - 5.1 mmol/L   Chloride 108 101 - 111 mmol/L   CO2 20 (L) 22 - 32 mmol/L   Glucose, Bld 77 65 - 99 mg/dL   BUN 7 6 - 20 mg/dL   Creatinine, Ser 2.130.53 0.44 - 1.00 mg/dL   Calcium 9.0 8.9 - 08.610.3 mg/dL   Total Protein 6.7 6.5 - 8.1 g/dL   Albumin 3.2 (L) 3.5 - 5.0 g/dL   AST 22 15 - 41 U/L   ALT 18 14 - 54 U/L   Alkaline Phosphatase 105 38 - 126 U/L   Total Bilirubin 0.4 0.3 - 1.2 mg/dL   GFR calc non  Af Amer >60 >60 mL/min   GFR calc Af Amer >60 >60 mL/min   Anion gap 7 5 - 15  [facility]     Status: None   Collection Time: 09/19/15  7:15 PM  Result Value Ref Range   ABO/RH(D) A POS    Antibody Screen NEG    Sample Expiration 09/22/2015     Assessment / Plan: 5432w1d week IUP Labor: Early/IOL Fetal Wellbeing:  Category I Pain Control:  Fentanyl Anticipated MOD:  SVD  Dorathy KinsmanVirginia Fortunato Nordin, CNM 09/20/2015 3:31 AM

## 2015-09-20 NOTE — Anesthesia Preprocedure Evaluation (Signed)
Anesthesia Evaluation  Patient identified by MRN, date of birth, ID band Patient awake    Reviewed: Allergy & Precautions, NPO status , Patient's Chart, lab work & pertinent test results  History of Anesthesia Complications Negative for: history of anesthetic complications  Airway Mallampati: II  TM Distance: >3 FB Neck ROM: Full    Dental  (+) Dental Advisory Given, Teeth Intact   Pulmonary neg pulmonary ROS,    Pulmonary exam normal breath sounds clear to auscultation       Cardiovascular negative cardio ROS   Rhythm:Regular Rate:Normal     Neuro/Psych negative neurological ROS     GI/Hepatic negative GI ROS, Neg liver ROS, neg GERD  ,  Endo/Other  negative endocrine ROS  Renal/GU negative Renal ROS     Musculoskeletal   Abdominal (+) - obese,   Peds  Hematology negative hematology ROS (+)   Anesthesia Other Findings Hgb 10.9 Platelets 132  Reproductive/Obstetrics (+) Pregnancy                             Anesthesia Physical Anesthesia Plan  ASA: II  Anesthesia Plan: Epidural   Post-op Pain Management:    Induction:   Airway Management Planned: Natural Airway  Additional Equipment:   Intra-op Plan:   Post-operative Plan:   Informed Consent: I have reviewed the patients History and Physical, chart, labs and discussed the procedure including the risks, benefits and alternatives for the proposed anesthesia with the patient or authorized representative who has indicated his/her understanding and acceptance.   Dental advisory given  Plan Discussed with:   Anesthesia Plan Comments: (I have discussed risks of neuraxial anesthesia including but not limited to infection, bleeding, nerve injury, back pain, headache, seizures, and failure of block. Patient denies bleeding disorders and is not currently anticoagulated. Labs have been reviewed. Risks and benefits discussed. All  patient's questions answered.  )        Anesthesia Quick Evaluation

## 2015-09-20 NOTE — Progress Notes (Signed)
Patient ID: Ashley [name], female   DOB: [birth year], 20 y.o.   MRN: [id number] Ashley [name] is a 20 y.o. G1P0 at 8092w1d.  Subjective: Increased cramping. Requesting pain meds.   Objective: BP 137/84   Pulse 63   Temp 97.9 F (36.6 C) (Oral)   Resp 18   Ht 5\' 8"  (1.727 m)   Wt 157 lb (71.2 kg)   LMP 12/27/2014   BMI 23.87 kg/m    FHT:  FHR: 140 bpm, variability: mod,  accelerations:  15x15,  decelerations:  none UC:   Q 3- 5 minutes, mild Dilation: 3 Effacement (%): 50 Cervical Position: Posterior Station: -3 Presentation: Vertex Exam by:: Katrinka BlazingSmith, CNM  Labs: NA  Assessment / Plan: 3492w1d week IUP Labor: Early/IOL Fetal Wellbeing:  Category I Pain Control:  Fentanyl Anticipated MOD:  SVD Cytotec placed. Plan pitocin in 4 hours  [place] Lubna Stegeman, [place]CNM 09/20/2015 5:08 AM

## 2015-09-20 NOTE — Anesthesia Procedure Notes (Signed)
Epidural Patient location during procedure: OB Start time: 09/20/2015 10:10 AM End time: 09/20/2015 10:15 AM  Staffing Anesthesiologist: Linton RumpALLAN, Jahkai Yandell DICKERSON Performed: anesthesiologist   Preanesthetic Checklist Completed: patient identified, site marked, surgical consent, pre-op evaluation, timeout performed, IV checked, risks and benefits discussed and monitors and equipment checked  Epidural Patient position: sitting Prep: site prepped and draped and DuraPrep Patient monitoring: continuous pulse ox and blood pressure Approach: midline Location: L3-L4 Injection technique: LOR air  Needle:  Needle type: Tuohy  Needle gauge: 17 G Needle length: 9 cm and 9 Needle insertion depth: 5 cm Catheter type: closed end flexible Catheter size: 19 Gauge Catheter at skin depth: 10 cm Test dose: negative  Assessment Events: blood not aspirated, injection not painful, no injection resistance, negative IV test and no paresthesia

## 2015-09-21 MED ORDER — POLYSACCHARIDE IRON COMPLEX 150 MG PO CAPS
150.0000 mg | ORAL_CAPSULE | Freq: Every day | ORAL | Status: DC
  Administered 2015-09-21 – 2015-09-22 (×2): 150 mg via ORAL
  Filled 2015-09-21 (×2): qty 1

## 2015-09-21 NOTE — Progress Notes (Signed)
Post Partum Day #1 Subjective: no complaints, up ad lib, voiding and tolerating PO  Objective: Blood pressure 110/77, pulse 85, temperature 98.5 F (36.9 C), temperature source Oral, resp. rate 18, height 5\' 8"  (1.727 m), weight 157 lb (71.2 kg), last menstrual period 12/27/2014, SpO2 100 %.  Physical Exam:  General: alert, cooperative and no distress Lochia: appropriate Uterine Fundus: firm Incision: no significant drainage, no significant erythema DVT Evaluation: No evidence of DVT seen on physical exam. No cords or calf tenderness. No significant calf/ankle edema.   Recent Labs  09/19/15 1625  HGB 10.9*  HCT 31.9*    Assessment/Plan: Plan for discharge tomorrow, Breastfeeding and Lactation consult  Anemia: iron started   LOS: 2 days   Ashley [name], CNM 09/21/2015, 8:20 AM

## 2015-09-21 NOTE — Progress Notes (Signed)
LCSW aware of consult, attempted to see MOB, but she was visiting with family and friends. Introduced self and will return in the morning for assessment of needs. MOB agreeable and appreciative.  Deretha EmoryHannah Kiernan Farkas LCSW, MSW Clinical Social Work: System Insurance underwriterWide Float Coverage for [employer]Colleen NICU Clinical social worker [phone]

## 2015-09-21 NOTE — Anesthesia Postprocedure Evaluation (Signed)
Anesthesia Post Note  Patient: Ashley [name]  Procedure(s) Performed: * No procedures listed *  Patient location during evaluation: Mother Baby Anesthesia Type: Epidural Level of consciousness: awake and alert and oriented Pain management: satisfactory to patient Vital Signs Assessment: post-procedure vital signs reviewed and stable Respiratory status: spontaneous breathing and nonlabored ventilation Cardiovascular status: stable Postop Assessment: no headache, no backache, no signs of nausea or vomiting, adequate PO intake and patient able to bend at knees (patient up walking) Anesthetic complications: no     Last Vitals:  Vitals:   09/20/15 2145 09/21/15 0530  BP: 138/78 110/77  Pulse: 73 85  Resp: 18 18  Temp: 36.9 C 36.9 C    Last Pain:  Vitals:   09/21/15 0550  TempSrc:   PainSc: 4    Pain Goal: Patients Stated Pain Goal: 3 (09/19/15 2052)               Madison HickmanGREGORY,[name]

## 2015-09-21 NOTE — Lactation Note (Signed)
This note was copied from a baby's chart. Lactation Consultation Note Mom breast formula feeding. Discussed supply and demand. Encouraged to put baby to breast first before formula feeding.  Mom has perky breast w/semi flat nipples, compressible some. Hand expression taught w/much hand massage. RN set up Referred to Baby and Me Book in Breastfeeding section Pg. 22-23 for position options and Proper latch demonstration. Mom stating she didn't have milk d/t she can't see colostrum. Discussed consistency of colostrum and encouraged breat stimulation.Mom encouraged to feed baby 8-12 times/24 hours and with feeding cues.  Educated about newborn behavior, I&O, STS, spoon feeding, supply and demand. WH/LC brochure given w/resources, support groups and LC services. Mom had lots of questions. Patient Name: Boy Christ KickMaKira Bonenfant ZOXWR'UToday's Date: 09/21/2015 Reason for consult: Initial assessment   Maternal Data Has patient been taught Hand Expression?: Yes Does the patient have breastfeeding experience prior to this delivery?: No  Feeding Feeding Type: Formula Length of feed: 0 min  LATCH Score/Interventions Latch: Too sleepy or reluctant, no latch achieved, no sucking elicited.     Type of Nipple: Everted at rest and after stimulation Intervention(s): Double electric pump;Shells        Intervention(s): Skin to skin;Position options;Support Pillows;Breastfeeding basics reviewed     Lactation Tools Discussed/Used Tools: Shells;Pump Shell Type: Inverted Breast pump type: Double-Electric Breast Pump WIC Program: Yes   Consult Status Consult Status: Follow-up Date: 09/21/15 Follow-up type: In-patient    Charyl DancerCARVER, Pershing Skidmore G 09/21/2015, 5:40 AM

## 2015-09-22 MED ORDER — VITAFOL FE+ 90-1-200 & 50 MG PO CPPK: 2.0000 | ORAL_CAPSULE | Freq: Every day | ORAL | 12 refills | Status: DC

## 2015-09-22 MED ORDER — IBUPROFEN 600 MG PO TABS: 600.0000 mg | ORAL_TABLET | Freq: Four times a day (QID) | ORAL | 2 refills | Status: DC

## 2015-09-22 MED ORDER — OXYCODONE-ACETAMINOPHEN 5-325 MG PO TABS: 1.0000 | ORAL_TABLET | ORAL | 0 refills | Status: DC | PRN

## 2015-09-22 MED ORDER — COCONUT OIL OIL: 1.0000 "application " | TOPICAL_OIL | 99 refills | Status: DC | PRN

## 2015-09-22 MED ORDER — SENNOSIDES-DOCUSATE SODIUM 8.6-50 MG PO TABS: 2.0000 | ORAL_TABLET | Freq: Two times a day (BID) | ORAL | 2 refills | Status: DC

## 2015-09-22 NOTE — Plan of Care (Signed)
Problem: Education: Goal: Knowledge of condition will improve Outcome: Completed/Met Date Met: 09/22/15 Given dermoplast for sore perineum

## 2015-09-22 NOTE — Discharge Summary (Signed)
Obstetric Discharge Summary Reason for Admission: Preeclampsia Prenatal Procedures: NST and ultrasound Intrapartum Procedures: spontaneous vaginal delivery Postpartum Procedures: none Complications-Operative and Postpartum: 1st  degree perineal laceration Hemoglobin  Date Value Ref Range Status  09/19/2015 10.9 (L) 12.0 - 15.0 g/dL Final   HCT  Date Value Ref Range Status  09/19/2015 31.9 (L) 36.0 - 46.0 % Final   Hematocrit  Date Value Ref Range Status  07/12/2015 33.9 (L) 34.0 - 46.6 % Final    Physical Exam:  General: alert, cooperative and no distress Lochia: appropriate Uterine Fundus: firm Incision: no significant drainage, no significant erythema DVT Evaluation: No evidence of DVT seen on physical exam. No cords or calf tenderness. No significant calf/ankle edema.  Discharge Diagnoses: Term Pregnancy-delivered and Preelampsia  Discharge Information: Date: 09/22/2015 Activity: pelvic rest Diet: routine Medications: PNV, Ibuprofen, Colace, Iron and Percocet Condition: stable Instructions: refer to practice specific booklet Discharge to: home Follow-up Information    Marlaya Turck A Hind Chesler, CNM Follow up in 2 week(s).   Specialty:  Certified Nurse Midwife Why:  Postpartum exam Contact information: [address] [place] [address] [phone]           Newborn Data: Live born female  Birth Weight: 5 lb 13.1 oz (2639 g) APGAR: 9,   Home with mother.  Roe Coombsachelle A Eoghan Belcher, CNM 09/22/2015, 8:08 AM

## 2015-09-22 NOTE — Progress Notes (Signed)
Post Partum Day #2 Subjective: no complaints, up ad lib, voiding and tolerating PO  Objective: Blood pressure (!) 116/54, pulse 67, temperature 98 F (36.7 C), temperature source Oral, resp. rate 16, height 5\' 8"  (1.727 m), weight 157 lb (71.2 kg), last menstrual period 12/27/2014, SpO2 100 %.  Physical Exam:  General: alert, cooperative and no distress Lochia: appropriate Uterine Fundus: firm Incision: no significant drainage, no significant erythema DVT Evaluation: No evidence of DVT seen on physical exam. No cords or calf tenderness. No significant calf/ankle edema.   Recent Labs  09/19/15 1625  HGB 10.9*  HCT 31.9*    Assessment/Plan: Discharge home and Breastfeeding  Anemia: on iron   LOS: 3 days   Ashley [name], CNM 09/22/2015, 8:00 AM

## 2015-09-22 NOTE — Discharge Instructions (Signed)
Hypertension During Pregnancy Hypertension is also called high blood pressure. Blood pressure moves blood in your body. Sometimes, the force that moves the blood becomes too strong. When you are pregnant, this condition should be watched carefully. It can cause problems for you and your baby. HOME CARE   Make and keep all of your doctor visits.  Take medicine as told by your doctor. Tell your doctor about all medicines you take.  Eat very little salt.  Exercise regularly.  Do not drink alcohol.  Do not smoke.  Do not have drinks with caffeine.  Lie on your left side when resting.  Your health care provider may ask you to take one low-dose aspirin (81mg ) each day. GET HELP RIGHT AWAY IF:  You have bad belly (abdominal) pain.  You have sudden puffiness (swelling) in the hands, ankles, or face.  You gain 4 pounds (1.8 kilograms) or more in 1 week.  You throw up (vomit) repeatedly.  You have bleeding from the vagina.  You do not feel the baby moving as much.  You have a headache.  You have blurred or double vision.  You have muscle twitching or spasms.  You have shortness of breath.  You have blue fingernails and lips.  You have blood in your pee (urine). MAKE SURE YOU:  Understand these instructions.  Will watch your condition.  Will get help right away if you are not doing well or get worse.   This information is not intended to replace advice given to you by your health care provider. Make sure you discuss any questions you have with your health care provider.   Document Released: 02/23/2010 Document Revised: 02/11/2014 Document Reviewed: 08/20/2012 Elsevier Interactive Patient Education 2016 Elsevier Inc.  Postpartum Hypertension Postpartum hypertension is high blood pressure after pregnancy that remains higher than normal for more than two days after delivery. You may not realize that you have postpartum hypertension if your blood pressure is not being  checked regularly. In some cases, postpartum hypertension will go away on its own, usually within a week of delivery. However, for some women, medical treatment is required to prevent serious complications, such as seizures or stroke. The following things can affect your blood pressure:  The type of delivery you had.  Having received IV fluids or other medicines during or after delivery. CAUSES  Postpartum hypertension may be caused by any of the following or by a combination of any of the following:  Hypertension that existed before pregnancy (chronic hypertension).  Gestational hypertension.  Preeclampsia or eclampsia.  Receiving a lot of fluid through an IV during or after delivery.  Medicines.  HELLP syndrome.  Hyperthyroidism.  Stroke.  Other rare neurological or blood disorders. In some cases, the cause may not be known. RISK FACTORS Postpartum hypertension can be related to one or more risk factors, such as:  Chronic hypertension. In some cases, this may not have been diagnosed before pregnancy.  Obesity.  Type 2 diabetes.  Kidney disease.  Family history of preeclampsia.  Other medical conditions that cause hormonal imbalances. SIGNS AND SYMPTOMS As with all types of hypertension, postpartum hypertension may not have any symptoms. Depending on how high your blood pressure is, you may experience:  Headaches. These may be mild, moderate, or severe. They may also be steady, constant, or sudden in onset (thunderclap headache).  Visual changes.  Dizziness.  Shortness of breath.  Swelling of your hands, feet, lower legs, or face. In some cases, you may have swelling in  more than one of these locations.  Heart palpitations or a racing heartbeat.  Difficulty breathing while lying down.  Decreased urination. Other rare signs and symptoms may include:  Sweating more than usual. This lasts longer than a few days after delivery.  Chest pain.  Sudden  dizziness when you get up from sitting or lying down.  Seizures.  Nausea or vomiting.  Abdominal pain. DIAGNOSIS The diagnosis of postpartum hypertension is made through a combination of physical examination findings and testing of your blood and urine. You may also have additional tests, such as a CT scan or an MRI, to check for other complications of postpartum hypertension. TREATMENT When blood pressure is high enough to require treatment, your options may include:  Medicines to reduce blood pressure (antihypertensives). Tell your health care provider if you are breastfeeding or if you plan to breastfeed. There are many antihypertensive medicines that are safe to take while breastfeeding.  Stopping medicines that may be causing hypertension.  Treating medical conditions that are causing hypertension.  Treating the complications of hypertension, such as seizures, stroke, or kidney problems. Your health care provider will also continue to monitor your blood pressure closely and repeatedly until it is within a safe range for you.  HOME CARE INSTRUCTIONS  Take medicines only as directed by your health care provider.  Get regular exercise after your health care provider tells you that it is safe.  Follow your health care provider's recommendations on fluid and salt restrictions.  Do not use any tobacco products, including cigarettes, chewing tobacco, or electronic cigarettes. If you need help quitting, ask your health care provider.  Keep all follow-up visits as directed by your health care provider. This is important. SEEK MEDICAL CARE IF:  Your symptoms get worse.  You have new symptoms, such as:  Headache.  Dizziness.  Visual changes. SEEK IMMEDIATE MEDICAL CARE IF:  You develop a severe or sudden headache.  You have seizures.  You develop numbness or weakness on one side of your body.  You have difficulty thinking, speaking, or swallowing.  You develop severe  abdominal pain.  You develop difficulty breathing, chest pain, a racing heartbeat, or heart palpitations. These symptoms may represent a serious problem that is an emergency. Do not wait to see if the symptoms will go away. Get medical help right away. Call your local emergency services (911 in the [place] Do not drive yourself to the hospital.   This information is not intended to replace advice given to you by your health care provider. Make sure you discuss any questions you have with your health care provider.   Document Released: 09/24/2013 Document Reviewed: 09/24/2013 Elsevier Interactive Patient Education 2016 [employer].  Preeclampsia and Eclampsia Preeclampsia is a serious condition that develops only during pregnancy. It is also called toxemia of pregnancy. This condition causes high blood pressure along with other symptoms, such as swelling and headaches. These may develop as the condition gets worse. Preeclampsia may occur 20 weeks or later into your pregnancy.  Diagnosing and treating preeclampsia early is very important. If not treated early, it can cause serious problems for you and your baby. One problem it can lead to is eclampsia, which is a condition that causes muscle jerking or shaking (convulsions) in the mother. Delivering your baby is the best treatment for preeclampsia or eclampsia.  RISK FACTORS The cause of preeclampsia is not known. You may be more likely to develop preeclampsia if you have certain risk factors. These include:  Being pregnant for the first time.  Having preeclampsia in a past pregnancy.  Having a family history of preeclampsia.  Having high blood pressure.  Being pregnant with twins or triplets.  Being 5735 or older.  Being African American.  Having kidney disease or diabetes.  Having medical conditions such as lupus or blood diseases.  Being very overweight (obese). SIGNS AND SYMPTOMS  The earliest signs of preeclampsia are:  High  blood pressure.  Increased protein in your urine. Your health care provider will check for this at every prenatal visit. Other symptoms that can develop include:   Severe headaches.  Sudden weight gain.  Swelling of your hands, face, legs, and feet.  Feeling sick to your stomach (nauseous) and throwing up (vomiting).  Vision problems (blurred or double vision).  Numbness in your face, arms, legs, and feet.  Dizziness.  Slurred speech.  Sensitivity to bright lights.  Abdominal pain. DIAGNOSIS  There are no screening tests for preeclampsia. Your health care provider will ask you about symptoms and check for signs of preeclampsia during your prenatal visits. You may also have tests, including:  Urine testing.  Blood testing.  Checking your baby's heart rate.  Checking the health of your baby and your placenta using images created with sound waves (ultrasound). TREATMENT  You can work out the best treatment approach together with your health care provider. It is very important to keep all prenatal appointments. If you have an increased risk of preeclampsia, you may need more frequent prenatal exams.  Your health care provider may prescribe bed rest.  You may have to eat as little salt as possible.  You may need to take medicine to lower your blood pressure if the condition does not respond to more conservative measures.  You may need to stay in the hospital if your condition is severe. There, treatment will focus on controlling your blood pressure and fluid retention. You may also need to take medicine to prevent seizures.  If the condition gets worse, your baby may need to be delivered early to protect you and the baby. You may have your labor started with medicine (be induced), or you may have a cesarean delivery.  Preeclampsia usually goes away after the baby is born. HOME CARE INSTRUCTIONS   Only take over-the-counter or prescription medicines as directed by your  health care provider.  Lie on your left side while resting. This keeps pressure off your baby.  Elevate your feet while resting.  Get regular exercise. Ask your health care provider what type of exercise is safe for you.  Avoid caffeine and alcohol.  Do not smoke.  Drink 6-8 glasses of water every day.  Eat a balanced diet that is low in salt. Do not add salt to your food.  Avoid stressful situations as much as possible.  Get plenty of rest and sleep.  Keep all prenatal appointments and tests as scheduled. SEEK MEDICAL CARE IF:  You are gaining more weight than expected.  You have any headaches, abdominal pain, or nausea.  You are bruising more than usual.  You feel dizzy or light-headed. SEEK IMMEDIATE MEDICAL CARE IF:   You develop sudden or severe swelling anywhere in your body. This usually happens in the legs.  You gain 5 lb (2.3 kg) or more in a week.  You have a severe headache, dizziness, problems with your vision, or confusion.  You have severe abdominal pain.  You have lasting nausea or vomiting.  You have a seizure.  You have trouble moving any part of your body.  You develop numbness in your body.  You have trouble speaking.  You have any abnormal bleeding.  You develop a stiff neck.  You pass out. MAKE SURE YOU:   Understand these instructions.  Will watch your condition.  Will get help right away if you are not doing well or get worse.   This information is not intended to replace advice given to you by your health care provider. Make sure you discuss any questions you have with your health care provider.   Document Released: 01/19/2000 Document Revised: 01/26/2013 Document Reviewed: 11/13/2012 Elsevier Interactive Patient Education [employer]2016 Elsevier Inc.

## 2015-09-22 NOTE — Lactation Note (Signed)
This note was copied from a baby's chart. Lactation Consultation Note  Assisted mother w/ latching baby w/ 5 FR feeding tube at the breast. Baby latched easily.  Sucks and swallows observed for 20 min.  LS9. Baby took 12 ml with feeding system at breast. Reviewed volume guidelines and cleaning. Provided mother w/ another hand pump so she can post pump and give baby back volume at next feeding. Provided mother with larger volume syringe. Reviewed engorgement care and monitoring voids/stools. Recommend breastfeeding on both breasts if baby will sustain latch. Set up OP appointment for 8/24 at 1pm. [facility]   Patient Name: Boy Christ KickMaKira Pape ZOXWR'UToday's Date: 09/22/2015 Reason for consult: Follow-up assessment   Maternal Data    Feeding Feeding Type: Breast Fed Length of feed: 20 min  LATCH Score/Interventions Latch: Grasps breast easily, tongue down, lips flanged, rhythmical sucking.  Audible Swallowing: Spontaneous and intermittent  Type of Nipple: Everted at rest and after stimulation  Comfort (Breast/Nipple): Soft / non-tender     Hold (Positioning): Assistance needed to correctly position infant at breast and maintain latch.  LATCH Score: 9  Lactation Tools Discussed/Used Tools: 54F feeding tube / Syringe   Consult Status Consult Status: Follow-up Date: 09/28/15 Follow-up type: Out-patient    Dahlia ByesBerkelhammer, Ruth [facility] 09/22/2015, 2:07 PM

## 2015-09-22 NOTE — Clinical Social Work Maternal (Signed)
  CLINICAL SOCIAL WORK MATERNAL/CHILD NOTE  Patient Details  Name: Christ KickMaKira Harpham MRN: [id number] Date of Birth: February 19, 1995  Date:  09/22/2015  Clinical Social Worker Initiating Note:  Raye SorrowHannah N Jameela Michna, LCSW Date/ Time Initiated:  09/22/15/1104     Child's Name:  Freddy JakschJermiah   Legal Guardian:  Mother   Need for Interpreter:  None   Date of Referral:  09/22/15     Reason for Referral:  Other (Comment) (?situational anxiety/depression)   Referral Source:  Physician   Address:     Phone number:      Household Members:  Parents   Natural Supports (not living in the home):  Extended Family, Friends, Spouse/significant other, Immediate Family   Professional Supports: None   Employment: Consulting civil engineertudent   Type of Work: Pharmacist, [employer] studying Special Education   Education:  Holiday representativeAttending college   Financial Resources:  [employer]   Other Resources:  [employer]IC, Sales executiveood Stamps    Cultural/Religious Considerations Which May Impact Care:  none  Strengths:  Ability to meet basic needs , Compliance with medical plan , Home prepared for child , Pediatrician chosen    Risk Factors/Current Problems:  None   Cognitive State:  Alert , Insightful , Goal Oriented , Linear Thinking    Mood/Affect:  Happy , Calm , Comfortable    CSW Assessment: LCSW received consult for anxiety and depression/situation.  MOB was very welcoming and appreciative of visit. She explains that this was all miscommunication as she went into prenatal care asking for suppression medication and it was dictated as "depression" medication and she was prescribed Zoloft.  She reports she did not take medication and was not depressed. She had a full affect, smiling and bonding with baby. She reports no stressors at this time other than adjusting to sleep pattern and feeling tired.  She reports no hx of MH or symptoms.  LCSW still reviewed PPD as this is mothers first child, she is a full time Consulting civil engineerstudent, and works.  Discussed setting  boundaries, taking care of self and using support. MOB has strong support with family and FOB. She reports excitement with being a mother and appreciative of time and information shared.  CSW Plan/Description:  Patient/Family Education , No Further Intervention Required/No Barriers to Discharge    Raye SorrowCoble, Katie Moch N, LCSW 09/22/2015, 11:18 AM

## 2015-09-28 ENCOUNTER — Ambulatory Visit (HOSPITAL_COMMUNITY): Payer: Medicaid Other

## 2015-10-04 ENCOUNTER — Ambulatory Visit (INDEPENDENT_AMBULATORY_CARE_PROVIDER_SITE_OTHER): Payer: Medicaid Other | Admitting: Certified Nurse Midwife

## 2015-10-04 ENCOUNTER — Encounter: Payer: Self-pay | Admitting: Certified Nurse Midwife

## 2015-10-04 VITALS — BP 139/94 | HR 73 | Temp 98.5°F | Wt 134.6 lb

## 2015-10-04 DIAGNOSIS — Z3042 Encounter for surveillance of injectable contraceptive: Secondary | ICD-10-CM | POA: Diagnosis not present

## 2015-10-04 DIAGNOSIS — Z8759 Personal history of other complications of pregnancy, childbirth and the puerperium: Secondary | ICD-10-CM

## 2015-10-04 DIAGNOSIS — Z30013 Encounter for initial prescription of injectable contraceptive: Secondary | ICD-10-CM

## 2015-10-04 MED ORDER — CARVEDILOL PHOSPHATE ER 20 MG PO CP24: 20.0000 mg | ORAL_CAPSULE | Freq: Every day | ORAL | 5 refills | Status: DC

## 2015-10-04 MED ORDER — MEDROXYPROGESTERONE ACETATE 150 MG/ML IM SUSP
150.0000 mg | Freq: Once | INTRAMUSCULAR | Status: AC
  Administered 2015-10-04: 150 mg via INTRAMUSCULAR

## 2015-10-04 MED ORDER — MEDROXYPROGESTERONE ACETATE 150 MG/ML IM SUSP: 150.0000 mg | INTRAMUSCULAR | 4 refills | Status: DC

## 2015-10-04 NOTE — Addendum Note (Signed)
Addended by: Samantha CrimesENNEY, RACHELLE ANNE on: 10/04/2015 03:21 PM   Modules accepted: Orders

## 2015-10-04 NOTE — Progress Notes (Signed)
Subjective:     Ashley [name] is a 20 y.o. female who presents for a postpartum visit. She is 2 weeks postpartum following a spontaneous vaginal delivery. I have fully reviewed the prenatal and intrapartum course. The delivery was at 38 gestational weeks. Outcome: spontaneous vaginal delivery. Anesthesia: epidural. Postpartum course has been complicated by hypertension. Baby's course has been normal. Baby is feeding by breast. Bleeding thin lochia, brown and red. Bowel function is normal. Bladder function is normal. Patient is not sexually active. Contraception method is abstinence. Postpartum depression screening: negative.  Tobacco, alcohol and substance abuse history reviewed.  Adult immunizations reviewed including TDAP, rubella and varicella.  The following portions of the patient's history were reviewed and updated as appropriate: allergies, current medications, past family history, past medical history, past social history, past surgical history and problem list.  Review of Systems Pertinent items are noted in HPI.   Objective:    BP (!) 139/94   Pulse 73   Temp 98.5 F (36.9 C)   Wt 134 lb 9.6 oz (61.1 kg)   LMP 12/27/2014   Breastfeeding? Unknown   BMI 20.47 kg/m   General:  alert, cooperative and no distress   Breasts:  negative  Lungs: clear to auscultation bilaterally  Heart:  regular rate and rhythm, S1, S2 normal, no murmur, click, rub or gallop  Abdomen: soft, non-tender; bowel sounds normal; no masses,  no organomegaly  Rectal Exam: Not performed.          50% of 20 min visit spent on counseling and coordination of care.  Assessment:     Normal 2 week postpartum exam. Pap smear not done at today's visit.     H/O preeclampsia   Contraception counseling  Plan:    1. Contraception: abstinence 2.  Depo injections ordered.   3. Started on coreg for elevated blood pressure 4. Follow up in: 4 weeks or as needed.  2hr GTT for h/o GDM/screening for DM q 3 yrs per ADA  recommendations Preconception counseling provided Healthy lifestyle practices reviewed

## 2015-10-04 NOTE — Addendum Note (Signed)
Addended by: Francene FindersJAMES, QUINETTA C on: 10/04/2015 04:47 PM   Modules accepted: Orders

## 2015-10-04 NOTE — Patient Instructions (Signed)
Patient was advised to RTO 12/26/15 for next injection. She voiced understanding.

## 2015-10-13 ENCOUNTER — Other Ambulatory Visit: Payer: Self-pay | Admitting: *Deleted

## 2015-10-13 ENCOUNTER — Telehealth: Payer: Self-pay | Admitting: *Deleted

## 2015-10-13 DIAGNOSIS — Z8759 Personal history of other complications of pregnancy, childbirth and the puerperium: Secondary | ICD-10-CM

## 2015-10-13 MED ORDER — CARVEDILOL 25 MG PO TABS: 25.0000 mg | ORAL_TABLET | Freq: Two times a day (BID) | ORAL | 1 refills | Status: DC

## 2015-10-13 MED ORDER — CARVEDILOL PHOSPHATE ER 20 MG PO CP24: 20.0000 mg | ORAL_CAPSULE | Freq: Every day | ORAL | 5 refills | Status: DC

## 2015-10-13 NOTE — Progress Notes (Signed)
Change in Coreg CR 20mg  Rx due to insurance coverage.  Reviewed with R.Denney, CNM- generic Coreg to be sent to pharmacy. Coreg 25mg  BID was ordered to pharmacy.

## 2015-10-13 NOTE — Telephone Encounter (Signed)
Placed call to pt. LM on VM making her aware that her Coreg Rx will be covered by insurance and pharmacy should have for her to pick up.

## 2015-10-19 ENCOUNTER — Telehealth: Payer: Self-pay | Admitting: *Deleted

## 2015-10-19 NOTE — Telephone Encounter (Signed)
Infant has thrush.Patient c/o breast pain.Denies fever,redness,or firmness that is not relieved after nursing.Nipples are sore denies cracks or bleeding.Nystatin 1:100,000 strength called to [facility] [phone].

## 2015-11-01 ENCOUNTER — Ambulatory Visit (INDEPENDENT_AMBULATORY_CARE_PROVIDER_SITE_OTHER): Payer: Medicaid Other | Admitting: Certified Nurse Midwife

## 2015-11-01 ENCOUNTER — Encounter: Payer: Self-pay | Admitting: Certified Nurse Midwife

## 2015-11-01 VITALS — BP 123/84 | HR 69 | Temp 98.2°F | Wt 130.6 lb

## 2015-11-01 DIAGNOSIS — O1495 Unspecified pre-eclampsia, complicating the puerperium: Secondary | ICD-10-CM

## 2015-11-01 DIAGNOSIS — Z3009 Encounter for other general counseling and advice on contraception: Secondary | ICD-10-CM

## 2015-11-01 NOTE — Progress Notes (Signed)
Subjective:     Ashley [name] is a 20 y.o. female who presents for a postpartum visit. She is 4 weeks postpartum following a spontaneous vaginal delivery. I have fully reviewed the prenatal and intrapartum course. The delivery was at 38.1 gestational weeks. Outcome: spontaneous vaginal delivery. Anesthesia: epidural. Postpartum course has been complicated by elevated blood pressures. Baby's course has been normal. Baby is feeding by breast. Bleeding thin lochia, brown and red. Bowel function is normal. Bladder function is normal. Patient is not sexually active. Contraception method is abstinence and Depo-Provera injections. Postpartum depression screening: negative; Edinburgh scale of 3.  Tobacco, alcohol and substance abuse history reviewed.  Adult immunizations reviewed including TDAP, rubella and varicella.  The following portions of the patient's history were reviewed and updated as appropriate: allergies, current medications, past family history, past medical history, past social history, past surgical history and problem list.  Review of Systems Pertinent items noted in HPI and remainder of comprehensive ROS otherwise negative.   Objective:    LMP 12/27/2014   General:  alert, cooperative and no distress   Breasts:  inspection negative, no nipple discharge or bleeding, no masses or nodularity palpable  Lungs: clear to auscultation bilaterally  Heart:  regular rate and rhythm, S1, S2 normal, no murmur, click, rub or gallop  Abdomen: soft, non-tender; bowel sounds normal; no masses,  no organomegaly   Vulva:  normal          50% of 15 min visit spent on counseling and coordination of care.  Assessment:     Normal 4 week postpartum exam. Pap smear not done at today's visit.     Contraception management: on depo injections   H/O preeclampsia in the postpartum period: stable on Coreg BID  Plan:    1. Contraception: abstinence and Depo-Provera injections 2.  Stable B/P today in office.    3. Follow up in: 1 month for annual exam or as needed.  2hr GTT for h/o GDM/screening for DM q 3 yrs per ADA recommendations Preconception counseling provided Healthy lifestyle practices reviewed

## 2015-12-05 ENCOUNTER — Encounter: Payer: Self-pay | Admitting: Certified Nurse Midwife

## 2015-12-26 ENCOUNTER — Encounter: Payer: Self-pay | Admitting: Certified Nurse Midwife

## 2015-12-26 ENCOUNTER — Other Ambulatory Visit (HOSPITAL_COMMUNITY)
Admission: RE | Admit: 2015-12-26 | Discharge: 2015-12-26 | Disposition: A | Discharge status: Home / Self Care | Payer: Medicaid Other | Source: Ambulatory Visit | Attending: Obstetrics | Admitting: Obstetrics

## 2015-12-26 ENCOUNTER — Ambulatory Visit (INDEPENDENT_AMBULATORY_CARE_PROVIDER_SITE_OTHER): Payer: Medicaid Other | Admitting: Certified Nurse Midwife

## 2015-12-26 ENCOUNTER — Ambulatory Visit: Payer: Self-pay

## 2015-12-26 VITALS — BP 109/69 | HR 73 | Ht 68.0 in | Wt 131.0 lb

## 2015-12-26 DIAGNOSIS — Z30013 Encounter for initial prescription of injectable contraceptive: Secondary | ICD-10-CM | POA: Insufficient documentation

## 2015-12-26 DIAGNOSIS — Z1151 Encounter for screening for human papillomavirus (HPV): Secondary | ICD-10-CM

## 2015-12-26 DIAGNOSIS — Z124 Encounter for screening for malignant neoplasm of cervix: Secondary | ICD-10-CM

## 2015-12-26 DIAGNOSIS — N921 Excessive and frequent menstruation with irregular cycle: Secondary | ICD-10-CM | POA: Diagnosis not present

## 2015-12-26 DIAGNOSIS — Z113 Encounter for screening for infections with a predominantly sexual mode of transmission: Secondary | ICD-10-CM

## 2015-12-26 DIAGNOSIS — Z01419 Encounter for gynecological examination (general) (routine) without abnormal findings: Secondary | ICD-10-CM | POA: Diagnosis present

## 2015-12-26 MED ORDER — MEDROXYPROGESTERONE ACETATE 150 MG/ML IM SUSP
150.0000 mg | Freq: Once | INTRAMUSCULAR | Status: AC
  Administered 2015-12-26: 150 mg via INTRAMUSCULAR

## 2015-12-26 MED ORDER — MEDROXYPROGESTERONE ACETATE 150 MG/ML IM SUSP: 150.0000 mg | INTRAMUSCULAR | 4 refills | Status: DC

## 2015-12-26 NOTE — Addendum Note (Signed)
Addended by: Marya LandryFOSTER, Jaycelynn Knickerbocker D on: 12/26/2015 12:19 PM   Modules accepted: Orders

## 2015-12-26 NOTE — Progress Notes (Signed)
Subjective:      Ashley [name] is a 20 y.o. female here for a routine exam.  Current complaints: none.  Had a baby 09/19/15.  Is due for depo injection.  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 Just started a period.     Is currently breast feeding.    Personal health questionnaire:  Is patient [name], have a family history of breast and/or ovarian cancer: no Is there a family history of uterine cancer diagnosed at age < 2850, gastrointestinal cancer, urinary tract cancer, family member who is a Personnel officerLynch syndrome-associated carrier: no Is the patient overweight and hypertensive, family history of diabetes, personal history of gestational diabetes, preeclampsia or PCOS: no Is patient over 1255, have PCOS,  family history of premature CHD under age 22, diabetes, smoke, have hypertension or peripheral artery disease:  yes At any time, has a partner hit, kicked or otherwise hurt or frightened you?: no Over the past 2 weeks, have you felt down, depressed or hopeless?: no Over the past 2 weeks, have you felt little interest or pleasure in doing things?:no   Gynecologic History Patient's last menstrual period was 12/25/2015. Contraception: Depo-Provera injections Last Pap: n/a; 1st pap smear Last mammogram: n/a.  Obstetric History OB History  Gravida Para Term Preterm AB Living  1 1 1     1   SAB TAB Ectopic Multiple Live Births          1    # Outcome Date GA Lbr Len/2nd Weight Sex Delivery Anes PTL Lv  1 Term 09/20/15 7078w1d  5 lb 13.1 oz (2.639 kg) M Vag-Spont EPI  LIV      Past Medical History:  Diagnosis Date  . Medical history non-contributory     Past Surgical History:  Procedure Laterality Date  . NO PAST SURGERIES       Current Outpatient Prescriptions:  .  medroxyPROGESTERone (DEPO-PROVERA) 150 MG/ML injection, Inject 1 mL (150 mg total) into the muscle every 3 (three) months., Disp: 1 mL, Rfl: 4 .  coconut oil OIL, Apply 1 application topically as  needed., Disp: 500 mL, Rfl: PRN .  Prenat-FePoly-Metf-FA-DHA-DSS (VITAFOL FE+) 90-1-200 & 50 MG CPPK, Take 2 tablets by mouth daily. (Patient not taking: Reported on 12/26/2015), Disp: 60 each, Rfl: 12 .  valACYclovir (VALTREX) 500 MG tablet, Take 500 mg by mouth 2 (two) times daily., Disp: , Rfl:  Allergies  Allergen Reactions  . Penicillins Hives and Rash    Foamed from mouth Has patient had a PCN reaction causing immediate rash, facial/tongue/throat swelling, SOB or lightheadedness with hypotension: Yes Has patient had a PCN reaction causing severe rash involving mucus membranes or skin necrosis: No Has patient had a PCN reaction that required hospitalization No Has patient had a PCN reaction occurring within the last 10 years: No If all of the above answers are "NO", then may proceed with Cephalosporin use.     Social History  Substance Use Topics  . Smoking status: Never Smoker  . Smokeless tobacco: Never Used  . Alcohol use No    Family History  Problem Relation Age of Onset  . Diabetes Mother   . Hypertension Mother       Review of Systems  Constitutional: negative for fatigue and weight loss Respiratory: negative for cough and wheezing Cardiovascular: negative for chest pain, fatigue and palpitations Gastrointestinal: negative for abdominal pain and change in bowel habits Musculoskeletal:negative for myalgias Neurological: negative for gait problems and tremors Behavioral/Psych: negative for  abusive relationship, depression Endocrine: negative for temperature intolerance    Genitourinary:negative for abnormal menstrual periods, genital lesions, hot flashes, sexual problems and vaginal discharge Integument/breast: negative for breast lump, breast tenderness, nipple discharge and skin lesion(s)    Objective:       BP 109/69   Pulse 73   Ht 5\' 8"  (1.727 m) Comment: 5'8  Wt 131 lb (59.4 kg)   LMP 12/25/2015   BMI 19.92 kg/m  General:   alert  Skin:   no rash or  abnormalities  Lungs:   clear to auscultation bilaterally  Heart:   regular rate and rhythm, S1, S2 normal, no murmur, click, rub or gallop  Breasts:   normal without suspicious masses, skin or nipple changes or axillary nodes  Abdomen:  normal findings: no organomegaly, soft, non-tender and no hernia  Pelvis:  External genitalia: normal general appearance Urinary system: urethral meatus normal and bladder without fullness, nontender Vaginal: normal without tenderness, induration or masses Cervix: normal appearance Adnexa: normal bimanual exam Uterus: anteverted and non-tender, normal size   Lab Review Urine pregnancy test Labs reviewed yes Radiologic studies reviewed no  50% of 30 min visit spent on counseling and coordination of care.    Assessment:    Healthy female exam.   STD screening exam  Contraception management  Irregular bleeding with Depo injections  Plan:    Education reviewed: calcium supplements, depression evaluation, low fat, low cholesterol diet, safe sex/STD prevention, self breast exams, skin cancer screening and weight bearing exercise. Contraception: Depo-Provera injections. Follow up in: 1 year.   Meds ordered this encounter  Medications  . medroxyPROGESTERone (DEPO-PROVERA) 150 MG/ML injection    Sig: Inject 1 mL (150 mg total) into the muscle every 3 (three) months.    Dispense:  1 mL    Refill:  4   Orders Placed This Encounter  Procedures  . NuSwab Vaginitis Plus (VG+)   Possible management options include:LARK Follow up as needed.

## 2015-12-29 LAB — CYTOLOGY - PAP: Diagnosis: NEGATIVE

## 2015-12-29 LAB — NUSWAB VAGINITIS PLUS (VG+)
CANDIDA ALBICANS, NAA: NEGATIVE
CANDIDA GLABRATA, NAA: NEGATIVE
CHLAMYDIA TRACHOMATIS, NAA: NEGATIVE
NEISSERIA GONORRHOEAE, NAA: NEGATIVE
Trich vag by NAA: NEGATIVE

## 2016-02-23 ENCOUNTER — Other Ambulatory Visit: Payer: Self-pay | Admitting: Certified Nurse Midwife

## 2016-02-23 ENCOUNTER — Telehealth: Payer: Self-pay | Admitting: *Deleted

## 2016-02-23 NOTE — Telephone Encounter (Signed)
Please let her know that she can come by the office and pick a pack of pills (LOLO) to help with the bleeding or we can try provera to help the bleeding stop.  Thank you.  R.Brelyn Woehl CNM

## 2016-02-23 NOTE — Telephone Encounter (Signed)
Pt called to office asking for return call. Returned call to pt.  Pt state that she has been on cycle since 01/24/16. Pt states that she had a delivery in August and has been given 2 depo injections since. Pt would like to know what to do about bleeding. ? Appt, ? Medication..    Please advise on recommendation

## 2016-02-29 ENCOUNTER — Other Ambulatory Visit: Payer: Self-pay | Admitting: *Deleted

## 2016-02-29 MED ORDER — MEDROXYPROGESTERONE ACETATE 10 MG PO TABS: ORAL_TABLET | ORAL | 0 refills | Status: DC

## 2016-02-29 NOTE — Telephone Encounter (Signed)
Spoke with pt, was given recommendations. Pt would like medication to be sent to pharmacy. Provera to be ordered.

## 2016-02-29 NOTE — Progress Notes (Signed)
See phone note. Provera sent for AUB per R.Denney.

## 2016-04-10 ENCOUNTER — Other Ambulatory Visit (HOSPITAL_COMMUNITY)
Admission: RE | Admit: 2016-04-10 | Discharge: 2016-04-10 | Disposition: A | Discharge status: Home / Self Care | Payer: Medicaid Other | Source: Ambulatory Visit | Attending: Obstetrics | Admitting: Obstetrics

## 2016-04-10 ENCOUNTER — Encounter: Payer: Self-pay | Admitting: Obstetrics

## 2016-04-10 ENCOUNTER — Ambulatory Visit (INDEPENDENT_AMBULATORY_CARE_PROVIDER_SITE_OTHER): Payer: Medicaid Other | Admitting: Obstetrics

## 2016-04-10 VITALS — BP 122/77 | HR 71 | Ht 67.5 in | Wt 135.0 lb

## 2016-04-10 DIAGNOSIS — Z30011 Encounter for initial prescription of contraceptive pills: Secondary | ICD-10-CM | POA: Diagnosis not present

## 2016-04-10 DIAGNOSIS — Z113 Encounter for screening for infections with a predominantly sexual mode of transmission: Secondary | ICD-10-CM | POA: Insufficient documentation

## 2016-04-10 DIAGNOSIS — N939 Abnormal uterine and vaginal bleeding, unspecified: Secondary | ICD-10-CM

## 2016-04-10 DIAGNOSIS — A6004 Herpesviral vulvovaginitis: Secondary | ICD-10-CM | POA: Diagnosis not present

## 2016-04-10 MED ORDER — NORETHINDRONE-ETH ESTRADIOL 1-35 MG-MCG PO TABS: 1.0000 | ORAL_TABLET | Freq: Every day | ORAL | 11 refills | Status: DC

## 2016-04-10 MED ORDER — VALACYCLOVIR HCL 500 MG PO TABS: ORAL_TABLET | ORAL | 99 refills | Status: DC

## 2016-04-10 NOTE — Progress Notes (Signed)
Pt presents for AUB UT bleeding. She bled "like a full period" from 12/20-2/21. Took Provera 1/25 and bleeding stopped aprx. 2/21. Pt is not currently on BC.

## 2016-04-10 NOTE — Progress Notes (Signed)
Patient ID: Ashley [name], female   DOB: 19-Dec-1995, 21 y.o.   MRN: [id number]  No chief complaint on file.   HPI Ashley Harland DingwallSettle is a 21 y.o. female.  Abnormal vaginal bleeding on Depo, resolved with High Dose Provera p.o.  HPI  Past Medical History:  Diagnosis Date  . Medical history non-contributory     Past Surgical History:  Procedure Laterality Date  . NO PAST SURGERIES      Family History  Problem Relation Age of Onset  . Diabetes Mother   . Hypertension Mother     Social History Social History  Substance Use Topics  . Smoking status: Never Smoker  . Smokeless tobacco: Never Used  . Alcohol use No    Allergies  Allergen Reactions  . Penicillins Hives and Rash    Foamed from mouth Has patient had a PCN reaction causing immediate rash, facial/tongue/throat swelling, SOB or lightheadedness with hypotension: Yes Has patient had a PCN reaction causing severe rash involving mucus membranes or skin necrosis: No Has patient had a PCN reaction that required hospitalization No Has patient had a PCN reaction occurring within the last 10 years: No If all of the above answers are "NO", then may proceed with Cephalosporin use.     Current Outpatient Prescriptions  Medication Sig Dispense Refill  . coconut oil OIL Apply 1 application topically as needed. (Patient not taking: Reported on 04/10/2016) 500 mL PRN  . norethindrone-ethinyl estradiol 1/35 (ORTHO-NOVUM 1/35, 28,) tablet Take 1 tablet by mouth daily. 1 Package 11  . Prenat-FePoly-Metf-FA-DHA-DSS (VITAFOL FE+) 90-1-200 & 50 MG CPPK Take 2 tablets by mouth daily. (Patient not taking: Reported on 12/26/2015) 60 each 12  . valACYclovir (VALTREX) 500 MG tablet Take for 3 days prn each outbreak. 30 tablet prn   No current facility-administered medications for this visit.     Review of Systems Review of Systems Constitutional: negative for fatigue and weight loss Respiratory: negative for cough and  wheezing Cardiovascular: negative for chest pain, fatigue and palpitations Gastrointestinal: negative for abdominal pain and change in bowel habits Genitourinary:positive for episode of prolonged heavy vaginal bleeding on Depo Integument/breast: negative for nipple discharge Musculoskeletal:negative for myalgias Neurological: negative for gait problems and tremors Behavioral/Psych: negative for abusive relationship, depression Endocrine: negative for temperature intolerance      Blood pressure 122/77, pulse 71, height 5' 7.5" (1.715 m), weight 135 lb (61.2 kg), unknown if currently breastfeeding.  Physical Exam Physical Exam General:   alert  Skin:   no rash or abnormalities  Lungs:   clear to auscultation bilaterally  Heart:   regular rate and rhythm, S1, S2 normal, no murmur, click, rub or gallop  Breasts:   normal without suspicious masses, skin or nipple changes or axillary nodes  Abdomen:  normal findings: no organomegaly, soft, non-tender and no hernia  Pelvis:  External genitalia: normal general appearance Urinary system: urethral meatus normal and bladder without fullness, nontender Vaginal: normal without tenderness, induration or masses Cervix: normal appearance Adnexa: normal bimanual exam Uterus: anteverted and non-tender, normal size    50% of 15 min visit spent on counseling and coordination of care.    Data Reviewed Labs  Assessment     AUB - Hormonal Imbalance Contraceptive Counseling and Advice    Plan    D/C Depo Provera Start Ortho Novum 1/35 F/U prn   Orders Placed This Encounter  Procedures  . Hepatitis B surface antigen  . Hepatitis C antibody  . HIV antibody  . RPR  Meds ordered this encounter  Medications  . valACYclovir (VALTREX) 500 MG tablet    Sig: Take for 3 days prn each outbreak.    Dispense:  30 tablet    Refill:  prn  . norethindrone-ethinyl estradiol 1/35 (ORTHO-NOVUM 1/35, 28,) tablet    Sig: Take 1 tablet by mouth daily.     Dispense:  1 Package    Refill:  11

## 2016-04-11 LAB — CERVICOVAGINAL ANCILLARY ONLY
Bacterial vaginitis: NEGATIVE
Candida vaginitis: NEGATIVE
Chlamydia: NEGATIVE
Neisseria Gonorrhea: NEGATIVE
Trichomonas: NEGATIVE

## 2016-04-11 LAB — RPR: RPR: NONREACTIVE

## 2016-04-11 LAB — HEPATITIS B SURFACE ANTIGEN: HEP B S AG: NEGATIVE

## 2016-04-11 LAB — HIV ANTIBODY (ROUTINE TESTING W REFLEX): HIV SCREEN 4TH GENERATION: NONREACTIVE

## 2016-04-11 LAB — HEPATITIS C ANTIBODY

## 2017-05-26 ENCOUNTER — Ambulatory Visit (INDEPENDENT_AMBULATORY_CARE_PROVIDER_SITE_OTHER): Payer: Medicaid Other | Admitting: Certified Nurse Midwife

## 2017-05-26 ENCOUNTER — Encounter: Payer: Self-pay | Admitting: Certified Nurse Midwife

## 2017-05-26 ENCOUNTER — Other Ambulatory Visit (HOSPITAL_COMMUNITY)
Admission: RE | Admit: 2017-05-26 | Discharge: 2017-05-26 | Disposition: A | Discharge status: Home / Self Care | Payer: Medicaid Other | Source: Ambulatory Visit | Attending: Certified Nurse Midwife | Admitting: Certified Nurse Midwife

## 2017-05-26 VITALS — BP 110/81 | HR 84 | Ht 68.0 in | Wt 129.2 lb

## 2017-05-26 DIAGNOSIS — A6004 Herpesviral vulvovaginitis: Secondary | ICD-10-CM | POA: Diagnosis not present

## 2017-05-26 DIAGNOSIS — N898 Other specified noninflammatory disorders of vagina: Secondary | ICD-10-CM | POA: Diagnosis not present

## 2017-05-26 DIAGNOSIS — Z0001 Encounter for general adult medical examination with abnormal findings: Secondary | ICD-10-CM

## 2017-05-26 DIAGNOSIS — Z01419 Encounter for gynecological examination (general) (routine) without abnormal findings: Secondary | ICD-10-CM

## 2017-05-26 MED ORDER — VALACYCLOVIR HCL 500 MG PO TABS: ORAL_TABLET | ORAL | 99 refills | Status: DC

## 2017-05-26 NOTE — Progress Notes (Signed)
Patient is in the office for annual, last pap 12-26-15. Pt declined std testing and birth control.

## 2017-05-26 NOTE — Progress Notes (Signed)
Subjective:        Ashley [name] is a 22 y.o. female here for a routine exam.  Current complaints: none, reports normal periods. Declines STD screening.  Is in school full time.  Son will be 2 in August.       Personal health questionnaire:  Is patient [name], have a family history of breast and/or ovarian cancer: no Is there a family history of uterine cancer diagnosed at age < 3650, gastrointestinal cancer, urinary tract cancer, family member who is a Personnel officerLynch syndrome-associated carrier: no Is the patient overweight and hypertensive, family history of diabetes, personal history of gestational diabetes, preeclampsia or PCOS: no Is patient over 2155, have PCOS,  family history of premature CHD under age 24, diabetes, smoke, have hypertension or peripheral artery disease:  no At any time, has a partner hit, kicked or otherwise hurt or frightened you?: no Over the past 2 weeks, have you felt down, depressed or hopeless?: no Over the past 2 weeks, have you felt little interest or pleasure in doing things?:not asked   Gynecologic History Patient's last menstrual period was 05/17/2017. Contraception: abstinence Last Pap: 12/26/15. Results were: normal Last mammogram: n/a <40 years, no significant family hx.   Obstetric History OB History  Gravida Para Term Preterm AB Living  1 1 1     1   SAB TAB Ectopic Multiple Live Births          1    # Outcome Date GA Lbr Len/2nd Weight Sex Delivery Anes PTL Lv  1 Term 09/20/15 3515w1d  5 lb 13.1 oz (2.639 kg) M Vag-Spont EPI  LIV    Past Medical History:  Diagnosis Date  . Medical history non-contributory     Past Surgical History:  Procedure Laterality Date  . NO PAST SURGERIES       Current Outpatient Medications:  .  coconut oil OIL, Apply 1 application topically as needed. (Patient not taking: Reported on 04/10/2016), Disp: 500 mL, Rfl: PRN .  norethindrone-ethinyl estradiol 1/35 (ORTHO-NOVUM 1/35, 28,) tablet, Take 1 tablet by  mouth daily. (Patient not taking: Reported on 05/26/2017), Disp: 1 Package, Rfl: 11 .  Prenat-FePoly-Metf-FA-DHA-DSS (VITAFOL FE+) 90-1-200 & 50 MG CPPK, Take 2 tablets by mouth daily. (Patient not taking: Reported on 12/26/2015), Disp: 60 each, Rfl: 12 .  valACYclovir (VALTREX) 500 MG tablet, Take for 3 days prn each outbreak., Disp: 30 tablet, Rfl: prn Allergies  Allergen Reactions  . Penicillins Hives and Rash    Foamed from mouth Has patient had a PCN reaction causing immediate rash, facial/tongue/throat swelling, SOB or lightheadedness with hypotension: Yes Has patient had a PCN reaction causing severe rash involving mucus membranes or skin necrosis: No Has patient had a PCN reaction that required hospitalization No Has patient had a PCN reaction occurring within the last 10 years: No If all of the above answers are "NO", then may proceed with Cephalosporin use.     Social History   Tobacco Use  . Smoking status: Never Smoker  . Smokeless tobacco: Never Used  Substance Use Topics  . Alcohol use: No    Family History  Problem Relation Age of Onset  . Diabetes Mother   . Hypertension Mother       Review of Systems  Constitutional: negative for fatigue and weight loss Respiratory: negative for cough and wheezing Cardiovascular: negative for chest pain, fatigue and palpitations Gastrointestinal: negative for abdominal pain and change in bowel habits Musculoskeletal:negative for myalgias Neurological: negative for  gait problems and tremors Behavioral/Psych: negative for abusive relationship, depression Endocrine: negative for temperature intolerance    Genitourinary:negative for abnormal menstrual periods, genital lesions, hot flashes, sexual problems and vaginal discharge Integument/breast: negative for breast lump, breast tenderness, nipple discharge and skin lesion(s)    Objective:       BP 110/81   Pulse 84   Ht 5\' 8"  (1.727 m)   Wt 129 lb 3.2 oz (58.6 kg)   LMP  05/17/2017   Breastfeeding? No   BMI 19.64 kg/m  General:   alert  Skin:   no rash or abnormalities  Lungs:   clear to auscultation bilaterally  Heart:   regular rate and rhythm, S1, S2 normal, no murmur, click, rub or gallop  Breasts:   normal without suspicious masses, skin or nipple changes or axillary nodes  Abdomen:  normal findings: no organomegaly, soft, non-tender and no hernia  Pelvis:  External genitalia: normal general appearance Urinary system: urethral meatus normal and bladder without fullness, nontender Vaginal: normal without tenderness, induration or masses Cervix: normal appearance Adnexa: normal bimanual exam Uterus: anteverted and non-tender, normal size   Lab Review Urine pregnancy test Labs reviewed yes Radiologic studies reviewed no  50% of 30 min visit spent on counseling and coordination of care.   Assessment & Plan    Healthy female exam.    1. Well woman exam    - Cytology - PAP  2. Herpes simplex vulvovaginitis    - valACYclovir (VALTREX) 500 MG tablet; Take for 3 days prn each outbreak.  Dispense: 30 tablet; Refill: prn  3. Vaginal discharge   - Cervicovaginal ancillary only   Education reviewed: calcium supplements, depression evaluation, low fat, low cholesterol diet, safe sex/STD prevention, self breast exams, skin cancer screening and weight bearing exercise. Contraception: abstinence. Follow up in: 1 year.   Meds ordered this encounter  Medications  . valACYclovir (VALTREX) 500 MG tablet    Sig: Take for 3 days prn each outbreak.    Dispense:  30 tablet    Refill:  prn     Possible management options include:contraception when sexually active.  Follow up as needed.

## 2017-05-27 LAB — CERVICOVAGINAL ANCILLARY ONLY
Bacterial vaginitis: NEGATIVE
Candida vaginitis: NEGATIVE

## 2017-05-28 ENCOUNTER — Other Ambulatory Visit: Payer: Self-pay | Admitting: Certified Nurse Midwife

## 2017-05-28 LAB — CYTOLOGY - PAP: Diagnosis: NEGATIVE

## 2017-06-20 ENCOUNTER — Ambulatory Visit (INDEPENDENT_AMBULATORY_CARE_PROVIDER_SITE_OTHER): Payer: Medicaid Other

## 2017-06-20 DIAGNOSIS — Z3201 Encounter for pregnancy test, result positive: Secondary | ICD-10-CM

## 2017-06-20 DIAGNOSIS — Z3200 Encounter for pregnancy test, result unknown: Secondary | ICD-10-CM

## 2017-06-20 LAB — POCT URINE PREGNANCY: Preg Test, Ur: POSITIVE — AB

## 2017-06-20 NOTE — Progress Notes (Signed)
Chart reviewed for nurse visit. Agree with plan of care.   Rolm Bookbinder, [place] 06/20/2017 9:14 AM

## 2017-06-20 NOTE — Progress Notes (Signed)
..   Ms. Moffit presents today for UPT. She has no unusual complaints. LMP: 05-17-17    OBJECTIVE: Appears well, in no apparent distress.  OB History    Gravida  1   Para  1   Term  1   Preterm      AB      Living  1     SAB      TAB      Ectopic      Multiple      Live Births  1          Home UPT Result:Positive In-Office UPT result:Positive I have reviewed the patient's medical, obstetrical, social, and family histories, and medications.   ASSESSMENT: Positive pregnancy test  PLAN Prenatal care to be completed at: [facility]

## 2017-08-05 ENCOUNTER — Encounter: Payer: Self-pay | Admitting: Certified Nurse Midwife

## 2017-08-05 ENCOUNTER — Other Ambulatory Visit (HOSPITAL_COMMUNITY)
Admission: RE | Admit: 2017-08-05 | Discharge: 2017-08-05 | Disposition: A | Discharge status: Home / Self Care | Payer: Medicaid Other | Source: Ambulatory Visit | Attending: Certified Nurse Midwife | Admitting: Certified Nurse Midwife

## 2017-08-05 ENCOUNTER — Ambulatory Visit (INDEPENDENT_AMBULATORY_CARE_PROVIDER_SITE_OTHER): Payer: Medicaid Other | Admitting: Certified Nurse Midwife

## 2017-08-05 VITALS — BP 121/79 | HR 72 | Wt 127.0 lb

## 2017-08-05 DIAGNOSIS — N898 Other specified noninflammatory disorders of vagina: Secondary | ICD-10-CM | POA: Insufficient documentation

## 2017-08-05 DIAGNOSIS — Z3481 Encounter for supervision of other normal pregnancy, first trimester: Secondary | ICD-10-CM

## 2017-08-05 DIAGNOSIS — Z3490 Encounter for supervision of normal pregnancy, unspecified, unspecified trimester: Secondary | ICD-10-CM

## 2017-08-05 NOTE — Progress Notes (Signed)
Subjective:   Ashley [name] is a 22 y.o. G2P1001 at 411w3d by LMP being seen today for her first obstetrical visit.  Her obstetrical history is significant for none. Patient does intend to breast feed. Pregnancy history fully reviewed.  Patient reports nausea, no bleeding, no contractions, no cramping, no leaking and constipation: OTC colace and miralax discussed.  HISTORY: OB History  Gravida Para Term Preterm AB Living  2 1 1  0 0 1  SAB TAB Ectopic Multiple Live Births  0 0 0 0 1    # Outcome Date GA Lbr Len/2nd Weight Sex Delivery Anes PTL Lv  2 Current           1 Term 09/20/15 895w1d  5 lb 13.1 oz (2.639 kg) M Vag-Spont EPI  LIV     Name: Adron BeneLSTON,JEREMIAH LA'MAR     Apgar1: 9      Last pap smear was done 05/26/17 and was normal  Past Medical History:  Diagnosis Date  . Medical history non-contributory    Past Surgical History:  Procedure Laterality Date  . NO PAST SURGERIES     Family History  Problem Relation Age of Onset  . Diabetes Mother   . Hypertension Mother    Social History   Tobacco Use  . Smoking status: Never Smoker  . Smokeless tobacco: Never Used  Substance Use Topics  . Alcohol use: No  . Drug use: No   Allergies  Allergen Reactions  . Penicillins Hives and Rash    Foamed from mouth Has patient had a PCN reaction causing immediate rash, facial/tongue/throat swelling, SOB or lightheadedness with hypotension: Yes Has patient had a PCN reaction causing severe rash involving mucus membranes or skin necrosis: No Has patient had a PCN reaction that required hospitalization No Has patient had a PCN reaction occurring within the last 10 years: No If all of the above answers are "NO", then may proceed with Cephalosporin use.    Current Outpatient Medications on File Prior to Visit  Medication Sig Dispense Refill  . Prenat-FePoly-Metf-FA-DHA-DSS (VITAFOL FE+) 90-1-200 & 50 MG CPPK Take 2 tablets by mouth daily. 60 each 12  . coconut oil OIL Apply  1 application topically as needed. (Patient not taking: Reported on 04/10/2016) 500 mL PRN  . norethindrone-ethinyl estradiol 1/35 (ORTHO-NOVUM 1/35, 28,) tablet Take 1 tablet by mouth daily. (Patient not taking: Reported on 05/26/2017) 1 Package 11  . valACYclovir (VALTREX) 500 MG tablet Take for 3 days prn each outbreak. (Patient not taking: Reported on 08/05/2017) 30 tablet prn   No current facility-administered medications on file prior to visit.     Review of Systems Pertinent items noted in HPI and remainder of comprehensive ROS otherwise negative.  Exam   Vitals:   08/05/17 0921  BP: 121/79  Pulse: 72  Weight: 127 lb (57.6 kg)   Fetal Heart Rate (bpm): 171; doppler  Uterus:     Pelvic Exam: Perineum: no hemorrhoids, normal perineum   Vulva: normal external genitalia, no lesions   Vagina:  normal mucosa, normal discharge   Cervix: no lesions and normal, pap smear done.    Adnexa: normal adnexa and no mass, fullness, tenderness   Bony Pelvis: average  System: General: well-developed, well-nourished female in no acute distress   Breast:  normal appearance, no masses or tenderness   Skin: normal coloration and turgor, no rashes   Neurologic: oriented, normal, negative, normal mood   Extremities: normal strength, tone, and muscle mass, ROM of  all joints is normal   HEENT PERRLA, extraocular movement intact and sclera clear, anicteric   Mouth/Teeth mucous membranes moist, pharynx normal without lesions and dental hygiene good   Neck supple and no masses   Cardiovascular: regular rate and rhythm   Respiratory:  no respiratory distress, normal breath sounds   Abdomen: soft, non-tender; bowel sounds normal; no masses,  no organomegaly     Assessment:   Pregnancy: G2P1001 Patient Active Problem List   Diagnosis Date Noted  . Supervision of normal pregnancy, antepartum 03/21/2015  . Concussion without loss of consciousness 12/02/2014     Plan:  1. Encounter for supervision of  normal pregnancy, antepartum, unspecified gravidity     Doing well.   - Obstetric Panel, Including HIV - Culture, OB Urine - Genetic Screening - Hemoglobin A1c - Vitamin D (25 hydroxy) - Inheritest Core(CF97,SMA,FraX)  2. Vaginal discharge       - Cervicovaginal ancillary only   Initial labs drawn. Continue prenatal vitamins. Genetic Screening discussed, NIPS: ordered. Ultrasound discussed; fetal anatomic survey: ordered. Problem list reviewed and updated. The nature of [facility] with multiple MDs and other Advanced Practice Providers was explained to patient; also emphasized that residents, students are part of our team. Routine obstetric precautions reviewed. Return in about 1 month (around 09/02/2017) for ROB.     Orvilla Cornwall, CNM [facility], [facility]

## 2017-08-05 NOTE — Progress Notes (Signed)
Last pap: 05/26/17 WNL   Pt needs Rx for constipation pt states this has been an issue for a while now.   Genetic Testing : Desires

## 2017-08-06 LAB — OBSTETRIC PANEL, INCLUDING HIV
Antibody Screen: NEGATIVE
BASOS ABS: 0 10*3/uL (ref 0.0–0.2)
Basos: 0 %
EOS (ABSOLUTE): 0.1 10*3/uL (ref 0.0–0.4)
Eos: 1 %
HEP B S AG: NEGATIVE
HIV SCREEN 4TH GENERATION: NONREACTIVE
Hematocrit: 40 % (ref 34.0–46.6)
Hemoglobin: 12.5 g/dL (ref 11.1–15.9)
IMMATURE GRANULOCYTES: 0 %
Immature Grans (Abs): 0 10*3/uL (ref 0.0–0.1)
LYMPHS: 28 %
Lymphocytes Absolute: 1.9 10*3/uL (ref 0.7–3.1)
MCH: 29.8 pg (ref 26.6–33.0)
MCHC: 31.3 g/dL — ABNORMAL LOW (ref 31.5–35.7)
MCV: 95 fL (ref 79–97)
MONOCYTES: 7 %
Monocytes Absolute: 0.5 10*3/uL (ref 0.1–0.9)
NEUTROS ABS: 4.4 10*3/uL (ref 1.4–7.0)
NEUTROS PCT: 64 %
PLATELETS: 286 10*3/uL (ref 150–450)
RBC: 4.2 x10E6/uL (ref 3.77–5.28)
RDW: 14.7 % (ref 12.3–15.4)
RPR: NONREACTIVE
RUBELLA: 6.33 {index} (ref >0.99)
Rh Factor: POSITIVE
WBC: 6.8 10*3/uL (ref 3.4–10.8)

## 2017-08-06 LAB — VITAMIN D 25 HYDROXY (VIT D DEFICIENCY, FRACTURES): VIT D 25 HYDROXY: 13.9 ng/mL — AB (ref 30.0–100.0)

## 2017-08-06 LAB — CERVICOVAGINAL ANCILLARY ONLY
Bacterial vaginitis: NEGATIVE
Candida vaginitis: NEGATIVE
Chlamydia: NEGATIVE
Neisseria Gonorrhea: NEGATIVE
Trichomonas: NEGATIVE

## 2017-08-06 LAB — HEMOGLOBIN A1C
ESTIMATED AVERAGE GLUCOSE: 100 mg/dL
HEMOGLOBIN A1C: 5.1 % (ref 4.8–5.6)

## 2017-08-08 LAB — URINE CULTURE, OB REFLEX

## 2017-08-08 LAB — CULTURE, OB URINE

## 2017-08-13 ENCOUNTER — Other Ambulatory Visit: Payer: Self-pay | Admitting: Certified Nurse Midwife

## 2017-08-13 ENCOUNTER — Encounter: Payer: Self-pay | Admitting: Certified Nurse Midwife

## 2017-08-13 DIAGNOSIS — N3001 Acute cystitis with hematuria: Secondary | ICD-10-CM

## 2017-08-13 DIAGNOSIS — Z3480 Encounter for supervision of other normal pregnancy, unspecified trimester: Secondary | ICD-10-CM

## 2017-08-13 DIAGNOSIS — E559 Vitamin D deficiency, unspecified: Secondary | ICD-10-CM

## 2017-08-13 MED ORDER — SULFAMETHOXAZOLE-TRIMETHOPRIM 400-80 MG PO TABS: 1.0000 | ORAL_TABLET | Freq: Two times a day (BID) | ORAL | 0 refills | Status: DC

## 2017-08-13 MED ORDER — VITAMIN D (ERGOCALCIFEROL) 1.25 MG (50000 UNIT) PO CAPS: 50000.0000 [IU] | ORAL_CAPSULE | ORAL | 2 refills | Status: DC

## 2017-08-13 MED ORDER — PHENAZOPYRIDINE HCL 200 MG PO TABS: 200.0000 mg | ORAL_TABLET | Freq: Three times a day (TID) | ORAL | 0 refills | Status: DC | PRN

## 2017-08-14 ENCOUNTER — Telehealth: Payer: Self-pay

## 2017-08-14 NOTE — Telephone Encounter (Signed)
Patient was notified, she verbalized understanding.

## 2017-08-14 NOTE — Telephone Encounter (Signed)
-----   Message from Roe Coombsachelle A Denney, CNM sent at 08/13/2017  9:32 PM EDT ----- Please let her know that she has a UTI and Bactrim has been sent in for her to take twice daily.  Please also tell her that if she has any fever, increased back pain with N&V or decreased urine output to let us know. Thank you.   Please let her know that her vitamin D level is low.  Vitamin D weekly tablet has been sent to her pharmacy for her to take.  Thank you.  R.Denney CNM

## 2017-08-16 LAB — INHERITEST CORE(CF97,SMA,FRAX)

## 2017-09-02 ENCOUNTER — Other Ambulatory Visit: Payer: Self-pay

## 2017-09-02 ENCOUNTER — Encounter: Payer: Self-pay | Admitting: Nurse Practitioner

## 2017-09-02 ENCOUNTER — Ambulatory Visit (INDEPENDENT_AMBULATORY_CARE_PROVIDER_SITE_OTHER): Payer: Medicaid Other | Admitting: Nurse Practitioner

## 2017-09-02 DIAGNOSIS — Z3480 Encounter for supervision of other normal pregnancy, unspecified trimester: Secondary | ICD-10-CM

## 2017-09-02 DIAGNOSIS — O2341 Unspecified infection of urinary tract in pregnancy, first trimester: Secondary | ICD-10-CM | POA: Insufficient documentation

## 2017-09-02 DIAGNOSIS — B009 Herpesviral infection, unspecified: Secondary | ICD-10-CM | POA: Insufficient documentation

## 2017-09-02 MED ORDER — VITAFOL GUMMIES 3.33-0.333-34.8 MG PO CHEW: 3.0000 | CHEWABLE_TABLET | Freq: Every day | ORAL | 11 refills | Status: DC

## 2017-09-02 NOTE — Progress Notes (Signed)
ROB, reports no complaints today. 

## 2017-09-02 NOTE — Patient Instructions (Signed)

## 2017-09-02 NOTE — Progress Notes (Signed)
    Subjective:  Ashley [name] is a 22 y.o. G2P1001 at 7040w3d being seen today for ongoing prenatal care.  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  Patient reports no complaints.  Contractions: Not present. Vag. Bleeding: None.  Movement: Absent. Denies leaking of fluid.   The following portions of the patient's history were reviewed and updated as appropriate: allergies, current medications, past family history, past medical history, past social history, past surgical history and problem list. Problem list updated.  Objective:   Vitals:   09/02/17 0951  BP: 116/71  Pulse: 67  Weight: 123 lb 9.6 oz (56.1 kg)    Fetal Status: Fetal Heart Rate (bpm): 152 Fundal Height: 15 cm Movement: Absent     General:  Alert, oriented and cooperative. Patient is in no acute distress.  Skin: Skin is warm and dry. No rash noted.   Cardiovascular: Normal heart rate noted  Respiratory: Normal respiratory effort, no problems with respiration noted  Abdomen: Soft, gravid, appropriate for gestational age. Pain/Pressure: Absent     Pelvic:  Cervical exam deferred        Extremities: Normal range of motion.  Edema: None  Mental Status: Normal mood and affect. Normal behavior. Normal judgment and thought content.   Urinalysis:      Assessment and Plan:  Pregnancy: G2P1001 at 4540w3d  1. Supervision of other normal pregnancy, antepartum Given info for childbirth classes as client wants a "natural birth"  - AFP, Serum, Open Spina Bifida - US MFM OB COMP + 14 WK; Future  Preterm labor symptoms and general obstetric precautions including but not limited to vaginal bleeding, contractions, leaking of fluid and fetal movement were reviewed in detail with the patient. Please refer to After Visit Summary for other  counseling recommendations.  Return in about 1 month (around 09/30/2017).  Nolene BernheimERRI BURLESON, RN, MSN, NP-BC Nurse Practitioner, [facility], [facility] 09/02/2017 10:24 AM

## 2017-09-05 LAB — AFP, SERUM, OPEN SPINA BIFIDA
AFP MOM: 1.2
AFP Value: 44.4 ng/mL
Gest. Age on Collection Date: 15.3 weeks
MATERNAL AGE AT EDD: 24 a
OSBR Risk 1 IN: 10000
TEST RESULTS AFP: NEGATIVE
Weight: 123 [lb_av]

## 2017-09-11 ENCOUNTER — Encounter: Payer: Self-pay | Admitting: Obstetrics

## 2017-09-29 ENCOUNTER — Encounter: Payer: Self-pay | Admitting: Obstetrics and Gynecology

## 2017-09-29 ENCOUNTER — Ambulatory Visit (HOSPITAL_COMMUNITY)
Admission: RE | Admit: 2017-09-29 | Discharge: 2017-09-29 | Disposition: A | Discharge status: Home / Self Care | Payer: Medicaid Other | Source: Ambulatory Visit | Attending: Nurse Practitioner | Admitting: Nurse Practitioner

## 2017-09-29 ENCOUNTER — Other Ambulatory Visit: Payer: Self-pay

## 2017-09-29 ENCOUNTER — Other Ambulatory Visit: Payer: Self-pay | Admitting: Nurse Practitioner

## 2017-09-29 ENCOUNTER — Ambulatory Visit (INDEPENDENT_AMBULATORY_CARE_PROVIDER_SITE_OTHER): Payer: Medicaid Other | Admitting: Obstetrics and Gynecology

## 2017-09-29 VITALS — BP 115/73 | HR 70 | Wt 126.9 lb

## 2017-09-29 DIAGNOSIS — Z363 Encounter for antenatal screening for malformations: Secondary | ICD-10-CM

## 2017-09-29 DIAGNOSIS — Z3482 Encounter for supervision of other normal pregnancy, second trimester: Secondary | ICD-10-CM

## 2017-09-29 DIAGNOSIS — Z3A19 19 weeks gestation of pregnancy: Secondary | ICD-10-CM | POA: Insufficient documentation

## 2017-09-29 DIAGNOSIS — Z3480 Encounter for supervision of other normal pregnancy, unspecified trimester: Secondary | ICD-10-CM

## 2017-09-29 DIAGNOSIS — N3000 Acute cystitis without hematuria: Secondary | ICD-10-CM

## 2017-09-29 DIAGNOSIS — B009 Herpesviral infection, unspecified: Secondary | ICD-10-CM

## 2017-09-29 NOTE — Progress Notes (Signed)
ROB.  Declined the FLU Vaccine.

## 2017-09-29 NOTE — Patient Instructions (Signed)
Contraception Choices Contraception, also called birth control, refers to methods or devices that prevent pregnancy. Hormonal methods Contraceptive implant A contraceptive implant is a thin, plastic tube that contains a hormone. It is inserted into the upper part of the arm. It can remain in place for up to 3 years. Progestin-only injections Progestin-only injections are injections of progestin, a synthetic form of the hormone progesterone. They are given every 3 months by a health care provider. Birth control pills Birth control pills are pills that contain hormones that prevent pregnancy. They must be taken once a day, preferably at the same time each day. Birth control patch The birth control patch contains hormones that prevent pregnancy. It is placed on the skin and must be changed once a week for three weeks and removed on the fourth week. A prescription is needed to use this method of contraception. Vaginal ring A vaginal ring contains hormones that prevent pregnancy. It is placed in the vagina for three weeks and removed on the fourth week. After that, the process is repeated with a new ring. A prescription is needed to use this method of contraception. Emergency contraceptive Emergency contraceptives prevent pregnancy after unprotected sex. They come in pill form and can be taken up to 5 days after sex. They work best the sooner they are taken after having sex. Most emergency contraceptives are available without a prescription. This method should not be used as your only form of birth control. Barrier methods Female condom A female condom is a thin sheath that is worn over the penis during sex. Condoms keep sperm from going inside a woman's body. They can be used with a spermicide to increase their effectiveness. They should be disposed after a single use. Female condom A female condom is a soft, loose-fitting sheath that is put into the vagina before sex. The condom keeps sperm from going  inside a woman's body. They should be disposed after a single use.  Intrauterine contraception Intrauterine device (IUD) An IUD is a T-shaped device that is put in a woman's uterus. There are two types:  Hormone IUD.This type contains progestin, a synthetic form of the hormone progesterone. This type can stay in place for 3-5 years.  Copper IUD.This type is wrapped in copper wire. It can stay in place for 10 years.  Permanent methods of contraception Female tubal ligation In this method, a woman's fallopian tubes are sealed, tied, or blocked during surgery to prevent eggs from traveling to the uterus.  Female sterilization This is a procedure to tie off the tubes that carry sperm (vasectomy). After the procedure, the man can still ejaculate fluid (semen).  Summary  Contraception, also called birth control, means methods or devices that prevent pregnancy.  Hormonal methods of contraception include implants, injections, pills, patches, vaginal rings, and emergency contraceptives.  Barrier methods of contraception can include female condoms, female condoms, diaphragms, cervical caps, sponges, and spermicides.  There are two types of IUDs (intrauterine devices). An IUD can be put in a woman's uterus to prevent pregnancy for 3-5 years.  Permanent sterilization can be done through a procedure for males, females, or both. This information is not intended to replace advice given to you by your health care provider. Make sure you discuss any questions you have with your health care provider. Document Released: 01/21/2005 Document Revised: 02/24/2016 Document Reviewed: 02/24/2016 Elsevier Interactive Patient Education  2018 Elsevier Inc.  

## 2017-09-29 NOTE — Progress Notes (Signed)
   PRENATAL VISIT NOTE  Subjective:  Ashley [name] is a 22 y.o. G2P1001 at 7378w2d being seen today for ongoing prenatal care.  She is currently monitored for the following issues for this low-risk pregnancy and has Supervision of normal pregnancy, antepartum; Concussion without loss of consciousness; Vitamin D deficiency; HSV-2 infection; and UTI in pregnancy, antepartum, first trimester on their problem list.  Patient reports occasional cramping and tightness, goes away on its own. Not drinking a lot of water.   Contractions: Not present. Vag. Bleeding: None.  Movement: Present. Denies leaking of fluid.   The following portions of the patient's history were reviewed and updated as appropriate: allergies, current medications, past family history, past medical history, past social history, past surgical history and problem list. Problem list updated.  Objective:   Vitals:   09/29/17 1536  BP: 115/73  Pulse: 70  Weight: 126 lb 14.4 oz (57.6 kg)    Fetal Status: Fetal Heart Rate (bpm): 154   Movement: Present     General:  Alert, oriented and cooperative. Patient is in no acute distress.  Skin: Skin is warm and dry. No rash noted.   Cardiovascular: Normal heart rate noted  Respiratory: Normal respiratory effort, no problems with respiration noted  Abdomen: Soft, gravid, appropriate for gestational age.  Pain/Pressure: Absent     Pelvic: Cervical exam deferred        Extremities: Normal range of motion.  Edema: None  Mental Status: Normal mood and affect. Normal behavior. Normal judgment and thought content.   Assessment and Plan:  Pregnancy: G2P1001 at 4478w2d  1. Supervision of other normal pregnancy, antepartum Encouraged increased PO hydration to improve cramping Reviewed options for contraception, she would like depo  2. HSV-2 infection ppx 34 weeks  3. Acute cystitis without hematuria No symptoms - Urine Culture   Preterm labor symptoms and general obstetric precautions  including but not limited to vaginal bleeding, contractions, leaking of fluid and fetal movement were reviewed in detail with the patient. Please refer to After Visit Summary for other counseling recommendations.  Return in about 4 weeks (around 10/27/2017) for OB visit.  No future appointments.  Conan BowensKelly M Nayah Lukens, MD

## 2017-10-01 LAB — URINE CULTURE: ORGANISM ID, BACTERIA: NO GROWTH

## 2017-10-08 ENCOUNTER — Telehealth: Payer: Self-pay | Admitting: General Practice

## 2017-10-08 NOTE — Telephone Encounter (Signed)
Patient called into office and left a message stating she needs to speak with someone. Patient states she is [redacted] weeks pregnant and has been feeling depressed and needs medication. Per chart review, patient goes to [place] office. [facility].

## 2017-10-09 ENCOUNTER — Telehealth: Payer: Self-pay | Admitting: Clinical

## 2017-10-09 NOTE — Telephone Encounter (Signed)
Returned pt call concerning increase in symptoms of depression, feeling tired, depressed, taking unisom to sleep, but waking up crying. Pt agrees to call [facility] tomorrow morning to get an earlier appointment asap to see a medical provider to discuss medication, and will see [facility] afterwards at [facility].   Pt agrees to phone follow-up by [facility] on 10-10-17.

## 2017-10-10 ENCOUNTER — Telehealth: Payer: Self-pay | Admitting: Clinical

## 2017-10-10 NOTE — Telephone Encounter (Signed)
Follow up call to confirm pt appointments at [facility] on Monday, followed by appointment at [facility]. Pt is aware she will not be considered late if she does not arrive "on time" at [facility], as we know she will be arriving after her [facility] appointment.

## 2017-10-11 ENCOUNTER — Other Ambulatory Visit: Payer: Self-pay | Admitting: Obstetrics

## 2017-10-11 DIAGNOSIS — F32A Depression, unspecified: Secondary | ICD-10-CM

## 2017-10-11 DIAGNOSIS — F329 Major depressive disorder, single episode, unspecified: Secondary | ICD-10-CM

## 2017-10-11 DIAGNOSIS — O99340 Other mental disorders complicating pregnancy, unspecified trimester: Principal | ICD-10-CM

## 2017-10-11 MED ORDER — SERTRALINE HCL 50 MG PO TABS: 50.0000 mg | ORAL_TABLET | Freq: Every day | ORAL | 5 refills | Status: DC

## 2017-10-13 ENCOUNTER — Ambulatory Visit (INDEPENDENT_AMBULATORY_CARE_PROVIDER_SITE_OTHER): Payer: Medicaid Other | Admitting: Obstetrics

## 2017-10-13 ENCOUNTER — Ambulatory Visit (INDEPENDENT_AMBULATORY_CARE_PROVIDER_SITE_OTHER): Payer: Medicaid Other | Admitting: Clinical

## 2017-10-13 ENCOUNTER — Encounter: Payer: Self-pay | Admitting: Obstetrics

## 2017-10-13 VITALS — BP 116/66 | HR 78 | Wt 129.5 lb

## 2017-10-13 DIAGNOSIS — F32A Depression, unspecified: Secondary | ICD-10-CM

## 2017-10-13 DIAGNOSIS — F322 Major depressive disorder, single episode, severe without psychotic features: Secondary | ICD-10-CM | POA: Diagnosis not present

## 2017-10-13 DIAGNOSIS — F329 Major depressive disorder, single episode, unspecified: Secondary | ICD-10-CM

## 2017-10-13 DIAGNOSIS — D508 Other iron deficiency anemias: Secondary | ICD-10-CM

## 2017-10-13 DIAGNOSIS — Z3480 Encounter for supervision of other normal pregnancy, unspecified trimester: Secondary | ICD-10-CM

## 2017-10-13 DIAGNOSIS — O99342 Other mental disorders complicating pregnancy, second trimester: Secondary | ICD-10-CM

## 2017-10-13 DIAGNOSIS — Z3482 Encounter for supervision of other normal pregnancy, second trimester: Secondary | ICD-10-CM

## 2017-10-13 NOTE — Telephone Encounter (Signed)
Error

## 2017-10-13 NOTE — Progress Notes (Signed)
Subjective:  Ashley [name] is a 22 y.o. G2P1001 at [redacted]w[redacted]d being seen today for ongoing prenatal care.  She is currently monitored for the following issues for this low-risk pregnancy and has Supervision of normal pregnancy, antepartum; Concussion without loss of consciousness; Vitamin D deficiency; HSV-2 infection; and UTI in pregnancy, antepartum, first trimester on their problem list.  Patient reports DEPRESSION.  Contractions: Not present. Vag. Bleeding: None.  Movement: Present. Denies leaking of fluid.   The following portions of the patient's history were reviewed and updated as appropriate: allergies, current medications, past family history, past medical history, past social history, past surgical history and problem list. Problem list updated.  Objective:   Vitals:   10/13/17 1033  BP: 116/66  Pulse: 78  Weight: 129 lb 8 oz (58.7 kg)    Fetal Status:     Movement: Present       General:  Alert and no distress    Lungs:  Clear    Heart:  RRR Mental Status: Depressed mood and affect. Normal behavior. Normal judgment and thought content.   Urinalysis:      Assessment and Plan:  Pregnancy: G2P1001 at [redacted]w[redacted]d  1. Supervision of other normal pregnancy, antepartum  2. Depression during pregnancy in second trimester - stable  3. Iron deficiency anemia secondary to inadequate dietary iron intake Rx: - CBC  Preterm labor symptoms and general obstetric precautions including but not limited to vaginal bleeding, contractions, leaking of fluid and fetal :were reviewed in detail with the patient. Please refer to After Visit Summary for other counseling recommendations.  Return if symptoms worsen or fail to improve, for ROB.   Brock Bad, MD

## 2017-10-13 NOTE — BH Specialist Note (Signed)
Integrated Behavioral Health Initial Visit  MRN: [id number] Name: Ashley [name]  Number of Integrated Behavioral Health Clinician visits:: 1/6 Session Start time: 11:50  Session End time: 12:10 Total time: 1 hour  Type of Service: Integrated Behavioral Health- Individual/Family Interpretor:No. Interpretor Name and Language: n/a   Warm Hand Off Completed.       SUBJECTIVE: Ashley [name] is a 22 y.o. female accompanied by n/a Patient was referred by Coral Ceo for depression . Patient reports the following symptoms/concerns: Pt states she has been feeling an increase in depression during this pregnancy, along with SI; pt says she will begin taking Zoloft today, and will share her Safety plan with her boyfriend and best friend.  Duration of problem: Current pregnancy; Severity of problem: severe  OBJECTIVE: Mood: Depressed and Affect: Depressed Risk of harm to self or others: Suicidal ideation No plan to harm self or others Pt had thoughts and idea to harm herself, but the thought scared her, so she called and talked to her good friend about her feelings. Pt's friend and boyfriend have been supportive. Pt has no current intent to hurt herself, and no SI or HI today. Pt agrees with having a safety plan in place, if the thoughts return.   LIFE CONTEXT: Family and Social: Pt lives by herself; her best friend and boyfriend are her greatest supports.  School/Work: Pt works full time Self-Care: Pt recognizes a greater need for self-care Life Changes: Current pregnancy   GOALS ADDRESSED: Patient will: 1. Reduce symptoms of: anxiety, depression and stress 2. Increase knowledge and/or ability of: self-management skills  3. Demonstrate ability to: Increase healthy adjustment to current life circumstances  INTERVENTIONS: Interventions utilized: Supportive Counseling and Psychoeducation and/or Health Education and Safety Planning Standardized Assessments completed: GAD-7 and PHQ  9  ASSESSMENT: Patient currently experiencing Major depressive disorder, single episode, severe, no psychotic features   Patient may benefit from Initial OB introduction to integrated behavioral health services .  PLAN: 1. Follow up with behavioral health clinician on : Two days via phone 2. Behavioral recommendations:  -Follow Safety Plan -Establish with [facility] in [place]  -Read educational materials regarding coping with symptoms of depression and anxiety  3. Referral(s): Integrated Art gallery manager (In Clinic) and [facility] (LME/Outside Clinic) 4. "From scale of 1-10, how likely are you to follow plan?": 10  Rae Lips, LCSW  Depression screen [facility] 2/9 10/13/2017 10/13/2017 10/04/2015  Decreased Interest 2 2 1   Down, Depressed, Hopeless 3 3 1   PHQ - 2 Score 5 5 2   Altered sleeping 3 3 2   Tired, decreased energy 2 2 1   Change in appetite 2 2 3   Feeling bad or failure about yourself  3 3 0  Trouble concentrating 1 2 0  Moving slowly or fidgety/restless 1 2 0  Suicidal thoughts 2 1 0  PHQ-9 Score 19 20 8   Difficult doing work/chores - Somewhat difficult -   GAD 7 : Generalized Anxiety Score 10/13/2017  Nervous, Anxious, on Edge 1  Control/stop worrying 2  Worry too much - different things 3  Trouble relaxing 2  Restless 3  Easily annoyed or irritable 2  Afraid - awful might happen 2  Total GAD 7 Score 15

## 2017-10-13 NOTE — Progress Notes (Signed)
Presents for Depression Screening and Rx for Antidepressants.  PHQ-9=20 Zoloft was already sent to [facility].

## 2017-10-14 ENCOUNTER — Other Ambulatory Visit: Payer: Self-pay | Admitting: Obstetrics

## 2017-10-14 DIAGNOSIS — O99019 Anemia complicating pregnancy, unspecified trimester: Secondary | ICD-10-CM

## 2017-10-14 LAB — CBC
HEMATOCRIT: 27.7 % — AB (ref 34.0–46.6)
HEMOGLOBIN: 9.6 g/dL — AB (ref 11.1–15.9)
MCH: 31.5 pg (ref 26.6–33.0)
MCHC: 34.7 g/dL (ref 31.5–35.7)
MCV: 91 fL (ref 79–97)
Platelets: 207 10*3/uL (ref 150–450)
RBC: 3.05 x10E6/uL — AB (ref 3.77–5.28)
RDW: 13 % (ref 12.3–15.4)
WBC: 8 10*3/uL (ref 3.4–10.8)

## 2017-10-14 MED ORDER — FERROUS SULFATE 325 (65 FE) MG PO TABS: 325.0000 mg | ORAL_TABLET | Freq: Two times a day (BID) | ORAL | 5 refills | Status: DC

## 2017-10-16 ENCOUNTER — Telehealth: Payer: Self-pay | Admitting: Clinical

## 2017-10-16 NOTE — Telephone Encounter (Signed)
Attempt to follow up with patient; Left HIPPA-compliant message to call back Asher MuirJamie from [facility]  at [phone].

## 2017-10-27 ENCOUNTER — Ambulatory Visit (INDEPENDENT_AMBULATORY_CARE_PROVIDER_SITE_OTHER): Payer: Medicaid Other | Admitting: Clinical

## 2017-10-27 ENCOUNTER — Other Ambulatory Visit: Payer: Self-pay

## 2017-10-27 ENCOUNTER — Ambulatory Visit (INDEPENDENT_AMBULATORY_CARE_PROVIDER_SITE_OTHER): Payer: Medicaid Other

## 2017-10-27 VITALS — BP 110/65 | HR 92 | Wt 131.7 lb

## 2017-10-27 DIAGNOSIS — F32A Depression, unspecified: Secondary | ICD-10-CM

## 2017-10-27 DIAGNOSIS — F322 Major depressive disorder, single episode, severe without psychotic features: Secondary | ICD-10-CM | POA: Diagnosis not present

## 2017-10-27 DIAGNOSIS — F329 Major depressive disorder, single episode, unspecified: Secondary | ICD-10-CM

## 2017-10-27 DIAGNOSIS — Z3480 Encounter for supervision of other normal pregnancy, unspecified trimester: Secondary | ICD-10-CM

## 2017-10-27 DIAGNOSIS — O99340 Other mental disorders complicating pregnancy, unspecified trimester: Secondary | ICD-10-CM

## 2017-10-27 NOTE — BH Specialist Note (Signed)
Integrated Behavioral Health Follow Up Visit  MRN: [id number] Name: Ashley [name]  Number of Integrated Behavioral Health Clinician visits: 2/6 Session Start time: 2:50Session End time: 3:23 Total time: 35 minutes  Type of Service: Integrated Behavioral Health- Individual/Family Interpretor:No. Interpretor Name and Language: n/a  SUBJECTIVE: [name] is a 22 y.o. female accompanied by n/a Patient was referred by Coral Ceoharles Harper, MD for depression. Patient reports the following symptoms/concerns: Pt states she stopped taking Zoloft, as she had a panic attack within one hour of  the first dosage, but feels better, because now her mother knows she is depressed.Pt used to enjoy dancing daily, but has felt no energy to dance for hours as prior to depression;  is open to [facility] at [facility] [place].  Duration of problem: Current pregnancy; Severity of problem: moderately severe  OBJECTIVE: Mood: Normal and Affect: Appropriate Risk of harm to self or others: No plan to harm self or others  LIFE CONTEXT: Family and Social: Pt lives alone; best friend and boyfriend/FOB are her greatest supports School/Work: Full time work Engineer, petroleumelf-Care: Using daily mood apps; used to enjoy dancing daily Life Changes: Current pregnancy   GOALS ADDRESSED: Patient will: 1.  Reduce symptoms of: anxiety, depression and stress  2.  Increase knowledge and/or ability of: self-management skills  3.  Demonstrate ability to: Increase healthy adjustment to current life circumstances, Increase adequate support systems for patient/family and Increase motivation to adhere to plan of care  INTERVENTIONS: Interventions utilized:  Behavioral Activation Standardized Assessments completed: GAD-7 and PHQ 9   ASSESSMENT: Patient currently experiencing Major depressive disorder, current, severe, with no psychotic features.   Patient may benefit from continued brief therapeutic intervention regarding coping  with symptoms of depression and anxiety.  PLAN: 1. Follow up with behavioral health clinician on : Phone follow up in one week 2. Behavioral recommendations:  -Begin Worry Time strategy tomorrow, to prioritize life stress -Set timer on phone for daily 5 minute dancing, after Worry Time -Walk-in [facility] to establish care prior to next medical appointment [facility] -Continue taking prenatal vitamins, as recommended by medical provider -Continue using mood app daily 3. Referral(s): Integrated Art gallery managerBehavioral Health Services (In Clinic) and [facility] (LME/Outside Clinic) 4. "From scale of 1-10, how likely are you to follow plan?": 9  Rae LipsJamie C Vincient Vanaman, LCSW  [facility] 2/9 10/27/2017 10/13/2017 10/13/2017 10/04/2015  Decreased Interest 1 2 2 1   Down, Depressed, Hopeless 1 3 3 1   PHQ - 2 Score 2 5 5 2   Altered sleeping 2 3 3 2   Tired, decreased energy 2 2 2 1   Change in appetite 1 2 2 3   Feeling bad or failure about yourself  2 3 3  0  Trouble concentrating 2 1 2  0  Moving slowly or fidgety/restless 1 1 2  0  Suicidal thoughts 0 2 1 0  PHQ-9 Score 12 19 20 8   Difficult doing work/chores - - Somewhat difficult -   GAD 7 : Generalized Anxiety Score 10/27/2017 10/13/2017  Nervous, Anxious, on Edge 0 1  Control/stop worrying 1 2  Worry too much - different things 1 3  Trouble relaxing 1 2  Restless 1 3  Easily annoyed or irritable 2 2  Afraid - awful might happen 1 2  Total GAD 7 Score 7 15

## 2017-10-27 NOTE — Patient Instructions (Signed)

## 2017-10-27 NOTE — Progress Notes (Signed)
   PRENATAL VISIT NOTE  Subjective:  Ashley [name] is a 22 y.o. G2P1001 at 8561w2d being seen today for ongoing prenatal care.  She is currently monitored for the following issues for this low-risk pregnancy and has Supervision of normal pregnancy, antepartum; Concussion without loss of consciousness; Vitamin D deficiency; HSV-2 infection; and UTI in pregnancy, antepartum, first trimester on their problem list.  Patient reports no complaints.  Contractions: Not present. Vag. Bleeding: None.  Movement: Present. Denies leaking of fluid.   The following portions of the patient's history were reviewed and updated as appropriate: allergies, current medications, past family history, past medical history, past social history, past surgical history and problem list. Problem list updated.  Objective:   Vitals:   10/27/17 1352  BP: 110/65  Pulse: 92  Weight: 131 lb 11.2 oz (59.7 kg)    Fetal Status: Fetal Heart Rate (bpm): 154 Fundal Height: 23 cm Movement: Present     General:  Alert, oriented and cooperative. Patient is in no acute distress.  Skin: Skin is warm and dry. No rash noted.   Cardiovascular: Normal heart rate noted  Respiratory: Normal respiratory effort, no problems with respiration noted  Abdomen: Soft, gravid, appropriate for gestational age.  Pain/Pressure: Absent     Pelvic: Cervical exam deferred        Extremities: Normal range of motion.  Edema: None  Mental Status: Normal mood and affect. Normal behavior. Normal judgment and thought content.   Assessment and Plan:  Pregnancy: G2P1001 at 2861w2d  1. Supervision of other normal pregnancy, antepartum - No complaints. Routine care - Reviewed GTT and labs at next visit  2. Depression during pregnancy, antepartum - Patient to see Asher MuirJamie today  Preterm labor symptoms and general obstetric precautions including but not limited to vaginal bleeding, contractions, leaking of fluid and fetal movement were reviewed in detail with  the patient. Please refer to After Visit Summary for other counseling recommendations.  Return in about 4 weeks (around 11/24/2017) for Return OB visit, 2hr GTT and labs.  Future Appointments  Date Time Provider Department Center  10/27/2017  2:30 PM [facility]  11/24/2017  9:15 AM [facility] None  11/24/2017 10:15 AM [place]-Kirby, Wilmer FloorLisa A, CNM [facility] None    Rolm Bookbinderaroline M Chinita Schimpf, [place]  10/27/17 2:11 PM

## 2017-11-06 ENCOUNTER — Telehealth: Payer: Self-pay | Admitting: Clinical

## 2017-11-06 NOTE — Telephone Encounter (Signed)
Attempt to follow up for mood check; phone went silent, no voicemail; unable to leave message.

## 2017-11-24 ENCOUNTER — Other Ambulatory Visit: Payer: Medicaid Other

## 2017-11-24 ENCOUNTER — Ambulatory Visit (INDEPENDENT_AMBULATORY_CARE_PROVIDER_SITE_OTHER): Payer: Medicaid Other | Admitting: Advanced Practice Midwife

## 2017-11-24 VITALS — BP 115/76 | HR 76 | Wt 132.0 lb

## 2017-11-24 DIAGNOSIS — O99019 Anemia complicating pregnancy, unspecified trimester: Secondary | ICD-10-CM

## 2017-11-24 DIAGNOSIS — O99340 Other mental disorders complicating pregnancy, unspecified trimester: Secondary | ICD-10-CM

## 2017-11-24 DIAGNOSIS — F329 Major depressive disorder, single episode, unspecified: Secondary | ICD-10-CM

## 2017-11-24 DIAGNOSIS — Z3480 Encounter for supervision of other normal pregnancy, unspecified trimester: Secondary | ICD-10-CM

## 2017-11-24 NOTE — Progress Notes (Signed)
Pt presents for ROB.Pt has no concerns. 

## 2017-11-24 NOTE — Patient Instructions (Signed)
Perinatal Depression When a woman feels excessive sadness, anger, or anxiety during pregnancy or during the first 12 months after she gives birth, she has a condition called perinatal depression. Depression can interfere with work, school, relationships, and other everyday activities. If it is not managed properly, it can also cause problems in the mother and her baby. Sometimes, perinatal depression is left untreated because symptoms are thought to be normal mood swings during and right after pregnancy. If you have symptoms of depression, it is important to talk with your health care provider. What are the causes? The exact cause of this condition is not known. Hormonal changes during and after pregnancy may play a role in causing perinatal depression. What increases the risk? You are more likely to develop this condition if:  You have a personal or family history of depression, anxiety, or mood disorders.  You experience a stressful life event during pregnancy, such as the death of a loved one.  You have a lot of regular life stress.  You do not have support from family members or loved ones, or you are in an abusive relationship.  What are the signs or symptoms? Symptoms of this condition include:  Feeling sad or hopeless.  Feelings of guilt.  Feeling irritable or overwhelmed.  Changes in your appetite.  Lack of energy or motivation.  Sleep problems.  Difficulty concentrating or completing tasks.  Loss of interest in hobbies or relationships.  Headaches or stomach problems that do not go away.  How is this diagnosed? This condition is diagnosed based on a physical exam and mental evaluation. In some cases, your health care provider may use a depression screening tool. These tools include a list of questions that can help a health care provider diagnose depression. Your health care provider may refer you to a mental health expert who specializes in depression. How is this  treated? This condition may be treated with:  Medicines. Your health care provider will only give you medicines that have been proven safe for pregnancy and breastfeeding.  Talk therapy with a mental health professional to help change your patterns of thinking (cognitive behavioral therapy).  Support groups.  Brain stimulation or light therapies.  Stress reduction therapies, such as mindfulness.  Follow these instructions at home: Lifestyle  Do not use any products that contain nicotine or tobacco, such as cigarettes and e-cigarettes. If you need help quitting, ask your health care provider.  Do not use alcohol when you are pregnant. After your baby is born, limit alcohol intake to no more than 1 drink a day. One drink equals 12 oz of beer, 5 oz of wine, or 1 oz of hard liquor.  Consider joining a support group for new mothers. Ask your health care provider for recommendations.  Take good care of yourself. Make sure you: ? Get plenty of sleep. If you are having trouble sleeping, talk with your health care provider. ? Eat a healthy diet. This includes plenty of fruits and vegetables, whole grains, and lean proteins. ? Exercise regularly, as told by your health care provider. Ask your health care provider what exercises are safe for you. General instructions  Take over-the-counter and prescription medicines only as told by your health care provider.  Talk with your partner or family members about your feelings during pregnancy. Share any concerns or anxieties that you may have.  Ask for help with tasks or chores when you need it. Ask friends and family members to provide meals, watch your children,   or help with cleaning.  Keep all follow-up visits as told by your health care provider. This is important. Contact a health care provider if:  You (or people close to you) notice that you have any symptoms of depression.  You have depression and your symptoms get worse.  You  experience side effects from medicines, such as nausea or sleep problems. Get help right away if:  You feel like hurting yourself, your baby, or someone else. If you ever feel like you may hurt yourself or others, or have thoughts about taking your own life, get help right away. You can go to your nearest emergency department or call:  Your local emergency services (911 in the [place]  A suicide crisis helpline, such as the National Suicide Prevention Lifeline at (586) 180-0159. This is open 24 hours a day.  Summary  Perinatal depression is when a woman feels excessive sadness, anger, or anxiety during pregnancy or during the first 12 months after she gives birth.  If perinatal depression is not treated, it can lead to health problems for the mother and her baby.  This condition is treated with medicines, talk therapy, stress reduction therapies, or a combination of two or more treatments.  Talk with your partner or family members about your feelings. Do not be afraid to ask for help. This information is not intended to replace advice given to you by your health care provider. Make sure you discuss any questions you have with your health care provider. Document Released: 03/20/2016 Document Revised: 03/20/2016 Document Reviewed: 03/20/2016 Elsevier Interactive Patient Education  [employer].  Third Trimester of Pregnancy The third trimester is from week 28 through week 40 (months 7 through 9). The third trimester is a time when the unborn baby (fetus) is growing rapidly. At the end of the ninth month, the fetus is about 20 inches in length and weighs 6-10 pounds. Body changes during your third trimester Your body will continue to go through many changes during pregnancy. The changes vary from woman to woman. During the third trimester:  Your weight will continue to increase. You can expect to gain 25-35 pounds (11-16 kg) by the end of the pregnancy.  You may begin to get stretch  marks on your hips, abdomen, and breasts.  You may urinate more often because the fetus is moving lower into your pelvis and pressing on your bladder.  You may develop or continue to have heartburn. This is caused by increased hormones that slow down muscles in the digestive tract.  You may develop or continue to have constipation because increased hormones slow digestion and cause the muscles that push waste through your intestines to relax.  You may develop hemorrhoids. These are swollen veins (varicose veins) in the rectum that can itch or be painful.  You may develop swollen, bulging veins (varicose veins) in your legs.  You may have increased body aches in the pelvis, back, or thighs. This is due to weight gain and increased hormones that are relaxing your joints.  You may have changes in your hair. These can include thickening of your hair, rapid growth, and changes in texture. Some women also have hair loss during or after pregnancy, or hair that feels dry or thin. Your hair will most likely return to normal after your baby is born.  Your breasts will continue to grow and they will continue to become tender. A yellow fluid (colostrum) may leak from your breasts. This is the first milk you are producing  for your baby.  Your belly button may stick out.  You may notice more swelling in your hands, face, or ankles.  You may have increased tingling or numbness in your hands, arms, and legs. The skin on your belly may also feel numb.  You may feel short of breath because of your expanding uterus.  You may have more problems sleeping. This can be caused by the size of your belly, increased need to urinate, and an increase in your body's metabolism.  You may notice the fetus "dropping," or moving lower in your abdomen (lightening).  You may have increased vaginal discharge.  You may notice your joints feel loose and you may have pain around your pelvic bone.  What to expect at  prenatal visits You will have prenatal exams every 2 weeks until week 36. Then you will have weekly prenatal exams. During a routine prenatal visit:  You will be weighed to make sure you and the baby are growing normally.  Your blood pressure will be taken.  Your abdomen will be measured to track your baby's growth.  The fetal heartbeat will be listened to.  Any test results from the previous visit will be discussed.  You may have a cervical check near your due date to see if your cervix has softened or thinned (effaced).  You will be tested for Group B streptococcus. This happens between 35 and 37 weeks.  Your health care provider may ask you:  What your birth plan is.  How you are feeling.  If you are feeling the baby move.  If you have had any abnormal symptoms, such as leaking fluid, bleeding, severe headaches, or abdominal cramping.  If you are using any tobacco products, including cigarettes, chewing tobacco, and electronic cigarettes.  If you have any questions.  Other tests or screenings that may be performed during your third trimester include:  Blood tests that check for low iron levels (anemia).  Fetal testing to check the health, activity level, and growth of the fetus. Testing is done if you have certain medical conditions or if there are problems during the pregnancy.  Nonstress test (NST). This test checks the health of your baby to make sure there are no signs of problems, such as the baby not getting enough oxygen. During this test, a belt is placed around your belly. The baby is made to move, and its heart rate is monitored during movement.  What is false labor? False labor is a condition in which you feel small, irregular tightenings of the muscles in the womb (contractions) that usually go away with rest, changing position, or drinking water. These are called Braxton Hicks contractions. Contractions may last for hours, days, or even weeks before true labor  sets in. If contractions come at regular intervals, become more frequent, increase in intensity, or become painful, you should see your health care provider. What are the signs of labor?  Abdominal cramps.  Regular contractions that start at 10 minutes apart and become stronger and more frequent with time.  Contractions that start on the top of the uterus and spread down to the lower abdomen and back.  Increased pelvic pressure and dull back pain.  A watery or bloody mucus discharge that comes from the vagina.  Leaking of amniotic fluid. This is also known as your "water breaking." It could be a slow trickle or a gush. Let your health care provider know if it has a color or strange odor. If you have any  of these signs, call your health care provider right away, even if it is before your due date. Follow these instructions at home: Medicines  Follow your health care provider's instructions regarding medicine use. Specific medicines may be either safe or unsafe to take during pregnancy.  Take a prenatal vitamin that contains at least 600 micrograms (mcg) of folic acid.  If you develop constipation, try taking a stool softener if your health care provider approves. Eating and drinking  Eat a balanced diet that includes fresh fruits and vegetables, whole grains, good sources of protein such as meat, eggs, or tofu, and low-fat dairy. Your health care provider will help you determine the amount of weight gain that is right for you.  Avoid raw meat and uncooked cheese. These carry germs that can cause birth defects in the baby.  If you have low calcium intake from food, talk to your health care provider about whether you should take a daily calcium supplement.  Eat four or five small meals rather than three large meals a day.  Limit foods that are high in fat and processed sugars, such as fried and sweet foods.  To prevent constipation: ? Drink enough fluid to keep your urine clear or  pale yellow. ? Eat foods that are high in fiber, such as fresh fruits and vegetables, whole grains, and beans. Activity  Exercise only as directed by your health care provider. Most women can continue their usual exercise routine during pregnancy. Try to exercise for 30 minutes at least 5 days a week. Stop exercising if you experience uterine contractions.  Avoid heavy lifting.  Do not exercise in extreme heat or humidity, or at high altitudes.  Wear low-heel, comfortable shoes.  Practice good posture.  You may continue to have sex unless your health care provider tells you otherwise. Relieving pain and discomfort  Take frequent breaks and rest with your legs elevated if you have leg cramps or low back pain.  Take warm sitz baths to soothe any pain or discomfort caused by hemorrhoids. Use hemorrhoid cream if your health care provider approves.  Wear a good support bra to prevent discomfort from breast tenderness.  If you develop varicose veins: ? Wear support pantyhose or compression stockings as told by your healthcare provider. ? Elevate your feet for 15 minutes, 3-4 times a day. Prenatal care  Write down your questions. Take them to your prenatal visits.  Keep all your prenatal visits as told by your health care provider. This is important. Safety  Wear your seat belt at all times when driving.  Make a list of emergency phone numbers, including numbers for family, friends, the hospital, and police and fire departments. General instructions  Avoid cat litter boxes and soil used by cats. These carry germs that can cause birth defects in the baby. If you have a cat, ask someone to clean the litter box for you.  Do not travel far distances unless it is absolutely necessary and only with the approval of your health care provider.  Do not use hot tubs, steam rooms, or saunas.  Do not drink alcohol.  Do not use any products that contain nicotine or tobacco, such as  cigarettes and e-cigarettes. If you need help quitting, ask your health care provider.  Do not use any medicinal herbs or unprescribed drugs. These chemicals affect the formation and growth of the baby.  Do not douche or use tampons or scented sanitary pads.  Do not cross your legs for long  periods of time.  To prepare for the arrival of your baby: ? Take prenatal classes to understand, practice, and ask questions about labor and delivery. ? Make a trial run to the hospital. ? Visit the hospital and tour the maternity area. ? Arrange for maternity or paternity leave through employers. ? Arrange for family and friends to take care of pets while you are in the hospital. ? Purchase a rear-facing car seat and make sure you know how to install it in your car. ? Pack your hospital bag. ? Prepare the baby's nursery. Make sure to remove all pillows and stuffed animals from the baby's crib to prevent suffocation.  Visit your dentist if you have not gone during your pregnancy. Use a soft toothbrush to brush your teeth and be gentle when you floss. Contact a health care provider if:  You are unsure if you are in labor or if your water has broken.  You become dizzy.  You have mild pelvic cramps, pelvic pressure, or nagging pain in your abdominal area.  You have lower back pain.  You have persistent nausea, vomiting, or diarrhea.  You have an unusual or bad smelling vaginal discharge.  You have pain when you urinate. Get help right away if:  Your water breaks before 37 weeks.  You have regular contractions less than 5 minutes apart before 37 weeks.  You have a fever.  You are leaking fluid from your vagina.  You have spotting or bleeding from your vagina.  You have severe abdominal pain or cramping.  You have rapid weight loss or weight gain.  You have shortness of breath with chest pain.  You notice sudden or extreme swelling of your face, hands, ankles, feet, or legs.  Your  baby makes fewer than 10 movements in 2 hours.  You have severe headaches that do not go away when you take medicine.  You have vision changes. Summary  The third trimester is from week 28 through week 40, months 7 through 9. The third trimester is a time when the unborn baby (fetus) is growing rapidly.  During the third trimester, your discomfort may increase as you and your baby continue to gain weight. You may have abdominal, leg, and back pain, sleeping problems, and an increased need to urinate.  During the third trimester your breasts will keep growing and they will continue to become tender. A yellow fluid (colostrum) may leak from your breasts. This is the first milk you are producing for your baby.  False labor is a condition in which you feel small, irregular tightenings of the muscles in the womb (contractions) that eventually go away. These are called Braxton Hicks contractions. Contractions may last for hours, days, or even weeks before true labor sets in.  Signs of labor can include: abdominal cramps; regular contractions that start at 10 minutes apart and become stronger and more frequent with time; watery or bloody mucus discharge that comes from the vagina; increased pelvic pressure and dull back pain; and leaking of amniotic fluid. This information is not intended to replace advice given to you by your health care provider. Make sure you discuss any questions you have with your health care provider. Document Released: 01/15/2001 Document Revised: 06/29/2015 Document Reviewed: 03/24/2012 Elsevier Interactive Patient Education  2017 [employer].

## 2017-11-24 NOTE — Progress Notes (Signed)
   PRENATAL VISIT NOTE  Subjective:  Ashley [name] is a 22 y.o. G2P1001 at [redacted]w[redacted]d being seen today for ongoing prenatal care.  She is currently monitored for the following issues for this low-risk pregnancy and has Supervision of normal pregnancy, antepartum; Concussion without loss of consciousness; Vitamin D deficiency; HSV-2 infection; and UTI in pregnancy, antepartum, first trimester on their problem list.  Patient reports no complaints.  Contractions: Irritability. Vag. Bleeding: None.  Movement: Present. Denies leaking of fluid.   The following portions of the patient's history were reviewed and updated as appropriate: allergies, current medications, past family history, past medical history, past social history, past surgical history and problem list. Problem list updated.  Objective:   Vitals:   11/24/17 0945  BP: 115/76  Pulse: 76  Weight: 59.9 kg    Fetal Status: Fetal Heart Rate (bpm): 145 Fundal Height: 26 cm Movement: Present     General:  Alert, oriented and cooperative. Patient is in no acute distress.  Skin: Skin is warm and dry. No rash noted.   Cardiovascular: Normal heart rate noted  Respiratory: Normal respiratory effort, no problems with respiration noted  Abdomen: Soft, gravid, appropriate for gestational age.  Pain/Pressure: Present     Pelvic: Cervical exam deferred        Extremities: Normal range of motion.  Edema: None  Mental Status: Normal mood and affect. Normal behavior. Normal judgment and thought content.   Assessment and Plan:  Pregnancy: G2P1001 at [redacted]w[redacted]d  1. Supervision of other normal pregnancy, antepartum --Anticipatory guidance about next visits/weeks of pregnancy given.  2. Depression during pregnancy, antepartum --Doing well on Zoloft.  3. Anemia affecting pregnancy, antepartum --Taking PNV and iron supplement.  Preterm labor symptoms and general obstetric precautions including but not limited to vaginal bleeding, contractions, leaking  of fluid and fetal movement were reviewed in detail with the patient. Please refer to After Visit Summary for other counseling recommendations.  No follow-ups on file.  No future appointments.  Sharen Counter, CNM

## 2017-11-25 ENCOUNTER — Other Ambulatory Visit: Payer: Self-pay

## 2017-11-25 ENCOUNTER — Encounter (HOSPITAL_COMMUNITY): Payer: Self-pay

## 2017-11-25 ENCOUNTER — Inpatient Hospital Stay (HOSPITAL_COMMUNITY)
Admission: AD | Admit: 2017-11-25 | Discharge: 2017-11-25 | Payer: Medicaid Other | Source: Ambulatory Visit | Attending: Obstetrics and Gynecology | Admitting: Obstetrics and Gynecology

## 2017-11-25 ENCOUNTER — Inpatient Hospital Stay
Admission: AD | Admit: 2017-11-25 | Discharge: 2017-11-27 | DRG: 832 | Disposition: A | Discharge status: Home / Self Care | Payer: Medicaid Other | Source: Intra-hospital | Attending: Psychiatry | Admitting: Psychiatry

## 2017-11-25 ENCOUNTER — Inpatient Hospital Stay (HOSPITAL_COMMUNITY): Payer: Medicaid Other

## 2017-11-25 DIAGNOSIS — Z8249 Family history of ischemic heart disease and other diseases of the circulatory system: Secondary | ICD-10-CM | POA: Diagnosis not present

## 2017-11-25 DIAGNOSIS — O99012 Anemia complicating pregnancy, second trimester: Secondary | ICD-10-CM | POA: Diagnosis present

## 2017-11-25 DIAGNOSIS — F32A Depression, unspecified: Secondary | ICD-10-CM

## 2017-11-25 DIAGNOSIS — R45851 Suicidal ideations: Secondary | ICD-10-CM | POA: Diagnosis present

## 2017-11-25 DIAGNOSIS — O99342 Other mental disorders complicating pregnancy, second trimester: Secondary | ICD-10-CM | POA: Diagnosis present

## 2017-11-25 DIAGNOSIS — O99343 Other mental disorders complicating pregnancy, third trimester: Secondary | ICD-10-CM

## 2017-11-25 DIAGNOSIS — F329 Major depressive disorder, single episode, unspecified: Secondary | ICD-10-CM

## 2017-11-25 DIAGNOSIS — Z833 Family history of diabetes mellitus: Secondary | ICD-10-CM

## 2017-11-25 DIAGNOSIS — G43909 Migraine, unspecified, not intractable, without status migrainosus: Secondary | ICD-10-CM | POA: Diagnosis present

## 2017-11-25 DIAGNOSIS — Z880 Allergy status to penicillin: Secondary | ICD-10-CM | POA: Diagnosis not present

## 2017-11-25 DIAGNOSIS — Z3A27 27 weeks gestation of pregnancy: Secondary | ICD-10-CM | POA: Diagnosis not present

## 2017-11-25 DIAGNOSIS — O99282 Endocrine, nutritional and metabolic diseases complicating pregnancy, second trimester: Secondary | ICD-10-CM | POA: Diagnosis present

## 2017-11-25 DIAGNOSIS — F322 Major depressive disorder, single episode, severe without psychotic features: Secondary | ICD-10-CM | POA: Diagnosis present

## 2017-11-25 LAB — URINALYSIS, ROUTINE W REFLEX MICROSCOPIC
Bilirubin Urine: NEGATIVE
GLUCOSE, UA: NEGATIVE mg/dL
Hgb urine dipstick: NEGATIVE
KETONES UR: NEGATIVE mg/dL
LEUKOCYTES UA: NEGATIVE
Nitrite: NEGATIVE
PH: 8 (ref 5.0–8.0)
Protein, ur: NEGATIVE mg/dL
SPECIFIC GRAVITY, URINE: 1.021 (ref 1.005–1.030)

## 2017-11-25 LAB — RPR: RPR Ser Ql: NONREACTIVE

## 2017-11-25 LAB — GLUCOSE TOLERANCE, 2 HOURS W/ 1HR
GLUCOSE, FASTING: 71 mg/dL (ref 65–91)
Glucose, 1 hour: 124 mg/dL (ref 65–179)
Glucose, 2 hour: 85 mg/dL (ref 65–152)

## 2017-11-25 LAB — CBC
HEMOGLOBIN: 10.2 g/dL — AB (ref 11.1–15.9)
Hematocrit: 30.2 % — ABNORMAL LOW (ref 34.0–46.6)
MCH: 31.6 pg (ref 26.6–33.0)
MCHC: 33.8 g/dL (ref 31.5–35.7)
MCV: 94 fL (ref 79–97)
Platelets: 185 10*3/uL (ref 150–450)
RBC: 3.23 x10E6/uL — ABNORMAL LOW (ref 3.77–5.28)
RDW: 13.1 % (ref 12.3–15.4)
WBC: 7.3 10*3/uL (ref 3.4–10.8)

## 2017-11-25 LAB — HIV ANTIBODY (ROUTINE TESTING W REFLEX): HIV Screen 4th Generation wRfx: NONREACTIVE

## 2017-11-25 NOTE — BH Assessment (Signed)
Patient has been accepted to [facility].  Accepting physician is Dr. Toni Amend.  Attending Physician will be Dr. Johnella Moloney.  Patient has been assigned to room 302, by [facility] Charge Nurse [place].  Call report to [phone].  Patient pre-admitted by [facility] Patient Access Marcelino Duster)   [facility] Staff Selena Batten. [facility]) made aware of acceptance.
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
## 2017-11-25 NOTE — Progress Notes (Signed)
Attempted to contact Nurse Guido Sander, nurse that answered the phone said that she needed to call me back in 10-15 minutes.

## 2017-11-25 NOTE — BH Assessment (Signed)
Writer forwarded patient's information to Psych MD (Dr. Toni Amend), for review for possible admission with [facility].

## 2017-11-25 NOTE — Consult Note (Signed)
Psychiatry: Chart reviewed at the request of TTS.  Patient appears to be appropriate for admission to the inpatient psychiatric unit.  Orders will be placed to allow for transfer.

## 2017-11-25 NOTE — BH Assessment (Signed)
NP Shuvon Rankin recommends inpatient treatment. The pt is [redacted] weeks pregnant.  RN Lowella Bandy and MD were made aware of the recommendation.
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
## 2017-11-26 ENCOUNTER — Other Ambulatory Visit: Payer: Self-pay

## 2017-11-26 DIAGNOSIS — F322 Major depressive disorder, single episode, severe without psychotic features: Secondary | ICD-10-CM | POA: Diagnosis present

## 2017-11-26 LAB — LIPID PANEL
CHOL/HDL RATIO: 5.4 ratio
Cholesterol: 320 mg/dL — ABNORMAL HIGH (ref 0–200)
HDL: 59 mg/dL (ref >40)
LDL Cholesterol: 226 mg/dL — ABNORMAL HIGH (ref 0–99)
Triglycerides: 174 mg/dL — ABNORMAL HIGH (ref <150)
VLDL: 35 mg/dL (ref 0–40)

## 2017-11-26 LAB — HEMOGLOBIN A1C
Hgb A1c MFr Bld: 4.8 % (ref 4.8–5.6)
MEAN PLASMA GLUCOSE: 91.06 mg/dL

## 2017-11-26 LAB — TSH: TSH: 2.92 u[IU]/mL (ref 0.350–4.500)

## 2017-11-26 MED ORDER — ACETAMINOPHEN 325 MG PO TABS: 650.0000 mg | ORAL_TABLET | Freq: Four times a day (QID) | ORAL | Status: DC | PRN

## 2017-11-26 MED ORDER — FERROUS SULFATE 325 (65 FE) MG PO TABS
325.0000 mg | ORAL_TABLET | Freq: Two times a day (BID) | ORAL | Status: DC
  Administered 2017-11-26 – 2017-11-27 (×3): 325 mg via ORAL
  Filled 2017-11-26 (×3): qty 1

## 2017-11-26 MED ORDER — HYDROXYZINE HCL 50 MG PO TABS
50.0000 mg | ORAL_TABLET | Freq: Three times a day (TID) | ORAL | Status: DC | PRN
  Filled 2017-11-26: qty 1

## 2017-11-26 MED ORDER — TRAZODONE HCL 100 MG PO TABS: 100.0000 mg | ORAL_TABLET | Freq: Every evening | ORAL | Status: DC | PRN

## 2017-11-26 MED ORDER — DIPHENHYDRAMINE HCL 25 MG PO CAPS
50.0000 mg | ORAL_CAPSULE | Freq: Every evening | ORAL | Status: DC | PRN
  Administered 2017-11-26: 50 mg via ORAL
  Filled 2017-11-26: qty 2

## 2017-11-26 MED ORDER — MAGNESIUM HYDROXIDE 400 MG/5ML PO SUSP: 30.0000 mL | Freq: Every day | ORAL | Status: DC | PRN

## 2017-11-26 MED ORDER — PRENATAL MULTIVITAMIN CH
1.0000 | ORAL_TABLET | Freq: Every day | ORAL | Status: DC
  Administered 2017-11-26: 1 via ORAL
  Filled 2017-11-26: qty 1

## 2017-11-26 MED ORDER — ALUM & MAG HYDROXIDE-SIMETH 200-200-20 MG/5ML PO SUSP: 30.0000 mL | ORAL | Status: DC | PRN

## 2017-11-26 MED ORDER — VITAFOL GUMMIES 3.33-0.333-34.8 MG PO CHEW: 3.0000 | CHEWABLE_TABLET | Freq: Every day | ORAL | Status: DC

## 2017-11-26 NOTE — Progress Notes (Signed)
Recreation Therapy Notes  Date: 11/26/2017  Time: 9:30 am   Location: Craft room   Behavioral response: N/A   Intervention Topic: Communication  Discussion/Intervention: Patient did not attend group.   Clinical Observations/Feedback:  Patient did not attend group.   Chey Cho LRT/CTRS        Meganne Rita 11/26/2017 12:04 PM

## 2017-11-26 NOTE — Progress Notes (Addendum)
Received Ashley [name] this AM in her room after breakfast, She was compliant with her medications. She denied all of the psychiatric symptoms and said she was safe to go home. She denied feeling suicidal and verbalized being stressed related to this hospitalization. She rated anxiety 1/10 on her self inventory sheet and depression 5/10 because she wants to go home. She was OOB for the 1300 hour group therapy session. She had a visitor this PM.

## 2017-11-26 NOTE — [facility] Group Notes (Signed)
[facility] Group Notes:  (Nursing/MHT/Case Management/Adjunct)  Date:  11/26/2017  Time:  9:04 PM  Type of Therapy:  Group Therapy  Participation Level:  Active  Participation Quality:  Appropriate  Affect:  Appropriate  Cognitive:  Oriented  Insight:  Good  Engagement in Group:  Supportive  Modes of Intervention:  Discussion  Summary of Progress/Problems: Patient was attentive and fully participated in group discussion, no issues Lelan Pons 11/26/2017, 9:04 PM

## 2017-11-26 NOTE — Tx Team (Signed)
Interdisciplinary Treatment and Diagnostic Plan Update  11/26/2017 Time of Session: 11am Ashley [name] MRN: [id number]  Principal Diagnosis: <principal problem not specified>  Secondary Diagnoses: Active Problems:   Severe major depression, single episode, without psychotic features (HCC)   Current Medications:  Current Facility-Administered Medications  Medication Dose Route Frequency Provider Last Rate Last Dose  . acetaminophen (TYLENOL) tablet 650 mg  650 mg Oral Q6H PRN [name], Ashley T, MD      . alum & mag hydroxide-simeth (MAALOX/MYLANTA) 200-200-20 MG/5ML suspension 30 mL  30 mL Oral Q4H PRN [name], Ashley T, MD      . diphenhydrAMINE (BENADRYL) capsule 50 mg  50 mg Oral QHS PRN [name], Ashley R, MD      . ferrous sulfate tablet 325 mg  325 mg Oral BID WC [name], Ashley Denmark, MD   325 mg at 11/26/17 1034  . hydrOXYzine (ATARAX/VISTARIL) tablet 50 mg  50 mg Oral TID PRN [name], Ashley T, MD      . magnesium hydroxide (MILK OF MAGNESIA) suspension 30 mL  30 mL Oral Daily PRN [name], Ashley T, MD      . prenatal multivitamin tablet 1 tablet  1 tablet Oral Q1200 [name], Ashley Hutchinson, MD   1 tablet at 11/26/17 1034   PTA Medications: Medications Prior to Admission  Medication Sig Dispense Refill Last Dose  . ferrous sulfate 325 (65 FE) MG tablet Take 1 tablet (325 mg total) by mouth 2 (two) times daily with a meal. 60 tablet 5 Taking  . Prenatal Vit-Fe Phos-FA-Omega (VITAFOL GUMMIES) 3.33-0.333-34.8 MG CHEW Chew 3 each by mouth daily. 90 tablet 11 Taking  . sertraline (ZOLOFT) 50 MG tablet Take 1 tablet (50 mg total) by mouth daily. (Patient not taking: Reported on 11/24/2017) 30 tablet 5 Not Taking  . valACYclovir (VALTREX) 500 MG tablet Take for 3 days prn each outbreak. 30 tablet prn Taking  . Vitamin D, Ergocalciferol, (DRISDOL) 50000 units CAPS capsule Take 1 capsule (50,000 Units total) by mouth every 7 (seven) days. 30 capsule 2 Taking    Patient Stressors: Financial difficulties Marital  or family conflict Occupational concerns  Patient Strengths: Average or above average intelligence Capable of independent living Communication skills Supportive family/friends  Treatment Modalities: Medication Management, Group therapy, Case management,  1 to 1 session with clinician, Psychoeducation, Recreational therapy.   Physician Treatment Plan for Primary Diagnosis: <principal problem not specified> Long Term Goal(s):     Short Term Goals:    Medication Management: Evaluate patient's response, side effects, and tolerance of medication regimen.  Therapeutic Interventions: 1 to 1 sessions, Unit Group sessions and Medication administration.  Evaluation of Outcomes: Progressing  Physician Treatment Plan for Secondary Diagnosis: Active Problems:   Severe major depression, single episode, without psychotic features (HCC)  Long Term Goal(s):     Short Term Goals:       Medication Management: Evaluate patient's response, side effects, and tolerance of medication regimen.  Therapeutic Interventions: 1 to 1 sessions, Unit Group sessions and Medication administration.  Evaluation of Outcomes: Progressing   RN Treatment Plan for Primary Diagnosis: <principal problem not specified> Long Term Goal(s): Knowledge of disease and therapeutic regimen to maintain health will improve  Short Term Goals: Ability to demonstrate self-control, Ability to disclose and discuss suicidal ideas, Ability to identify and develop effective coping behaviors will improve and Compliance with prescribed medications will improve  Medication Management: RN will administer medications as ordered by provider, will assess and evaluate patient's response and provide education  to patient for prescribed medication. RN will report any adverse and/or side effects to prescribing provider.  Therapeutic Interventions: 1 on 1 counseling sessions, Psychoeducation, Medication administration, Evaluate responses to  treatment, Monitor vital signs and CBGs as ordered, Perform/monitor CIWA, COWS, AIMS and Fall Risk screenings as ordered, Perform wound care treatments as ordered.  Evaluation of Outcomes: Progressing   LCSW Treatment Plan for Primary Diagnosis: <principal problem not specified> Long Term Goal(s): Safe transition to appropriate next level of care at discharge, Engage patient in therapeutic group addressing interpersonal concerns.  Short Term Goals: Engage patient in aftercare planning with referrals and resources, Increase social support, Increase emotional regulation, Identify triggers associated with mental health/substance abuse issues and Increase skills for wellness and recovery  Therapeutic Interventions: Assess for all discharge needs, 1 to 1 time with Social worker, Explore available resources and support systems, Assess for adequacy in community support network, Educate family and significant other(s) on suicide prevention, Complete Psychosocial Assessment, Interpersonal group therapy.  Evaluation of Outcomes: Progressing   Progress in Treatment: Attending groups: No. Participating in groups: No. Taking medication as prescribed: Yes. Toleration medication: Yes. Family/Significant other contact made: Yes, individual(s) contacted:  Mom Ashley [name] Patient understands diagnosis: Yes. Discussing patient identified problems/goals with staff: Yes. Medical problems stabilized or resolved: Yes. Denies suicidal/homicidal ideation: Yes. Issues/concerns per patient self-inventory: No. Other:   New problem(s) identified: No, Describe:  None  New Short Term/Long Term Goal(s): "To not have anymore episodes."  Patient Goals:  "To go home."  Discharge Plan or Barriers: To return home and follow up with [facility] in [place].   Reason for Continuation of Hospitalization: Medication stabilization  Estimated Length of Stay: 1 day. Discharge tomorrow 11/26/2017.  Attendees: Patient:  Ashley [name] 11/26/2017 11:34 AM  Physician: Ashley Gab, MD 11/26/2017 11:34 AM  Nursing:  11/26/2017 11:34 AM  RN Care Manager: 11/26/2017 11:34 AM  Social Worker: Johny Shears, LCSWA 11/26/2017 11:34 AM  Recreational Therapist: Danella Deis. Dreama Saa, LRT 11/26/2017 11:34 AM  Other: Lowella Dandy,  LCSW 11/26/2017 11:34 AM  Other:  11/26/2017 11:34 AM  Other: 11/26/2017 11:34 AM    Scribe for Treatment Team: Johny Shears, LCSW 11/26/2017 11:34 AM

## 2017-11-26 NOTE — [facility] Suicide Risk Assessment (Signed)
[facility] Admission Suicide Risk Assessment   Nursing information obtained from:  Patient Demographic factors:  Adolescent or young adult, Low socioeconomic status Current Mental Status:  NA Loss Factors:  Financial problems / change in socioeconomic status Historical Factors:  Impulsivity Risk Reduction Factors:  Pregnancy, Religious beliefs about death  Total Time spent with patient: 1 hour Principal Problem: Severe major depression, single episode, without psychotic features (HCC) Diagnosis:   Patient Active Problem List   Diagnosis Date Noted  . Severe major depression, single episode, without psychotic features (HCC) [F32.2] 11/26/2017    Priority: High  . Depression with suicidal ideation [F32.9, R45.851] 11/25/2017  . 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 UTI in pregnancy, antepartum, first trimester [O23.41] 09/02/2017  . Vitamin D deficiency [E55.9] 08/13/2017  . Supervision of normal pregnancy, antepartum [Z34.90] 03/21/2015  . Concussion without loss of consciousness [S06.0X0A] 12/02/2014   Subjective Data: See H&P  Continued Clinical Symptoms:  Alcohol Use Disorder Identification Test Final Score (AUDIT): 0 The "Alcohol Use Disorders Identification Test", Guidelines for Use in Primary Care, Second Edition.  World Science writer [facility]). Score between 0-7:  no or low risk or alcohol related problems. Score between 8-15:  moderate risk of alcohol related problems. Score between 16-19:  high risk of alcohol related problems. Score 20 or above:  warrants further diagnostic evaluation for alcohol dependence and treatment.   CLINICAL FACTORS:  Depression, pregnancy   COGNITIVE FEATURES THAT CONTRIBUTE TO RISK:  None    SUICIDE RISK:   Mild:  Suicidal ideation of limited frequency, intensity, duration, and specificity.  There are no identifiable plans, no associated intent, mild dysphoria and related symptoms, good self-control (both objective and subjective assessment),  few other risk factors, and identifiable protective factors, including available and accessible social support.  PLAN OF CARE: See H&P  I certify that inpatient services furnished can reasonably be expected to improve the patient's condition.   Haskell Riling, MD 11/26/2017, 2:44 PM

## 2017-11-26 NOTE — Plan of Care (Signed)
  Problem: Coping: Goal: Coping ability will improve Outcome: Progressing  Patient appears  less anxious.  

## 2017-11-26 NOTE — [facility] Suicide Risk Assessment (Signed)
[facility] INPATIENT:  Family/Significant Other Suicide Prevention Education  Suicide Prevention Education:  Education Completed; Chasiti Waddington [phone] has been identified by the patient as the family member/significant other with whom the patient will be residing, and identified as the person(s) who will aid the patient in the event of a mental health crisis (suicidal ideations/suicide attempt).  With written consent from the patient, the family member/significant other has been provided the following suicide prevention education, prior to the and/or following the discharge of the patient.  The suicide prevention education provided includes the following:  Suicide risk factors  Suicide prevention and interventions  National Suicide Hotline telephone number  [facility] assessment telephone number  [facility] Emergency Assistance 911  [facility] and/or Residential Mobile Crisis Unit telephone number  Request made of family/significant other to:  Remove weapons (e.g., guns, rifles, knives), all items previously/currently identified as safety concern.    Remove drugs/medications (over-the-counter, prescriptions, illicit drugs), all items previously/currently identified as a safety concern.  The family member/significant other verbalizes understanding of the suicide prevention education information provided.  The family member/significant other agrees to remove the items of safety concern listed above.  Arrie Senate M 11/26/2017, 10:36 AM

## 2017-11-26 NOTE — Tx Team (Signed)
Initial Treatment Plan 11/26/2017 5:49 AM Ashley [name] [id number]    PATIENT STRESSORS: Financial difficulties Marital or family conflict Occupational concerns   PATIENT STRENGTHS: Average or above average intelligence Capable of independent living Communication skills Supportive family/friends   PATIENT IDENTIFIED PROBLEMS: Suicidal ideation     Depression                  DISCHARGE CRITERIA:  Improved stabilization in mood, thinking, and/or behavior Medical problems require only outpatient monitoring  PRELIMINARY DISCHARGE PLAN: Participate in family therapy  PATIENT/FAMILY INVOLVEMENT: This treatment plan has been presented to and reviewed with the patient, Ashley [name], The patient and family have been given the opportunity to ask questions and make suggestions.  Trula Ore, RN 11/26/2017, 5:49 AM
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
## 2017-11-26 NOTE — Plan of Care (Signed)
  Problem: Safety: Goal: Periods of time without injury will increase Outcome: Progressing  Patient continues to safe on the unit

## 2017-11-26 NOTE — BHH Group Notes (Signed)
  Emotional Regulation October 23 1PM  Type of Therapy/Topic:  Group Therapy:  Emotion Regulation  Participation Level: Good Participation  Description of Group:   The purpose of this group is to assist patients in learning to regulate negative emotions and experience positive emotions. Patients will be guided to discuss ways in which they have been vulnerable to their negative emotions. These vulnerabilities will be juxtaposed with experiences of positive emotions or situations, and patients will be challenged to use positive emotions to combat negative ones. Special emphasis will be placed on coping with negative emotions in conflict situations, and patients will process healthy conflict resolution skills.  Therapeutic Goals: 1. Patient will identify two positive emotions or experiences to reflect on in order to balance out negative emotions 2. Patient will label two or more emotions that they find the most difficult to experience 3. Patient will demonstrate positive conflict resolution skills through discussion and/or role plays  Summary of Patient Progress:  This patient attended and shared and supported her peers. Patient was able to follow content and make appropriate remarks to her peers.     Therapeutic Modalities:   Cognitive Behavioral Therapy Feelings Identification Dialectical Behavioral Therapy   Fantasy Donald LCSW 2:15 pm

## 2017-11-26 NOTE — BHH Counselor (Signed)
Adult Comprehensive Assessment  Patient ID: Ashley [name], female   DOB: 1995-08-13, 22 y.o.   MRN: [id number]  Information Source: Information source: Patient  Current Stressors:  Patient states their primary concerns and needs for treatment are:: Suicidal thoughts and depression Patient states their goals for this hospitilization and ongoing recovery are:: To obtain therapeutic help Educational / Learning stressors: na Employment / Job issues: na Family Relationships: na Surveyor, quantity / Lack of resources (include bankruptcy): na Housing / Lack of housing: na Physical health (include injuries & life threatening diseases): Pregnant Social relationships: recent breakup Substance abuse: na Bereavement / Loss: na  Living/Environment/Situation:  Living Arrangements: Children, Parent What is atmosphere in current home: Comfortable  Family History:  Marital status: Single Are you sexually active?: No What is your sexual orientation?: heterosexual Has your sexual activity been affected by drugs, alcohol, medication, or emotional stress?: no Does patient have children?: Yes How many children?: 1 How is patient's relationship with their children?: Good expecting another son soon  Childhood History:  By whom was/is the patient raised?: Both parents Additional childhood history information: excellent family Description of patient's relationship with caregiver when they were a child: good Patient's description of current relationship with people who raised him/her: good How were you disciplined when you got in trouble as a child/adolescent?: reasonable Does patient have siblings?: Yes Number of Siblings: 1 Description of patient's current relationship with siblings: good its an older sister Did patient suffer any verbal/emotional/physical/sexual abuse as a child?: No Did patient suffer from severe childhood neglect?: No Has patient ever been sexually abused/assaulted/raped as an adolescent  or adult?: No Was the patient ever a victim of a crime or a disaster?: No Witnessed domestic violence?: No Has patient been effected by domestic violence as an adult?: No  Education:  Highest grade of school patient has completed: 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- college Currently a student?: Yes Name of school: [employer] How long has the patient attended?: 2 years Learning disability?: No  Employment/Work Situation:   Employment situation: Employed 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 How long has patient been employed?: Didnt mention Patient's job has been impacted by current illness: No What is the longest time patient has a held a job?: TBD Did You Receive Any Psychiatric Treatment/Services While in Equities trader?: No Are There Guns or Other Weapons in Your Home?: No Are These Geophysical data processor Secured?: (na)  Financial Resources:   Financial resources: Income from employment, Support from parents / caregiver, Medicaid  Alcohol/Substance Abuse:   What has been your use of drugs/alcohol within the last 12 months?: none If attempted suicide, did drugs/alcohol play a role in this?: No Alcohol/Substance Abuse Treatment Hx: Denies past history Has alcohol/substance abuse ever caused legal problems?: No  Social Support System:   Conservation officer, nature Support System: Fair Development worker, community Support System: My parents, church, OBGYN- Insurance account manager Type of faith/religion: Ephriam Knuckles How does patient's faith help to cope with current illness?: yes  Leisure/Recreation:   Leisure and Hobbies: Dancing- helping others-my son family events  Strengths/Needs:   What is the patient's perception of their strengths?: Loving and good mother Patient states they can use these personal strengths during their treatment to contribute to their recovery: yes Patient states these barriers may affect/interfere with their treatment: not having consistant therapy Patient states these  barriers may affect their return to the community: none Other important information patient would like considered in planning for their treatment: would like a therapist and see them every 2  weeks  Discharge Plan:   Currently receiving community mental health services: No Patient states concerns and preferences for aftercare planning are: [facility] Patient states they will know when they are safe and ready for discharge when: Doctor and family allow it Does patient have access to transportation?: Yes Does patient have financial barriers related to discharge medications?: No Patient description of barriers related to discharge medications: none patient has medicaid Will patient be returning to same living situation after discharge?: Yes  Summary/Recommendations:   Ashley [name]. 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 She has come to ED after having strong suicidal thoughts. She reports they were present a month prior and became stronger .Patient has a 69 year old son and lives with her parents who are a good support for her and son. She is completing her college program in december and has a part time job at [employer]  general. This patient reports she has many positive goals for the future and her family is a great support. Patient is agreeable to attend group, eat well and rest while in the BMU. She reports she is not suicidal now and and is not experiencing any audio or visual hallucinations. She reports the recent break up of her babys father lead her to this depression.Pt agreeable and has signed consents for [facility] and to call her mother for SPE. Patient is able to return home.

## 2017-11-26 NOTE — Progress Notes (Signed)
Admission Note:  23 yr female, [redacted] weeks pregnant, who presents voluntary commitment in no acute distress for the treatment of SI and Depression. Patient appears flat and sad, very tearful and stated " I am so fearful" patient was reassured and given some emotional support and she was calm and cooperative with admission process. Patient presents with passive SI and contracts for safety upon admission. Patient however denies AVH . Patient stated " I  experienced worsening depression and anxiety attack after taking on single dose of Zoloft in early September ". Patient lives with a two year old son, goes to [employer] and she is Glass blower/designer in IT consultant. Patient has no Past medical Hx. Skin was assessed in presence of jessica MHT and found to be warm, dry and intact. Patient multiple tattoos, was searched and no contraband found, POC and unit policies explained and understanding verbalized. Consents obtained. Food and fluids offered, but patient refused because she has her food. 15 minutes safety checks maintained will continue to monitor.

## 2017-11-26 NOTE — Plan of Care (Addendum)
  Problem: Coping: Goal: Ability to verbalize frustrations and anger appropriately will improve Outcome: Progressing   Problem: Education: Goal: Ability to make informed decisions regarding treatment will improve Outcome: Progressing  patient verbalized frustration that she wants to be discharged soon

## 2017-11-27 NOTE — Progress Notes (Signed)
Patient alert and oriented x 4, denies pain and discomfort, affect is brighter, interacting appropriately with peers and staff, thoughts are organized and coherent no bizarre behavior noted, 15 minutes safety checks maintained will continue to monitor.

## 2017-11-27 NOTE — [facility] Suicide Risk Assessment (Signed)
[facility] Discharge Suicide Risk Assessment   Principal Problem: Severe major depression, single episode, without psychotic features [facility]) Discharge Diagnoses:  Patient Active Problem List   Diagnosis Date Noted  . Severe major depression, single episode, without psychotic features (HCC) [F32.2] 11/26/2017    Priority: High  . Depression with suicidal ideation [F32.9, R45.851] 11/25/2017  . 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 UTI in pregnancy, antepartum, first trimester [O23.41] 09/02/2017  . Vitamin D deficiency [E55.9] 08/13/2017  . Supervision of normal pregnancy, antepartum [Z34.90] 03/21/2015  . Concussion without loss of consciousness [S06.0X0A] 12/02/2014   Mental Status Per Nursing Assessment::   On Admission:  NA  Demographic Factors:  Adolescent or young adult  Loss Factors: NA  Historical Factors: Prior suicide attempts  Risk Reduction Factors:   Pregnancy, Responsible for children under 30 years of age, Sense of responsibility to family, Employed, Living with another person, especially a relative, Positive social support, Positive therapeutic relationship and Positive coping skills or problem solving skills  Continued Clinical Symptoms:  Depression  Cognitive Features That Contribute To Risk:  None    Suicide Risk:  Minimal Acute Risk: No identifiable suicidal ideation.   Follow-up Information    [facility] Follow up.   Why:  Please follow up with [facility] on Friday, November 28, 2017 at 12:15pm. Be sure to bring your social security card, ID, proof of income and insurance card. Thank you. Contact information: [address] [place] [address] [phone]           Haskell Riling, MD 11/27/2017, 8:23 AM
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
## 2017-11-27 NOTE — Progress Notes (Signed)
Recreation Therapy Notes  Date: 11/27/2017  Time: 9:30 am  Location: Craft Room  Behavioral response: Appropriate  Intervention Topic: Problem Solving  Discussion/Intervention:  Group content on today was focused on problem solving. The group described what problem solving is. Patients expressed how problems affect them and how they deal with problems. Individuals identified healthy ways to deal with problems. Patients explained what normally happens to them when they do not deal with problems. The group expressed reoccurring problems for them. The group participated in the intervention "Put the story together" with their peers and worked together to put a story that was broken up in the correct order. Clinical Observations/Feedback:  Patient came to group and was focused on what peers and staff had to say about problem solving. Individual was social peers and staff while participating in the intervention. Shahla Betsill LRT/CTRS         Evia Goldsmith 11/27/2017 11:41 AM

## 2017-11-27 NOTE — Progress Notes (Signed)
Patient denies SI/HI, denies A/V hallucinations. Patient verbalizes understanding of discharge instructions, follow up care and prescriptions. Patient given all belongings from [facility] locker. Patient escorted out by staff, transported by family. 

## 2017-11-27 NOTE — Plan of Care (Signed)
  Problem: Safety: Goal: Periods of time without injury will increase Outcome: Progressing  Patient continues to be safe on injury.

## 2017-11-27 NOTE — Progress Notes (Signed)
  [facility] Adult Case Management Discharge Plan :  Will you be returning to the same living situation after discharge:  Yes,  with parents  At discharge, do you have transportation home?: Yes,  parents will provide transportation Do you have the ability to pay for your medications: Yes,  insurance  Release of information consent forms completed and in the chart;  Patient's signature needed at discharge.  Patient to Follow up at: Follow-up Information    [facility] Follow up.   Why:  Please follow up with [facility] on Friday, November 28, 2017 at 12:15pm. Be sure to bring your social security card, ID, proof of income and insurance card. Thank you. Contact information: [address] [name] [address] [phone]           Next level of care provider has access to [facility] Link:no  Safety Planning and Suicide Prevention discussed: Yes,  Lurene Shadow, mother  Have you used any form of tobacco in the last 30 days? (Cigarettes, Smokeless Tobacco, Cigars, and/or Pipes): No  Has patient been referred to the Quitline?: N/A patient is not a smoker  Patient has been referred for addiction treatment: N/A  Suzan Slick, LCSW 11/27/2017, 9:41 AM

## 2017-12-08 ENCOUNTER — Encounter: Payer: Self-pay | Admitting: Obstetrics and Gynecology

## 2017-12-08 ENCOUNTER — Other Ambulatory Visit: Payer: Self-pay

## 2017-12-08 ENCOUNTER — Ambulatory Visit (INDEPENDENT_AMBULATORY_CARE_PROVIDER_SITE_OTHER): Payer: Medicaid Other | Admitting: Obstetrics and Gynecology

## 2017-12-08 VITALS — BP 103/67 | HR 86 | Wt 137.0 lb

## 2017-12-08 DIAGNOSIS — Z3480 Encounter for supervision of other normal pregnancy, unspecified trimester: Secondary | ICD-10-CM

## 2017-12-08 NOTE — Progress Notes (Signed)
   PRENATAL VISIT NOTE  Subjective:  Ashley [name] is a 22 y.o. G2P1001 at [redacted]w[redacted]d being seen today for ongoing prenatal care.  She is currently monitored for the following issues for this low-risk pregnancy and has Supervision of normal pregnancy, antepartum; Concussion without loss of consciousness; Vitamin D deficiency; HSV-2 infection; UTI in pregnancy, antepartum, first trimester; Depression with suicidal ideation; and Severe major depression, single episode, without psychotic features (HCC) on their problem list.  Patient reports no complaints.  Contractions: Irritability. Vag. Bleeding: None.  Movement: Present. Denies leaking of fluid.   The following portions of the patient's history were reviewed and updated as appropriate: allergies, current medications, past family history, past medical history, past social history, past surgical history and problem list. Problem list updated.  Objective:   Vitals:   12/08/17 1348  BP: 103/67  Pulse: 86  Weight: 137 lb (62.1 kg)    Fetal Status: Fetal Heart Rate (bpm): 152 Fundal Height: 28 cm Movement: Present     General:  Alert, oriented and cooperative. Patient is in no acute distress.  Skin: Skin is warm and dry. No rash noted.   Cardiovascular: Normal heart rate noted  Respiratory: Normal respiratory effort, no problems with respiration noted  Abdomen: Soft, gravid, appropriate for gestational age.  Pain/Pressure: Absent     Pelvic: Cervical exam deferred        Extremities: Normal range of motion.  Edema: None  Mental Status: Normal mood and affect. Normal behavior. Normal judgment and thought content.   Assessment and Plan:  Pregnancy: G2P1001 at [redacted]w[redacted]d  1. Supervision of other normal pregnancy, antepartum Patient is doing well without complaints She denies any depressive symptoms- stable on zoloft  Preterm labor symptoms and general obstetric precautions including but not limited to vaginal bleeding, contractions, leaking of  fluid and fetal movement were reviewed in detail with the patient. Please refer to After Visit Summary for other counseling recommendations.  Return in about 2 weeks (around 12/22/2017) for ROB.  No future appointments.  Catalina Antigua, MD

## 2017-12-22 ENCOUNTER — Encounter: Payer: Self-pay | Admitting: Certified Nurse Midwife

## 2017-12-22 ENCOUNTER — Ambulatory Visit (INDEPENDENT_AMBULATORY_CARE_PROVIDER_SITE_OTHER): Payer: Medicaid Other | Admitting: Certified Nurse Midwife

## 2017-12-22 DIAGNOSIS — Z3483 Encounter for supervision of other normal pregnancy, third trimester: Secondary | ICD-10-CM

## 2017-12-22 DIAGNOSIS — Z3480 Encounter for supervision of other normal pregnancy, unspecified trimester: Secondary | ICD-10-CM

## 2017-12-22 NOTE — Progress Notes (Signed)
   PRENATAL VISIT NOTE  Subjective:  Ashley [name] is a 22 y.o. G2P1001 at 4577w2d being seen today for ongoing prenatal care.  She is currently monitored for the following issues for this low-risk pregnancy and has Supervision of normal pregnancy, antepartum; Concussion without loss of consciousness; Vitamin D deficiency; HSV-2 infection; UTI in pregnancy, antepartum, first trimester; Depression with suicidal ideation; and Severe major depression, single episode, without psychotic features (HCC) on their problem list.  Patient reports no complaints.  Contractions: Irritability. Vag. Bleeding: None.  Movement: Present. Denies leaking of fluid.   The following portions of the patient's history were reviewed and updated as appropriate: allergies, current medications, past family history, past medical history, past social history, past surgical history and problem list. Problem list updated.  Objective:   Vitals:   12/22/17 1410  BP: 109/74  Pulse: 73  Weight: 140 lb (63.5 kg)    Fetal Status: Fetal Heart Rate (bpm): 148 Fundal Height: 29 cm Movement: Present     General:  Alert, oriented and cooperative. Patient is in no acute distress.  Skin: Skin is warm and dry. No rash noted.   Cardiovascular: Normal heart rate noted  Respiratory: Normal respiratory effort, no problems with respiration noted  Abdomen: Soft, gravid, appropriate for gestational age.  Pain/Pressure: Present     Pelvic: Cervical exam deferred        Extremities: Normal range of motion.  Edema: None  Mental Status: Normal mood and affect. Normal behavior. Normal judgment and thought content.   Assessment and Plan:  Pregnancy: G2P1001 at 7277w2d  1. Supervision of other normal pregnancy, antepartum - Patient doing well, no complaints  - Reports being excited that she is closer to her due date and closer to seeing him - She reports she does not take Zoloft and has been off medication since October, she does not want to be  on medication at this time  - Continued counseling appointments with [facility] offered, patient declines at this time  - She denies suicidal ideation or symptoms of depression  - Anticipatory guidance on upcoming appointments   Preterm labor symptoms and general obstetric precautions including but not limited to vaginal bleeding, contractions, leaking of fluid and fetal movement were reviewed in detail with the patient. Please refer to After Visit Summary for other counseling recommendations.  Return in about 2 weeks (around 01/05/2018) for ROB.  Future Appointments  Date Time Provider Department Center  01/07/2018  2:45 PM Sharyon Cableogers, Tierany Appleby C, CNM [facility] None    Sharyon CableVeronica C Bransyn Adami, CNM

## 2017-12-22 NOTE — Patient Instructions (Signed)

## 2017-12-29 ENCOUNTER — Telehealth: Payer: Self-pay

## 2017-12-29 NOTE — Telephone Encounter (Signed)
Pt called and stated that she is feeling some contractions that are somewhat painful about every 30 minutes at night and she has also had a couple braxton hicks today. Pt denies leaking of fluid or bleeding, reports good fetal movement, denies headache/vision change, denies swelling. I advised pt that these are braxton hicks contractions and she should keep an eye on them, should they become more painful or closer together she should go to [facility] for evaluation, pt verbalized understanding.

## 2017-12-31 ENCOUNTER — Telehealth: Payer: Self-pay

## 2017-12-31 NOTE — Telephone Encounter (Signed)
Patient called stating that shes having some itching and swelling in her vaginal area. Denies any discharge/bleeding, denies odor. Lisa CNM  Advised Monistat 7 day OTC treatment for this patient. I advised her to start taking it and if she is not better by Monday to give us a call and we will have her come in to evaluate, pt verbalized understanding.

## 2018-01-07 ENCOUNTER — Encounter: Payer: Self-pay | Admitting: Certified Nurse Midwife

## 2018-01-07 ENCOUNTER — Ambulatory Visit (INDEPENDENT_AMBULATORY_CARE_PROVIDER_SITE_OTHER): Payer: Medicaid Other | Admitting: Certified Nurse Midwife

## 2018-01-07 VITALS — BP 104/63 | HR 82 | Wt 139.0 lb

## 2018-01-07 DIAGNOSIS — Z3483 Encounter for supervision of other normal pregnancy, third trimester: Secondary | ICD-10-CM

## 2018-01-07 DIAGNOSIS — Z3A33 33 weeks gestation of pregnancy: Secondary | ICD-10-CM

## 2018-01-07 DIAGNOSIS — B009 Herpesviral infection, unspecified: Secondary | ICD-10-CM

## 2018-01-07 DIAGNOSIS — Z3480 Encounter for supervision of other normal pregnancy, unspecified trimester: Secondary | ICD-10-CM

## 2018-01-07 NOTE — Progress Notes (Signed)
Pt is here for ROB. G2P1 166w4d.

## 2018-01-07 NOTE — Patient Instructions (Signed)

## 2018-01-07 NOTE — Progress Notes (Signed)
   PRENATAL VISIT NOTE  Subjective:  Ashley [name] is a 22 y.o. G2P1001 at 2354w4d being seen today for ongoing prenatal care.  She is currently monitored for the following issues for this low-risk pregnancy and has Supervision of normal pregnancy, antepartum; Concussion without loss of consciousness; Vitamin D deficiency; HSV-2 infection; UTI in pregnancy, antepartum, first trimester; Depression with suicidal ideation; and Severe major depression, single episode, without psychotic features (HCC) on their problem list.  Patient reports fatigue.  Contractions: Irregular. Vag. Bleeding: None.  Movement: Present. Denies leaking of fluid.   The following portions of the patient's history were reviewed and updated as appropriate: allergies, current medications, past family history, past medical history, past social history, past surgical history and problem list. Problem list updated.  Objective:   Vitals:   01/07/18 1504  BP: 104/63  Pulse: 82  Weight: 139 lb (63 kg)    Fetal Status: Fetal Heart Rate (bpm): 154 Fundal Height: 31 cm Movement: Present     General:  Alert, oriented and cooperative. Patient is in no acute distress.  Skin: Skin is warm and dry. No rash noted.   Cardiovascular: Normal heart rate noted  Respiratory: Normal respiratory effort, no problems with respiration noted  Abdomen: Soft, gravid, appropriate for gestational age.  Pain/Pressure: Present     Pelvic: Cervical exam deferred        Extremities: Normal range of motion.  Edema: None  Mental Status: Normal mood and affect. Normal behavior. Normal judgment and thought content.   Assessment and Plan:  Pregnancy: G2P1001 at 7254w4d  1. Supervision of other normal pregnancy, antepartum - Patient doing well, complains of fatigue due to not being able to sleep through the night related to back pain. Currently taking unisom which is not helping. Discussed used of Tylenol PM occasionally, patient verbalizes understanding  -  Anticipatory guidance on upcoming appointments with GBS screening at next appointment   2. HSV-2 infection - valtrex suppression to be started after next appointment   Preterm labor symptoms and general obstetric precautions including but not limited to vaginal bleeding, contractions, leaking of fluid and fetal movement were reviewed in detail with the patient. Please refer to After Visit Summary for other counseling recommendations.  Return in about 2 weeks (around 01/21/2018) for ROB.  Future Appointments  Date Time Provider Department Center  01/21/2018  2:30 PM Sharyon Cableogers, Venda Dice C, CNM [facility] None    Sharyon CableVeronica C Letricia Krinsky, CNM

## 2018-01-21 ENCOUNTER — Ambulatory Visit (INDEPENDENT_AMBULATORY_CARE_PROVIDER_SITE_OTHER): Payer: Medicaid Other | Admitting: Certified Nurse Midwife

## 2018-01-21 ENCOUNTER — Other Ambulatory Visit (HOSPITAL_COMMUNITY)
Admission: RE | Admit: 2018-01-21 | Discharge: 2018-01-21 | Disposition: A | Discharge status: Home / Self Care | Payer: Medicaid Other | Source: Ambulatory Visit | Attending: Certified Nurse Midwife | Admitting: Certified Nurse Midwife

## 2018-01-21 ENCOUNTER — Other Ambulatory Visit: Payer: Self-pay

## 2018-01-21 ENCOUNTER — Encounter: Payer: Self-pay | Admitting: Certified Nurse Midwife

## 2018-01-21 VITALS — BP 125/79 | HR 101 | Wt 144.2 lb

## 2018-01-21 DIAGNOSIS — Z3480 Encounter for supervision of other normal pregnancy, unspecified trimester: Secondary | ICD-10-CM | POA: Diagnosis present

## 2018-01-21 DIAGNOSIS — B009 Herpesviral infection, unspecified: Secondary | ICD-10-CM

## 2018-01-21 DIAGNOSIS — Z3483 Encounter for supervision of other normal pregnancy, third trimester: Secondary | ICD-10-CM

## 2018-01-21 MED ORDER — VALACYCLOVIR HCL 500 MG PO TABS: 500.0000 mg | ORAL_TABLET | Freq: Two times a day (BID) | ORAL | 1 refills | Status: DC

## 2018-01-21 NOTE — Progress Notes (Signed)
   PRENATAL VISIT NOTE  Subjective:  Ashley [name] is a 22 y.o. G2P1001 at 4650w4d being seen today for ongoing prenatal care.  She is currently monitored for the following issues for this low-risk pregnancy and has Supervision of normal pregnancy, antepartum; Concussion without loss of consciousness; Vitamin D deficiency; HSV-2 infection; UTI in pregnancy, antepartum, first trimester; Depression with suicidal ideation; and Severe major depression, single episode, without psychotic features (HCC) on their problem list.  Patient reports no complaints.  Contractions: Not present. Vag. Bleeding: None.  Movement: Present. Denies leaking of fluid.   The following portions of the patient's history were reviewed and updated as appropriate: allergies, current medications, past family history, past medical history, past social history, past surgical history and problem list. Problem list updated.  Objective:   Vitals:   01/21/18 1440  BP: 125/79  Pulse: (!) 101  Weight: 144 lb 3.2 oz (65.4 kg)    Fetal Status: Fetal Heart Rate (bpm): 165 Fundal Height: 33 cm Movement: Present  Presentation: Vertex  General:  Alert, oriented and cooperative. Patient is in no acute distress.  Skin: Skin is warm and dry. No rash noted.   Cardiovascular: Normal heart rate noted  Respiratory: Normal respiratory effort, no problems with respiration noted  Abdomen: Soft, gravid, appropriate for gestational age.  Pain/Pressure: Present     Pelvic: Cervical exam performed Dilation: Closed Effacement (%): Thick Station: -3  Extremities: Normal range of motion.  Edema: Trace  Mental Status: Normal mood and affect. Normal behavior. Normal judgment and thought content.   Assessment and Plan:  Pregnancy: G2P1001 at 7050w4d  1. Supervision of other normal pregnancy, antepartum - Patient doing well, no complaints - No SI or signs of depression at this time  - Anticipatory guidance on upcoming appointments  - Cervicovaginal  ancillary only( [place]) - Strep Gp B Culture+Rflx  2. HSV-2 infection - Suppression initiated  - valACYclovir (VALTREX) 500 MG tablet; Take 1 tablet (500 mg total) by mouth 2 (two) times daily.  Dispense: 60 tablet; Refill: 1  Preterm labor symptoms and general obstetric precautions including but not limited to vaginal bleeding, contractions, leaking of fluid and fetal movement were reviewed in detail with the patient. Please refer to After Visit Summary for other counseling recommendations.  Return in about 12 days (around 02/02/2018) for ROB.  Future Appointments  Date Time Provider Department Center  02/03/2018  8:45 AM Arvilla MarketWallace, Catherine Lauren, DO [facility] None    Ashley [name], CNM

## 2018-01-21 NOTE — Patient Instructions (Signed)
Reasons to go to [facility]:  1.  Contractions are  5 minutes apart or less, each last 1 minute, these have been going on for 1-2 hours, and you cannot walk or talk during them 2.  You have a large gush of fluid, or a trickle of fluid that will not stop and you have to wear a pad 3.  You have bleeding that is bright red, heavier than spotting--like menstrual bleeding (spotting can be normal in early labor or after a check of your cervix) 4.  You do not feel the baby moving like he/she normally does  

## 2018-01-22 LAB — CERVICOVAGINAL ANCILLARY ONLY
Chlamydia: NEGATIVE
Neisseria Gonorrhea: NEGATIVE

## 2018-01-25 LAB — STREP GP B CULTURE+RFLX: Strep Gp B Culture+Rflx: NEGATIVE

## 2018-02-03 ENCOUNTER — Ambulatory Visit (INDEPENDENT_AMBULATORY_CARE_PROVIDER_SITE_OTHER): Payer: Medicaid Other | Admitting: Internal Medicine

## 2018-02-03 VITALS — BP 128/77 | HR 86 | Wt 148.8 lb

## 2018-02-03 DIAGNOSIS — B009 Herpesviral infection, unspecified: Secondary | ICD-10-CM

## 2018-02-03 DIAGNOSIS — Z3480 Encounter for supervision of other normal pregnancy, unspecified trimester: Secondary | ICD-10-CM

## 2018-02-03 DIAGNOSIS — Z3483 Encounter for supervision of other normal pregnancy, third trimester: Secondary | ICD-10-CM

## 2018-02-03 NOTE — Patient Instructions (Signed)
Call the office or go to [facility] if:  You begin to have strong, frequent contractions  Your water breaks.  Sometimes it is a big gush of fluid, sometimes it is just a trickle that keeps getting your panties wet or running down your legs  You have vaginal bleeding.  It is normal to have a small amount of spotting if your cervix was checked.   You don't feel your baby moving like normal.  If you don't, get you something to eat and drink and lay down and focus on feeling your baby move.  You should feel at least 10 movements in 2 hours.  If you don't, you should call the office or go to [facility].    Tdap Vaccine  It is recommended that you get the Tdap vaccine during the third trimester of EACH pregnancy to help protect your baby from getting pertussis (whooping cough)  27-36 weeks is the BEST time to do this so that you can pass the protection on to your baby. During pregnancy is better than after pregnancy, but if you are unable to get it during pregnancy it will be offered at the hospital.   You can get this vaccine with us, at the health department, your family doctor, or some local pharmacies  Everyone who will be around your baby should also be up-to-date on their vaccines before the baby comes. Adults (who are not pregnant) only need 1 dose of Tdap during adulthood.   Third Trimester of Pregnancy The third trimester is from week 29 through week 42, months 7 through 9. The third trimester is a time when the fetus is growing rapidly. At the end of the ninth month, the fetus is about 20 inches in length and weighs 6-10 pounds.  BODY CHANGES Your body goes through many changes during pregnancy. The changes vary from woman to woman.   Your weight will continue to increase. You can expect to gain 25-35 pounds (11-16 kg) by the end of the pregnancy.  You may begin to get stretch marks on your hips, abdomen, and breasts.  You may urinate more often because the fetus is  moving lower into your pelvis and pressing on your bladder.  You may develop or continue to have heartburn as a result of your pregnancy.  You may develop constipation because certain hormones are causing the muscles that push waste through your intestines to slow down.  You may develop hemorrhoids or swollen, bulging veins (varicose veins).  You may have pelvic pain because of the weight gain and pregnancy hormones relaxing your joints between the bones in your pelvis. Backaches may result from overexertion of the muscles supporting your posture.  You may have changes in your hair. These can include thickening of your hair, rapid growth, and changes in texture. Some women also have hair loss during or after pregnancy, or hair that feels dry or thin. Your hair will most likely return to normal after your baby is born.  Your breasts will continue to grow and be tender. A yellow discharge may leak from your breasts called colostrum.  Your belly button may stick out.  You may feel short of breath because of your expanding uterus.  You may notice the fetus "dropping," or moving lower in your abdomen.  You may have a bloody mucus discharge. This usually occurs a few days to a week before labor begins.  Your cervix becomes thin and soft (effaced) near your due date. WHAT TO EXPECT AT   YOUR PRENATAL EXAMS  You will have prenatal exams every 2 weeks until week 89. Then, you will have weekly prenatal exams. During a routine prenatal visit:  You will be weighed to make sure you and the fetus are growing normally.  Your blood pressure is taken.  Your abdomen will be measured to track your baby's growth.  The fetal heartbeat will be listened to.  Any test results from the previous visit will be discussed.  You may have a cervical check near your due date to see if you have effaced. At around 36 weeks, your caregiver will check your cervix. At the same time, your caregiver will also perform a  test on the secretions of the vaginal tissue. This test is to determine if a type of bacteria, Group B streptococcus, is present. Your caregiver will explain this further. Your caregiver may ask you:  What your birth plan is.  How you are feeling.  If you are feeling the baby move.  If you have had any abnormal symptoms, such as leaking fluid, bleeding, severe headaches, or abdominal cramping.  If you have any questions. Other tests or screenings that may be performed during your third trimester include:  Blood tests that check for low iron levels (anemia).  Fetal testing to check the health, activity level, and growth of the fetus. Testing is done if you have certain medical conditions or if there are problems during the pregnancy. FALSE LABOR You may feel small, irregular contractions that eventually go away. These are called Braxton Hicks contractions, or false labor. Contractions may last for hours, days, or even weeks before true labor sets in. If contractions come at regular intervals, intensify, or become painful, it is best to be seen by your caregiver.  SIGNS OF LABOR   Menstrual-like cramps.  Contractions that are 5 minutes apart or less.  Contractions that start on the top of the uterus and spread down to the lower abdomen and back.  A sense of increased pelvic pressure or back pain.  A watery or bloody mucus discharge that comes from the vagina. If you have any of these signs before the 37th week of pregnancy, call your caregiver right away. You need to go to the hospital to get checked immediately. HOME CARE INSTRUCTIONS   Avoid all smoking, herbs, alcohol, and unprescribed drugs. These chemicals affect the formation and growth of the baby.  Follow your caregiver's instructions regarding medicine use. There are medicines that are either safe or unsafe to take during pregnancy.  Exercise only as directed by your caregiver. Experiencing uterine cramps is a good sign to  stop exercising.  Continue to eat regular, healthy meals.  Wear a good support bra for breast tenderness.  Do not use hot tubs, steam rooms, or saunas.  Wear your seat belt at all times when driving.  Avoid raw meat, uncooked cheese, cat litter boxes, and soil used by cats. These carry germs that can cause birth defects in the baby.  Take your prenatal vitamins.  Try taking a stool softener (if your caregiver approves) if you develop constipation. Eat more high-fiber foods, such as fresh vegetables or fruit and whole grains. Drink plenty of fluids to keep your urine clear or pale yellow.  Take warm sitz baths to soothe any pain or discomfort caused by hemorrhoids. Use hemorrhoid cream if your caregiver approves.  If you develop varicose veins, wear support hose. Elevate your feet for 15 minutes, 3-4 times a day. Limit salt in your  diet.  Avoid heavy lifting, wear low heal shoes, and practice good posture.  Rest a lot with your legs elevated if you have leg cramps or low back pain.  Visit your dentist if you have not gone during your pregnancy. Use a soft toothbrush to brush your teeth and be gentle when you floss.  A sexual relationship may be continued unless your caregiver directs you otherwise.  Do not travel far distances unless it is absolutely necessary and only with the approval of your caregiver.  Take prenatal classes to understand, practice, and ask questions about the labor and delivery.  Make a trial run to the hospital.  Pack your hospital bag.  Prepare the baby's nursery.  Continue to go to all your prenatal visits as directed by your caregiver. SEEK MEDICAL CARE IF:  You are unsure if you are in labor or if your water has broken.  You have dizziness.  You have mild pelvic cramps, pelvic pressure, or nagging pain in your abdominal area.  You have persistent nausea, vomiting, or diarrhea.  You have a bad smelling vaginal discharge.  You have pain with  urination. SEEK IMMEDIATE MEDICAL CARE IF:   You have a fever.  You are leaking fluid from your vagina.  You have spotting or bleeding from your vagina.  You have severe abdominal cramping or pain.  You have rapid weight loss or gain.  You have shortness of breath with chest pain.  You notice sudden or extreme swelling of your face, hands, ankles, feet, or legs.  You have not felt your baby move in over an hour.  You have severe headaches that do not go away with medicine.  You have vision changes. Document Released: 01/15/2001 Document Revised: 01/26/2013 Document Reviewed: 03/24/2012 [facility] Patient Information 2015 [place], [place]. This information is not intended to replace advice given to you by your health care provider. Make sure you discuss any questions you have with your health care provider.

## 2018-02-03 NOTE — Progress Notes (Signed)
   PRENATAL VISIT NOTE  Subjective:  Ashley [name] is a 22 y.o. G2P1001 at 5726w3d being seen today for ongoing prenatal care.  She is currently monitored for the following issues for this low-risk pregnancy and has Supervision of normal pregnancy, antepartum; Concussion without loss of consciousness; Vitamin D deficiency; HSV-2 infection; UTI in pregnancy, antepartum, first trimester; Depression with suicidal ideation; and Severe major depression, single episode, without psychotic features (HCC) on their problem list.  Patient reports no complaints.  Contractions: Irritability. Vag. Bleeding: None.  Movement: Present. Denies leaking of fluid.   The following portions of the patient's history were reviewed and updated as appropriate: allergies, current medications, past family history, past medical history, past social history, past surgical history and problem list. Problem list updated.  Objective:   Vitals:   02/03/18 0908  BP: 128/77  Pulse: 86  Weight: 148 lb 12.8 oz (67.5 kg)    Fetal Status: Fetal Heart Rate (bpm): 149   Movement: Present     General:  Alert, oriented and cooperative. Patient is in no acute distress.  Skin: Skin is warm and dry. No rash noted.   Cardiovascular: Normal heart rate noted  Respiratory: Normal respiratory effort, no problems with respiration noted  Abdomen: Soft, gravid, appropriate for gestational age.  Pain/Pressure: Present     Pelvic: Cervical exam deferred        Extremities: Normal range of motion.  Edema: Trace  Mental Status: Normal mood and affect. Normal behavior. Normal judgment and thought content.   Assessment and Plan:  Pregnancy: G2P1001 at 6226w3d  1. Supervision of other normal pregnancy, antepartum Continue routine PNC.  GBS neg.  Declines flu and Tdap. Counseled.   2. HSV-2 infection Patient on suppressive therapy. Denies recent outbreak or prodromal symptoms. Continue suppression until delivery.    There are no diagnoses  linked to this encounter. Term labor symptoms and general obstetric precautions including but not limited to vaginal bleeding, contractions, leaking of fluid and fetal movement were reviewed in detail with the patient. Please refer to After Visit Summary for other counseling recommendations.  Return in about 1 week (around 02/10/2018) for routine PNC.  No future appointments.  De Hollingsheadatherine L , DO

## 2018-02-04 NOTE — L&D Delivery Note (Signed)
Delivery Note Ashley [name] is a 23 y.o. female G2P2002 with IUP at [redacted]w[redacted]d admitted for SOL .  She progressed without augmentation to complete and pushed less than 30 minutes to deliver.  Cord clamping delayed by several minutes then clamped by CNM and cut by FOB.  Placenta intact and spontaneous, bleeding minimal.  Second degree laceration repaired without difficulty.  Mom and baby stable prior to transfer to postpartum. She plans on breastfeeding. She requests Depo for birth control.   At 7:53 PM a viable and healthy female was delivered via Vaginal, Spontaneous (Presentation: LOA).  APGAR: 8, 9; weight 7 lb 0.7 oz (3195 g).    Placenta status: spontaneous, intact.  Cord: 3 vessel with the following complications:none.    Anesthesia:  IV pain meds Episiotomy: None Lacerations: 2nd degree Suture Repair: 3.0 vicryl rapide Est. Blood Loss (mL): 120  Mom to postpartum.  Baby to Couplet care / Skin to Skin.  Misty Stanley Leftwich-Kirby 02/19/2018, 10:20 PM

## 2018-02-07 ENCOUNTER — Inpatient Hospital Stay (HOSPITAL_COMMUNITY)
Admission: AD | Admit: 2018-02-07 | Discharge: 2018-02-07 | Disposition: A | Discharge status: Home / Self Care | Payer: Medicaid Other | Source: Ambulatory Visit | Attending: Obstetrics and Gynecology | Admitting: Obstetrics and Gynecology

## 2018-02-07 DIAGNOSIS — J069 Acute upper respiratory infection, unspecified: Secondary | ICD-10-CM

## 2018-02-07 DIAGNOSIS — R45851 Suicidal ideations: Secondary | ICD-10-CM | POA: Diagnosis not present

## 2018-02-07 DIAGNOSIS — F329 Major depressive disorder, single episode, unspecified: Secondary | ICD-10-CM | POA: Insufficient documentation

## 2018-02-07 DIAGNOSIS — O26893 Other specified pregnancy related conditions, third trimester: Secondary | ICD-10-CM

## 2018-02-07 DIAGNOSIS — M542 Cervicalgia: Secondary | ICD-10-CM | POA: Diagnosis present

## 2018-02-07 DIAGNOSIS — Z3A38 38 weeks gestation of pregnancy: Secondary | ICD-10-CM | POA: Insufficient documentation

## 2018-02-07 DIAGNOSIS — H9202 Otalgia, left ear: Secondary | ICD-10-CM

## 2018-02-07 DIAGNOSIS — O99513 Diseases of the respiratory system complicating pregnancy, third trimester: Secondary | ICD-10-CM | POA: Insufficient documentation

## 2018-02-07 DIAGNOSIS — O99343 Other mental disorders complicating pregnancy, third trimester: Secondary | ICD-10-CM | POA: Insufficient documentation

## 2018-02-07 DIAGNOSIS — Z880 Allergy status to penicillin: Secondary | ICD-10-CM | POA: Diagnosis not present

## 2018-02-07 LAB — INFLUENZA PANEL BY PCR (TYPE A & B)
Influenza A By PCR: NEGATIVE
Influenza B By PCR: NEGATIVE

## 2018-02-07 NOTE — MAU Provider Note (Signed)
History     CSN: [id number]  Arrival date and time: 02/07/18 2703   First Provider Initiated Contact with Patient 02/07/18 0715      Chief Complaint  Patient presents with  . Neck Pain  . Otalgia  . Nasal Congestion   Ashley [name] is a 23 y.o. G2P1 at [redacted]w[redacted]d who presents to MAU with complaints of nasal congestion/cough and ear pain. She reports symptoms started occurring 2 weeks ago as a sore throat and cough, has spread to include nasal passages. She reports symptoms are associated with left ear pain that radiates down her neck. She denies drainage or hearing loss with ear pain. Has been taken Robitussin 2 times yesterday with some relief of symptoms. Denies fever or chills. Denies pregnancy related concerns, no abdominal pain or vaginal bleeding/discharge. +FM.   OB History    Gravida  2   Para  1   Term  1   Preterm      AB      Living  1     SAB      TAB      Ectopic      Multiple      Live Births  1           Past Medical History:  Diagnosis Date  . Medical history non-contributory     Past Surgical History:  Procedure Laterality Date  . NO PAST SURGERIES      Family History  Problem Relation Age of Onset  . Diabetes Mother   . Hypertension Mother     Social History   Tobacco Use  . Smoking status: Never Smoker  . Smokeless tobacco: Never Used  Substance Use Topics  . Alcohol use: No  . Drug use: No    Allergies:  Allergies  Allergen Reactions  . Penicillins Hives and Rash    Foamed from mouth Has patient had a PCN reaction causing immediate rash, facial/tongue/throat swelling, SOB or lightheadedness with hypotension: Yes Has patient had a PCN reaction causing severe rash involving mucus membranes or skin necrosis: No Has patient had a PCN reaction that required hospitalization No Has patient had a PCN reaction occurring within the last 10 years: No If all of the above answers are "NO", then may proceed with Cephalosporin use.      Medications Prior to Admission  Medication Sig Dispense Refill Last Dose  . ferrous sulfate 325 (65 FE) MG tablet Take 1 tablet (325 mg total) by mouth 2 (two) times daily with a meal. 60 tablet 5 Taking  . Prenatal Vit-Fe Phos-FA-Omega (VITAFOL GUMMIES) 3.33-0.333-34.8 MG CHEW Chew 3 each by mouth daily. 90 tablet 11 Taking  . valACYclovir (VALTREX) 500 MG tablet Take 1 tablet (500 mg total) by mouth 2 (two) times daily. 60 tablet 1 Taking    Review of Systems  Constitutional: Negative for chills and fever.  HENT: Positive for congestion, ear pain and sinus pain.   Respiratory: Positive for cough.   Cardiovascular: Negative.   Gastrointestinal: Negative.   Genitourinary: Negative.   Musculoskeletal: Positive for neck pain.   Physical Exam   Blood pressure 125/79, pulse 87, temperature (!) 97.4 F (36.3 C), resp. rate 18, height 5\' 8"  (1.727 m), weight 68.5 kg, last menstrual period 05/17/2017, SpO2 100 %.  Physical Exam  Nursing note and vitals reviewed. Constitutional: She is oriented to person, place, and time. She appears well-developed and well-nourished. No distress.  HENT:  Right Ear: Hearing, tympanic membrane, external ear and  ear canal normal. No drainage or swelling. Tympanic membrane is not bulging.  Left Ear: Hearing, tympanic membrane, external ear and ear canal normal. No drainage or swelling. Tympanic membrane is not bulging.  Cardiovascular: Normal rate, regular rhythm and normal heart sounds.  Respiratory: Effort normal and breath sounds normal. No respiratory distress. She has no wheezes. She has no rales.  GI: Soft.  Gravid, appropriate for gestational age  Neurological: She is alert and oriented to person, place, and time.   FHR 161 by doppler  MAU Course  Procedures  MDM Orders Placed This Encounter  Procedures  . Influenza panel by PCR (type A & B)   Influenza swab pending, will call patient with results if positive and management accordingly.  Educated and discussed safe medication during pregnancy and different medications she can take for cough/congestion, list given. Discussed reasons to return to MAU. Follow up on Tuesday if symptoms are worsening. Pt stable at time of discharge.   Assessment and Plan   1. URI with cough and congestion   2. Depression with suicidal ideation   3. Left ear pain    Discharge home  Follow up as scheduled for prenatal appointments  Return to MAU as needed for emergencies or labor symptoms  Labor precautions and fetal kick counts  NST reactive  Discussed same medication during pregnancy for cough and cold  Influenza pending, will call with positive results   Follow-up Information    [facility] Follow up.   Specialty:  Obstetrics and Gynecology Contact information: [address] [place] [address] [phone]          Sharyon Cable CNM 02/07/2018, 7:42 AM

## 2018-02-07 NOTE — MAU Note (Signed)
Neck pain on left side that is sharp and aching. Sometimes causes ear pain.  Movement does not make a difference -just random pain. Sometimes has shooting pain in L buttocks that goes down leg. Nasal congestion for several days.

## 2018-02-07 NOTE — Progress Notes (Signed)
Steward Drone CNM aware of pt's admission and status. Will see pt in Triage. Pt does not have to be on EFM per CNM

## 2018-02-13 ENCOUNTER — Ambulatory Visit (INDEPENDENT_AMBULATORY_CARE_PROVIDER_SITE_OTHER): Payer: Medicaid Other | Admitting: Medical

## 2018-02-13 ENCOUNTER — Encounter: Payer: Self-pay | Admitting: Medical

## 2018-02-13 VITALS — BP 133/82 | HR 93 | Wt 149.0 lb

## 2018-02-13 DIAGNOSIS — Z3A38 38 weeks gestation of pregnancy: Secondary | ICD-10-CM

## 2018-02-13 DIAGNOSIS — B009 Herpesviral infection, unspecified: Secondary | ICD-10-CM

## 2018-02-13 DIAGNOSIS — Z3480 Encounter for supervision of other normal pregnancy, unspecified trimester: Secondary | ICD-10-CM

## 2018-02-13 DIAGNOSIS — Z3483 Encounter for supervision of other normal pregnancy, third trimester: Secondary | ICD-10-CM

## 2018-02-13 NOTE — Patient Instructions (Signed)
Fetal Movement Counts Patient Name: ________________________________________________ Patient Due Date: ____________________ What is a fetal movement count?  A fetal movement count is the number of times that you feel your baby move during a certain amount of time. This may also be called a fetal kick count. A fetal movement count is recommended for every pregnant woman. You may be asked to start counting fetal movements as early as week 28 of your pregnancy. Pay attention to when your baby is most active. You may notice your baby's sleep and wake cycles. You may also notice things that make your baby move more. You should do a fetal movement count:  When your baby is normally most active.  At the same time each day. A good time to count movements is while you are resting, after having something to eat and drink. How do I count fetal movements? 1. Find a quiet, comfortable area. Sit, or lie down on your side. 2. Write down the date, the start time and stop time, and the number of movements that you felt between those two times. Take this information with you to your health care visits. 3. For 2 hours, count kicks, flutters, swishes, rolls, and jabs. You should feel at least 10 movements during 2 hours. 4. You may stop counting after you have felt 10 movements. 5. If you do not feel 10 movements in 2 hours, have something to eat and drink. Then, keep resting and counting for 1 hour. If you feel at least 4 movements during that hour, you may stop counting. Contact a health care provider if:  You feel fewer than 4 movements in 2 hours.  Your baby is not moving like he or she usually does. Date: ____________ Start time: ____________ Stop time: ____________ Movements: ____________ Date: ____________ Start time: ____________ Stop time: ____________ Movements: ____________ Date: ____________ Start time: ____________ Stop time: ____________ Movements: ____________ Date: ____________ Start time:  ____________ Stop time: ____________ Movements: ____________ Date: ____________ Start time: ____________ Stop time: ____________ Movements: ____________ Date: ____________ Start time: ____________ Stop time: ____________ Movements: ____________ Date: ____________ Start time: ____________ Stop time: ____________ Movements: ____________ Date: ____________ Start time: ____________ Stop time: ____________ Movements: ____________ Date: ____________ Start time: ____________ Stop time: ____________ Movements: ____________ This information is not intended to replace advice given to you by your health care provider. Make sure you discuss any questions you have with your health care provider. Document Released: 02/20/2006 Document Revised: 09/20/2015 Document Reviewed: 03/02/2015 Elsevier Interactive Patient Education  2019 [employer] Braxton Hicks Contractions Contractions of the uterus can occur throughout pregnancy, but they are not always a sign that you are in labor. You may have practice contractions called Braxton Hicks contractions. These false labor contractions are sometimes confused with true labor. What are Braxton Hicks contractions? Braxton Hicks contractions are tightening movements that occur in the muscles of the uterus before labor. Unlike true labor contractions, these contractions do not result in opening (dilation) and thinning of the cervix. Toward the end of pregnancy (32-34 weeks), Braxton Hicks contractions can happen more often and may become stronger. These contractions are sometimes difficult to tell apart from true labor because they can be very uncomfortable. You should not feel embarrassed if you go to the hospital with false labor. Sometimes, the only way to tell if you are in true labor is for your health care provider to look for changes in the cervix. The health care provider will do a physical exam and may monitor your contractions. If   you are not in true labor, the exam  should show that your cervix is not dilating and your water has not broken. If there are no other health problems associated with your pregnancy, it is completely safe for you to be sent home with false labor. You may continue to have Braxton Hicks contractions until you go into true labor. How to tell the difference between true labor and false labor True labor  Contractions last 30-70 seconds.  Contractions become very regular.  Discomfort is usually felt in the top of the uterus, and it spreads to the lower abdomen and low back.  Contractions do not go away with walking.  Contractions usually become more intense and increase in frequency.  The cervix dilates and gets thinner. False labor  Contractions are usually shorter and not as strong as true labor contractions.  Contractions are usually irregular.  Contractions are often felt in the front of the lower abdomen and in the groin.  Contractions may go away when you walk around or change positions while lying down.  Contractions get weaker and are shorter-lasting as time goes on.  The cervix usually does not dilate or become thin. Follow these instructions at home:   Take over-the-counter and prescription medicines only as told by your health care provider.  Keep up with your usual exercises and follow other instructions from your health care provider.  Eat and drink lightly if you think you are going into labor.  If Braxton Hicks contractions are making you uncomfortable: ? Change your position from lying down or resting to walking, or change from walking to resting. ? Sit and rest in a tub of warm water. ? Drink enough fluid to keep your urine pale yellow. Dehydration may cause these contractions. ? Do slow and deep breathing several times an hour.  Keep all follow-up prenatal visits as told by your health care provider. This is important. Contact a health care provider if:  You have a fever.  You have continuous  pain in your abdomen. Get help right away if:  Your contractions become stronger, more regular, and closer together.  You have fluid leaking or gushing from your vagina.  You pass blood-tinged mucus (bloody show).  You have bleeding from your vagina.  You have low back pain that you never had before.  You feel your baby's head pushing down and causing pelvic pressure.  Your baby is not moving inside you as much as it used to. Summary  Contractions that occur before labor are called Braxton Hicks contractions, false labor, or practice contractions.  Braxton Hicks contractions are usually shorter, weaker, farther apart, and less regular than true labor contractions. True labor contractions usually become progressively stronger and regular, and they become more frequent.  Manage discomfort from Braxton Hicks contractions by changing position, resting in a warm bath, drinking plenty of water, or practicing deep breathing. This information is not intended to replace advice given to you by your health care provider. Make sure you discuss any questions you have with your health care provider. Document Released: 06/06/2016 Document Revised: 11/05/2016 Document Reviewed: 06/06/2016 Elsevier Interactive Patient Education  2019 Elsevier Inc.  

## 2018-02-13 NOTE — Progress Notes (Signed)
GBS Negative  Pt request cervix check  Pt states she notes blurry vision.  Denies any headaches.no swelling.

## 2018-02-13 NOTE — Progress Notes (Signed)
   PRENATAL VISIT NOTE  Subjective:  Ashley [name] is a 23 y.o. G2P1001 at [redacted]w[redacted]d being seen today for ongoing prenatal care.  She is currently monitored for the following issues for this high-risk pregnancy and has Supervision of normal pregnancy, antepartum; Concussion without loss of consciousness; Vitamin D deficiency; HSV-2 infection; UTI in pregnancy, antepartum, first trimester; Depression with suicidal ideation; and Severe major depression, single episode, without psychotic features (HCC) on their problem list.  Patient reports occasional contractions.  Contractions: Irritability. Vag. Bleeding: None.  Movement: Present. Denies leaking of fluid.   The following portions of the patient's history were reviewed and updated as appropriate: allergies, current medications, past family history, past medical history, past social history, past surgical history and problem list. Problem list updated.  Objective:   Vitals:   02/13/18 1117 02/13/18 1130  BP: 136/90 133/82  Pulse: (!) 101 93  Weight: 149 lb (67.6 kg)     Fetal Status: Fetal Heart Rate (bpm): 150 Fundal Height: 34 cm Movement: Present  Presentation: Vertex  General:  Alert, oriented and cooperative. Patient is in no acute distress.  Skin: Skin is warm and dry. No rash noted.   Cardiovascular: Normal heart rate noted  Respiratory: Normal respiratory effort, no problems with respiration noted  Abdomen: Soft, gravid, appropriate for gestational age.  Pain/Pressure: Present     Pelvic: Cervical exam performed Dilation: 2 Effacement (%): 80 Station: -1  Extremities: Normal range of motion.  Edema: None  Mental Status: Normal mood and affect. Normal behavior. Normal judgment and thought content.   Assessment and Plan:  Pregnancy: G2P1001 at [redacted]w[redacted]d  1. Supervision of other normal pregnancy, antepartum - Doing well, initial BP borderline, normal on repeat, no headache or floaters today  - Discussed warning signs for HTN in  pregnancy and reason to present to MAU  2. HSV-2 infection - Suppressive therapy   Term labor symptoms and general obstetric precautions including but not limited to vaginal bleeding, contractions, leaking of fluid and fetal movement were reviewed in detail with the patient. Please refer to After Visit Summary for other counseling recommendations.  Return in about 1 week (around 02/20/2018) for LOB.  Future Appointments  Date Time Provider Department Center  02/20/2018 10:45 AM Calvert Cantor, CNM [facility] None    Vonzella Nipple, [place]

## 2018-02-19 ENCOUNTER — Other Ambulatory Visit: Payer: Self-pay

## 2018-02-19 ENCOUNTER — Inpatient Hospital Stay (HOSPITAL_COMMUNITY): Payer: Medicaid Other | Admitting: Anesthesiology

## 2018-02-19 ENCOUNTER — Inpatient Hospital Stay (HOSPITAL_COMMUNITY)
Admission: AD | Admit: 2018-02-19 | Discharge: 2018-02-21 | DRG: 807 | Disposition: A | Discharge status: Home / Self Care | Payer: Medicaid Other | Attending: Obstetrics & Gynecology | Admitting: Obstetrics & Gynecology

## 2018-02-19 ENCOUNTER — Encounter (HOSPITAL_COMMUNITY): Payer: Self-pay | Admitting: *Deleted

## 2018-02-19 DIAGNOSIS — Z3A39 39 weeks gestation of pregnancy: Secondary | ICD-10-CM

## 2018-02-19 DIAGNOSIS — F329 Major depressive disorder, single episode, unspecified: Secondary | ICD-10-CM

## 2018-02-19 DIAGNOSIS — A6000 Herpesviral infection of urogenital system, unspecified: Secondary | ICD-10-CM | POA: Diagnosis present

## 2018-02-19 DIAGNOSIS — R45851 Suicidal ideations: Secondary | ICD-10-CM

## 2018-02-19 DIAGNOSIS — B009 Herpesviral infection, unspecified: Secondary | ICD-10-CM | POA: Diagnosis present

## 2018-02-19 DIAGNOSIS — Z880 Allergy status to penicillin: Secondary | ICD-10-CM

## 2018-02-19 DIAGNOSIS — F322 Major depressive disorder, single episode, severe without psychotic features: Secondary | ICD-10-CM | POA: Diagnosis present

## 2018-02-19 DIAGNOSIS — O9832 Other infections with a predominantly sexual mode of transmission complicating childbirth: Principal | ICD-10-CM | POA: Diagnosis present

## 2018-02-19 LAB — CBC
HCT: 32.1 % — ABNORMAL LOW (ref 36.0–46.0)
Hemoglobin: 10.2 g/dL — ABNORMAL LOW (ref 12.0–15.0)
MCH: 29.5 pg (ref 26.0–34.0)
MCHC: 31.8 g/dL (ref 30.0–36.0)
MCV: 92.8 fL (ref 80.0–100.0)
Platelets: 230 10*3/uL (ref 150–400)
RBC: 3.46 MIL/uL — ABNORMAL LOW (ref 3.87–5.11)
RDW: 13.7 % (ref 11.5–15.5)
WBC: 10.8 10*3/uL — ABNORMAL HIGH (ref 4.0–10.5)
nRBC: 0 % (ref 0.0–0.2)

## 2018-02-19 LAB — TYPE AND SCREEN
ABO/RH(D): A POS
Antibody Screen: NEGATIVE

## 2018-02-19 MED ORDER — LACTATED RINGERS IV SOLN: 500.0000 mL | INTRAVENOUS | Status: DC | PRN

## 2018-02-19 MED ORDER — ONDANSETRON HCL 4 MG PO TABS: 4.0000 mg | ORAL_TABLET | ORAL | Status: DC | PRN

## 2018-02-19 MED ORDER — DIBUCAINE 1 % RE OINT: 1.0000 "application " | TOPICAL_OINTMENT | RECTAL | Status: DC | PRN

## 2018-02-19 MED ORDER — LIDOCAINE HCL (PF) 1 % IJ SOLN
30.0000 mL | INTRAMUSCULAR | Status: DC | PRN
  Administered 2018-02-19: 30 mL via SUBCUTANEOUS
  Filled 2018-02-19: qty 30

## 2018-02-19 MED ORDER — ONDANSETRON HCL 4 MG/2ML IJ SOLN: 4.0000 mg | Freq: Four times a day (QID) | INTRAMUSCULAR | Status: DC | PRN

## 2018-02-19 MED ORDER — DIPHENHYDRAMINE HCL 25 MG PO CAPS: 25.0000 mg | ORAL_CAPSULE | Freq: Four times a day (QID) | ORAL | Status: DC | PRN

## 2018-02-19 MED ORDER — PHENYLEPHRINE 40 MCG/ML (10ML) SYRINGE FOR IV PUSH (FOR BLOOD PRESSURE SUPPORT)
80.0000 ug | PREFILLED_SYRINGE | INTRAVENOUS | Status: DC | PRN
  Filled 2018-02-19: qty 10

## 2018-02-19 MED ORDER — ZOLPIDEM TARTRATE 5 MG PO TABS: 5.0000 mg | ORAL_TABLET | Freq: Every evening | ORAL | Status: DC | PRN

## 2018-02-19 MED ORDER — PHENYLEPHRINE 40 MCG/ML (10ML) SYRINGE FOR IV PUSH (FOR BLOOD PRESSURE SUPPORT)
80.0000 ug | PREFILLED_SYRINGE | INTRAVENOUS | Status: DC | PRN
  Filled 2018-02-19 (×2): qty 10

## 2018-02-19 MED ORDER — MEASLES, MUMPS & RUBELLA VAC IJ SOLR: 0.5000 mL | Freq: Once | INTRAMUSCULAR | Status: DC

## 2018-02-19 MED ORDER — ACETAMINOPHEN 325 MG PO TABS: 650.0000 mg | ORAL_TABLET | ORAL | Status: DC | PRN

## 2018-02-19 MED ORDER — FENTANYL 2.5 MCG/ML BUPIVACAINE 1/10 % EPIDURAL INFUSION (WH - ANES)
14.0000 mL/h | INTRAMUSCULAR | Status: DC | PRN
  Administered 2018-02-19: 14 mL/h via EPIDURAL
  Filled 2018-02-19: qty 100

## 2018-02-19 MED ORDER — IBUPROFEN 600 MG PO TABS
600.0000 mg | ORAL_TABLET | Freq: Four times a day (QID) | ORAL | Status: DC
  Administered 2018-02-19 – 2018-02-21 (×7): 600 mg via ORAL
  Filled 2018-02-19 (×7): qty 1

## 2018-02-19 MED ORDER — OXYTOCIN BOLUS FROM INFUSION
500.0000 mL | Freq: Once | INTRAVENOUS | Status: AC
  Administered 2018-02-19: 500 mL via INTRAVENOUS

## 2018-02-19 MED ORDER — ONDANSETRON HCL 4 MG/2ML IJ SOLN: 4.0000 mg | INTRAMUSCULAR | Status: DC | PRN

## 2018-02-19 MED ORDER — TETANUS-DIPHTH-ACELL PERTUSSIS 5-2.5-18.5 LF-MCG/0.5 IM SUSP: 0.5000 mL | Freq: Once | INTRAMUSCULAR | Status: DC

## 2018-02-19 MED ORDER — SIMETHICONE 80 MG PO CHEW: 80.0000 mg | CHEWABLE_TABLET | ORAL | Status: DC | PRN

## 2018-02-19 MED ORDER — LACTATED RINGERS IV SOLN: 500.0000 mL | Freq: Once | INTRAVENOUS | Status: DC

## 2018-02-19 MED ORDER — OXYCODONE-ACETAMINOPHEN 5-325 MG PO TABS: 2.0000 | ORAL_TABLET | ORAL | Status: DC | PRN

## 2018-02-19 MED ORDER — OXYTOCIN 40 UNITS IN NORMAL SALINE INFUSION - SIMPLE MED
2.5000 [IU]/h | INTRAVENOUS | Status: DC
  Filled 2018-02-19: qty 1000

## 2018-02-19 MED ORDER — SOD CITRATE-CITRIC ACID 500-334 MG/5ML PO SOLN: 30.0000 mL | ORAL | Status: DC | PRN

## 2018-02-19 MED ORDER — LACTATED RINGERS IV SOLN
INTRAVENOUS | Status: DC
  Administered 2018-02-19 (×2): via INTRAVENOUS

## 2018-02-19 MED ORDER — EPHEDRINE 5 MG/ML INJ
10.0000 mg | INTRAVENOUS | Status: DC | PRN
  Filled 2018-02-19: qty 2

## 2018-02-19 MED ORDER — PRENATAL MULTIVITAMIN CH
1.0000 | ORAL_TABLET | Freq: Every day | ORAL | Status: DC
  Administered 2018-02-20 – 2018-02-21 (×2): 1 via ORAL
  Filled 2018-02-19 (×2): qty 1

## 2018-02-19 MED ORDER — FENTANYL CITRATE (PF) 100 MCG/2ML IJ SOLN
100.0000 ug | INTRAMUSCULAR | Status: DC | PRN
  Administered 2018-02-19 (×2): 100 ug via INTRAVENOUS
  Filled 2018-02-19 (×2): qty 2

## 2018-02-19 MED ORDER — COCONUT OIL OIL
1.0000 "application " | TOPICAL_OIL | Status: DC | PRN
  Administered 2018-02-20: 1 via TOPICAL
  Filled 2018-02-19: qty 120

## 2018-02-19 MED ORDER — SENNOSIDES-DOCUSATE SODIUM 8.6-50 MG PO TABS
2.0000 | ORAL_TABLET | ORAL | Status: DC
  Administered 2018-02-19 – 2018-02-20 (×2): 2 via ORAL
  Filled 2018-02-19 (×2): qty 2

## 2018-02-19 MED ORDER — ACETAMINOPHEN 325 MG PO TABS
650.0000 mg | ORAL_TABLET | ORAL | Status: DC | PRN
  Administered 2018-02-19 – 2018-02-20 (×2): 650 mg via ORAL
  Filled 2018-02-19 (×2): qty 2

## 2018-02-19 MED ORDER — DIPHENHYDRAMINE HCL 50 MG/ML IJ SOLN: 12.5000 mg | INTRAMUSCULAR | Status: DC | PRN

## 2018-02-19 MED ORDER — WITCH HAZEL-GLYCERIN EX PADS: 1.0000 "application " | MEDICATED_PAD | CUTANEOUS | Status: DC | PRN

## 2018-02-19 MED ORDER — OXYCODONE-ACETAMINOPHEN 5-325 MG PO TABS: 1.0000 | ORAL_TABLET | ORAL | Status: DC | PRN

## 2018-02-19 MED ORDER — LIDOCAINE HCL (PF) 1 % IJ SOLN
INTRAMUSCULAR | Status: DC | PRN
  Administered 2018-02-19 (×2): 4 mL via EPIDURAL

## 2018-02-19 MED ORDER — BENZOCAINE-MENTHOL 20-0.5 % EX AERO
1.0000 "application " | INHALATION_SPRAY | CUTANEOUS | Status: DC | PRN
  Administered 2018-02-19: 1 via TOPICAL
  Filled 2018-02-19: qty 56

## 2018-02-19 NOTE — Progress Notes (Signed)
Pt informed she may ambulate.  Instructed to remain on the 1st floor and ambulate until 1620.  Also instructed to return sooner ROM or VB.  Pt verbalized understanding & agreeable.

## 2018-02-19 NOTE — H&P (Signed)
Ashley [name] is a 23 y.o. female G2P1001 at [redacted]w[redacted]d presenting for active labor at term  Pregnancy is complicated by HSV 2 on Valtrex. She denies any symptoms of outbreak.   Nursing Staff Provider  Office Location  [facility] Dating   LMP  Language  [name]  Anatomy US   normal female  Flu Vaccine   declined 09/29/17 Genetic Screen  NIPS:low risk   AFP: negative   TDaP vaccine   declined 01/21/18 Hgb A1C or  GTT Third trimester 2 hour wnl  Rhogam     LAB RESULTS   Feeding Plan Breast  Blood Type A/Positive/-- (07/02 1017)   Contraception DEPO Antibody Negative (07/02 1017)  Circumcision Yes if a boy  Rubella 6.33 (07/02 1017)  Pediatrician  Dr.[facility]  RPR Non Reactive (10/21 1135)   Support Person Phillip (FOB)  HBsAg Negative (07/02 1017)   Prenatal Classes NO  HIV Non Reactive (10/21 1135)  BTL Consent  GBS  (For PCN allergy, check sensitivities) NEG 01/21/18  VBAC Consent  Pap  05/26/17- normal    Hgb Electro      CF  wnl    SMA  wbnl    Waterbirth  [ ]  Class [ ]  Consent [ ]  CNM visit   OB History    Gravida  2   Para  1   Term  1   Preterm      AB      Living  1     SAB      TAB      Ectopic      Multiple      Live Births  1          Past Medical History:  Diagnosis Date  . Medical history non-contributory    Past Surgical History:  Procedure Laterality Date  . NO PAST SURGERIES     Family History: family history includes Diabetes in her mother; Hypertension in her mother. Social History:  reports that she has never smoked. She has never used smokeless tobacco. She reports that she does not drink alcohol or use drugs.     Maternal Diabetes: No Genetic Screening: Normal Maternal Ultrasounds/Referrals: Normal Fetal Ultrasounds or other Referrals:  None Maternal Substance Abuse:  No Significant Maternal Medications:  Meds include: Other:  Significant Maternal Lab Results:  Lab values include: Group B Strep negative Other  Comments:  Valtrex for HSV  Review of Systems  Constitutional: Negative for chills and fever.  Respiratory: Negative for shortness of breath.   Cardiovascular: Negative for chest pain.  Gastrointestinal: Positive for abdominal pain. Negative for constipation, diarrhea, nausea and vomiting.  Neurological: Negative for dizziness and headaches.  All other systems reviewed and are negative.  Maternal Medical History:  Reason for admission: Contractions.  Nausea.  Contractions: Onset was 3-5 hours ago.   Frequency: regular.   Perceived severity is strong.    Fetal activity: Perceived fetal activity is normal.   Last perceived fetal movement was within the past hour.    Prenatal complications: no prenatal complications Prenatal Complications - Diabetes: none.    Dilation: 5 Effacement (%): 80 Station: -2 Exam by:: F. Morris, RNC Blood pressure 122/76, pulse 73, temperature (!) 97.5 F (36.4 C), temperature source Oral, resp. rate 18, height 5\' 8"  (1.727 m), weight 66.2 kg, last menstrual period 05/17/2017, SpO2 100 %. Maternal Exam:  Uterine Assessment: Contraction strength is moderate.  Contraction frequency is regular.   Abdomen: Fetal presentation: vertex  Pelvis: adequate for delivery.   Cervix: Cervix evaluated by digital exam.     Fetal Exam Fetal Monitor Review: Mode: ultrasound.   Baseline rate: 135.  Variability: moderate (6-25 bpm).   Pattern: accelerations present and no decelerations.    Fetal State Assessment: Category I - tracings are normal.     Physical Exam  Nursing note and vitals reviewed. Constitutional: She is oriented to person, place, and time. She appears well-developed and well-nourished.  Neck: Normal range of motion.  Cardiovascular: Normal rate and regular rhythm.  Respiratory: Effort normal and breath sounds normal.  GI: Soft.  Musculoskeletal: Normal range of motion.  Neurological: She is alert and oriented to person, place, and  time.  Skin: Skin is warm and dry.  Psychiatric: She has a normal mood and affect. Her behavior is normal. Judgment and thought content normal.    Prenatal labs: ABO, Rh: A/Positive/-- (07/02 1017) Antibody: Negative (07/02 1017) Rubella: 6.33 (07/02 1017) RPR: Non Reactive (10/21 1135)  HBsAg: Negative (07/02 1017)  HIV: Non Reactive (10/21 1135)  GBS:   Negative  Assessment/Plan: G2P1001 @[redacted]w[redacted]d  admitted for active labor at term GBS negative HSV, on Valtrex, no active lesions  Admit to [facility] Expectant management IV pain medications, may have epidural when desired Anticipate NSVD  Sharen Counter 02/19/2018, 4:58 PM

## 2018-02-19 NOTE — Anesthesia Procedure Notes (Signed)
Epidural Patient location during procedure: OB Start time: 02/19/2018 7:14 PM End time: 02/19/2018 7:16 PM  Staffing Anesthesiologist: Kaylyn Layer, MD Performed: anesthesiologist   Preanesthetic Checklist Completed: patient identified, pre-op evaluation, timeout performed, IV checked, risks and benefits discussed and monitors and equipment checked  Epidural Patient position: sitting Prep: site prepped and draped and DuraPrep Patient monitoring: continuous pulse ox, blood pressure, heart rate and cardiac monitor Approach: midline Location: L3-L4 Injection technique: LOR air  Needle:  Needle type: Tuohy  Needle gauge: 17 G Needle length: 9 cm Needle insertion depth: 5 cm Catheter type: closed end flexible Catheter size: 19 Gauge Catheter at skin depth: 10 cm Test dose: negative and Other (1% lidocaine)  Assessment Events: blood not aspirated, injection not painful, no injection resistance, negative IV test and no paresthesia  Additional Notes Patient identified. Risks, benefits, and alternatives discussed with patient including but not limited to bleeding, infection, nerve damage, paralysis, failed block, incomplete pain control, headache, blood pressure changes, nausea, vomiting, reactions to medication, itching, and postpartum back pain. Confirmed with bedside nurse the patient's most recent platelet count. Confirmed with patient that they are not currently taking any anticoagulation, have any bleeding history, or any family history of bleeding disorders. Patient expressed understanding and wished to proceed. All questions were answered. Sterile technique was used throughout the entire procedure. Please see nursing notes for vital signs. Crisp LOR on first pass. Test dose was given through epidural catheter and negative prior to continuing to dose epidural or start infusion. Warning signs of high block given to the patient including shortness of breath, tingling/numbness in  hands, complete motor block, or any concerning symptoms with instructions to call for help. Patient was given instructions on fall risk and not to get out of bed. All questions and concerns addressed with instructions to call with any issues or inadequate analgesia.  Reason for block:procedure for pain

## 2018-02-19 NOTE — Discharge Summary (Signed)
Postpartum Discharge Summary     Patient Name: Ashley [name] DOB: 1995/06/10 MRN: [id number]  Date of admission: 02/19/2018 Delivering Provider: Sharen Counter A   Date of discharge: 02/21/2018  Admitting diagnosis: 39WKS, CTX Intrauterine pregnancy: [redacted]w[redacted]d     Secondary diagnosis:  Principal Problem:   Normal labor Active Problems:   HSV-2 infection   Severe major depression, single episode, without psychotic features (HCC)   Second degree laceration of perineum, delivered, current hospitalization  Additional problems: as above     Discharge diagnosis: Term Pregnancy Delivered                                                                                                Post partum procedures:none  Augmentation: n/a  Complications: None  Hospital course:  Onset of Labor With Vaginal Delivery     23 y.o. yo [id number] at [redacted]w[redacted]d was admitted in Active Labor on 02/19/2018. Patient had an uncomplicated labor course as follows:  Membrane Rupture Time/Date: 6:48 PM ,02/19/2018   Intrapartum Procedures: Episiotomy: None [1]                                         Lacerations:  2nd degree [3]  Patient had a delivery of a Viable infant. 02/19/2018  Information for the patient's newborn:  Ashley, [name] [[id number]]  Delivery Method: Vaginal, Spontaneous(Filed from Delivery Summary)    [name] had an uncomplicated postpartum course.  She is ambulating, tolerating a regular diet, passing flatus, and urinating well. Patient is discharged home in stable condition on 02/21/18.   Magnesium Sulfate recieved: No BMZ received: No  Physical exam  Vitals:   02/20/18 0430 02/20/18 1500 02/20/18 2140 02/21/18 0554  BP: 111/70 103/65 (!) 105/57 102/62  Pulse: 63 74 67 61  Resp: 16 16 18 18   Temp: 98.2 F (36.8 C)  98.4 F (36.9 C) 97.8 F (36.6 C)  TempSrc: Oral  Oral Oral  SpO2:  98%    Weight:      Height:       General: alert, cooperative and no distress Lochia:  appropriate Uterine Fundus: firm Incision: N/A DVT Evaluation: No evidence of DVT seen on physical exam. Labs: Lab Results  Component Value Date   WBC 10.8 (H) 02/19/2018   HGB 10.2 (L) 02/19/2018   HCT 32.1 (L) 02/19/2018   MCV 92.8 02/19/2018   PLT 230 02/19/2018   CMP Latest Ref Rng & Units 09/19/2015  Glucose 65 - 99 mg/dL 77  BUN 6 - 20 mg/dL 7  Creatinine 0.25 - 4.27 mg/dL 0.62  Sodium 376 - 283 mmol/L 135  Potassium 3.5 - 5.1 mmol/L 3.6  Chloride 101 - 111 mmol/L 108  CO2 22 - 32 mmol/L 20(L)  Calcium 8.9 - 10.3 mg/dL 9.0  Total Protein 6.5 - 8.1 g/dL 6.7  Total Bilirubin 0.3 - 1.2 mg/dL 0.4  Alkaline Phos 38 - 126 U/L 105  AST 15 - 41 U/L 22  ALT 14 - 54 U/L 18  Discharge instruction: per After Visit Summary and "Baby and Me Booklet".  After visit meds:  Allergies as of 02/21/2018      Reactions   Penicillins Hives, Rash   Foamed from mouth Has patient had a PCN reaction causing immediate rash, facial/tongue/throat swelling, SOB or lightheadedness with hypotension: Yes Has patient had a PCN reaction causing severe rash involving mucus membranes or skin necrosis: No Has patient had a PCN reaction that required hospitalization No Has patient had a PCN reaction occurring within the last 10 years: No If all of the above answers are "NO", then may proceed with Cephalosporin use.      Medication List    TAKE these medications   ferrous sulfate 325 (65 FE) MG tablet Take 1 tablet (325 mg total) by mouth 2 (two) times daily with a meal.   valACYclovir 500 MG tablet Commonly known as:  VALTREX Take 1 tablet (500 mg total) by mouth 2 (two) times daily.   VITAFOL GUMMIES 3.33-0.333-34.8 MG Chew Chew 3 each by mouth daily.       Diet: routine diet  Activity: Advance as tolerated. Pelvic rest for 6 weeks.   Outpatient follow up:6 weeks with behaviorial health visit in 1-2 weeks Follow up Appt: Future Appointments  Date Time Provider Department Center   03/03/2018  1:00 PM [facility]  03/19/2018  1:30 PM Sharyon Cable, CNM [facility] None   Follow up Visit: Follow-up Information    [facility] Follow up in 4 week(s).   Specialty:  Obstetrics and Gynecology Contact information: [address] [place] [address] [phone]           Please schedule this patient for Postpartum visit in: 6 weeks with the following provider: Any provider For C/S patients schedule nurse incision check in weeks 2 weeks: no Low risk pregnancy complicated by: depression Delivery mode:  SVD Anticipated Birth Control:  Depo PP Procedures needed: n/a  Schedule Integrated BH visit: yes      Newborn Data: Live born female  Birth Weight: 7 lb 0.7 oz (3195 g) APGAR: 8, 9  Newborn Delivery   Birth date/time:  02/19/2018 19:53:00 Delivery type:  Vaginal, Spontaneous     Baby Feeding: Breast Disposition:home with mother   02/21/2018 Ted Mcalpine, MD

## 2018-02-19 NOTE — MAU Note (Signed)
Urine in lab 

## 2018-02-19 NOTE — MAU Note (Signed)
Pt reports contractions, denies bleeding or ROM.  

## 2018-02-19 NOTE — Anesthesia Preprocedure Evaluation (Signed)
Anesthesia Evaluation  Patient identified by MRN, date of birth, ID band Patient awake    Reviewed: Allergy & Precautions, Patient's Chart, lab work & pertinent test results  History of Anesthesia Complications Negative for: history of anesthetic complications  Airway Mallampati: II  TM Distance: >3 FB Neck ROM: Full    Dental  (+) Teeth Intact   Pulmonary neg pulmonary ROS,    Pulmonary exam normal breath sounds clear to auscultation       Cardiovascular negative cardio ROS Normal cardiovascular exam Rhythm:Regular Rate:Normal     Neuro/Psych Depression negative neurological ROS     GI/Hepatic negative GI ROS, Neg liver ROS,   Endo/Other  negative endocrine ROS  Renal/GU negative Renal ROS     Musculoskeletal negative musculoskeletal ROS (+)   Abdominal   Peds  Hematology negative hematology ROS (+)   Anesthesia Other Findings Day of surgery medications reviewed with the patient.  Reproductive/Obstetrics (+) Pregnancy                             Anesthesia Physical Anesthesia Plan  ASA: II  Anesthesia Plan: Epidural   Post-op Pain Management:    Induction:   PONV Risk Score and Plan: Treatment may vary due to age or medical condition  Airway Management Planned: Natural Airway  Additional Equipment:   Intra-op Plan:   Post-operative Plan:   Informed Consent: I have reviewed the patients History and Physical, chart, labs and discussed the procedure including the risks, benefits and alternatives for the proposed anesthesia with the patient or authorized representative who has indicated his/her understanding and acceptance.       Plan Discussed with: CRNA  Anesthesia Plan Comments:         Anesthesia Quick Evaluation

## 2018-02-19 NOTE — Anesthesia Pain Management Evaluation Note (Signed)
  CRNA Pain Management Visit Note  Patient: Ashley [name], 23 y.o., female  "Hello I am a member of the anesthesia team at [facility]. We have an anesthesia team available at all times to provide care throughout the hospital, including epidural management and anesthesia for C-section. I don't know your plan for the delivery whether it a natural birth, water birth, IV sedation, nitrous supplementation, doula or epidural, but we want to meet your pain goals."   1.Was your pain managed to your expectations on prior hospitalizations?   Yes   2.What is your expectation for pain management during this hospitalization?     Epidural  3.How can we help you reach that goal? The patient just received IV pain medication but still wishes to have an epidural soon.  Record the patient's initial score and the patient's pain goal.   Pain: 5  Pain Goal: 5 [facility] wants you to be able to say your pain was always managed very well.  Sajjad Honea 02/19/2018

## 2018-02-20 ENCOUNTER — Encounter (HOSPITAL_COMMUNITY): Payer: Self-pay

## 2018-02-20 ENCOUNTER — Encounter: Payer: Self-pay | Admitting: Advanced Practice Midwife

## 2018-02-20 LAB — RPR: RPR Ser Ql: NONREACTIVE

## 2018-02-20 NOTE — Discharge Instructions (Signed)
Taking care of yourself after Baby arrives… °1. Vaginal Bleeding: Vaginal bleeding is common after delivery, with the amount decreasing gradually over 1-2 weeks. If the bleeding increases, is mixed with pus, or is foul-smelling, call your doctor, as this may be a sign of infection. ° °2. Abdominal Pain: Abdominal cramping after delivery is common, especially when you breastfeed. The same hormones responsible for letting milk down to your nipple also contract your uterus. If the pain worsens, or occurs more frequently over 48 hrs after delivery, call your doctor. ° °3. Fevers: After delivery you are at increased risk of developing an infection. If you have a fever, increased vaginal bleeding, foul-smelling vaginal discharge, or increased abdominal pain, call your doctor. ° °4. Breast Feeding: Feeding every 1.5-3 hours keeps Baby satisfied and your milk in good supply. If 3 hours have gone by and Baby is sleeping, wake him/her up to feed. Nurse for 15-20 minutes on one breast before offering the other. Breast-fed babies often lose up to 7% of their birth weight in the first few days of life, but should start gaining about an ounce per day after 4 days.  ° °5. Mastitis (Breast infection): Breaks in the skin or bacteria passing into your breast ducts can cause an infection. If you notice a triangular shaped area on your breast that is red, warm to the touch and tender, call your doctor. It is safe for Baby and helps you to heal faster if you keep breast feeding through this infection. ° °6. Postpartum Depression: Postpartum depression is very common after a woman delivers because of all the hormonal changes happening in her body. If you notice that you start to feel more sad or anxious than usual or have any thoughts of hurting yourself or Baby, tell someone right away. ° °If you have any questions or concerns, please call your doctor.  ° °

## 2018-02-20 NOTE — Clinical Social Work Maternal (Signed)
CLINICAL SOCIAL WORK MATERNAL/CHILD NOTE  Patient Details  Name: [name] [name] MRN: [id number] Date of Birth: 11/23/1995  Date:  02/20/2018  Clinical Social Worker Initiating Note:  Izzy Doubek Boyd-Gilyard Date/Time: Initiated:  02/20/18/1325     Child's Name:  [name] [name]   Biological Parents:  Mother(FOB is [name] [name] 08/08/1994)   Need for Interpreter:  None   Reason for Referral:  Behavioral Health Concerns   Address:  [address] [place] [address]    Phone number:  [phone] (home)     Additional phone number:   Household Members/Support Persons (HM/SP):   Household Member/Support Person 1(MOB reported MOB resides with her mother and father. )   HM/SP Name Relationship DOB or Age  HM/SP -1 [name] [name] son 09/20/2015  HM/SP -2        HM/SP -3        HM/SP -4        HM/SP -5        HM/SP -6        HM/SP -7        HM/SP -8          Natural Supports (not living in the home):  Parent, Immediate Family, Extended Family   Professional Supports: None(CSW offered MOB resources for counseling and MOB declined. )   Employment: Unemployed   Type of Work:     Education:  College graduate   Homebound arranged:    Financial Resources:  Medicaid   Other Resources:  WIC   Cultural/Religious Considerations Which May Impact Care:  Per MOB's Face Sheet, MOB is [employer].   Strengths:  Ability to meet basic needs , Home prepared for child , Pediatrician chosen   Psychotropic Medications:         Pediatrician:    [place] (including [place] and surronding areas)  Pediatrician List:   [place]    [place]    [place]    [place]    [place] [facility]  [place]      Pediatrician Fax Number:    Risk Factors/Current Problems:  Mental Health Concerns    Cognitive State:  Alert , Able to Concentrate , Insightful , Linear Thinking , Goal Oriented    Mood/Affect:  Interested , Happy ,  Relaxed , Comfortable    CSW Assessment: CSW met with MOB in room 119 to complete an assessment for MH hx. When CSW arrived, MOB was resting in bed, MOB's friend was holding infant, and FOB was watching on  TV.  With MOB's permission, CSW asked FOB and MOB's friend to leave the room to meet MOB in private.  MOB was polite, easy to engage, and receptive to meeting with CSW.  CSW asked about MOB's MH health and MOB openly shared that MOB was recently dx with depression (11/2017). MOB reported, "I spent a few nights in a inpatient unit and was discharged with Zoloft.  However, the Zoloft made me sick and I stopped taking it.    MOB reported that MOB's symptoms have been minium and she has been coping by utilizing the strategies and interventions she learned while she was inpatient. CSW offered resources for outpatient counseling and MOB's declined.   CSW provided education regarding the baby blues period vs. perinatal mood disorders, discussed treatment and gave resources for mental health follow up if concerns arise.  CSW recommends self-evaluation during the postpartum time period using the New Mom Checklist from Postpartum Progress and encouraged MOB to contact   a medical professional if symptoms are noted at any time. CSW presented with insight and awareness and communicated having a good support team.  CSW assessed for safety and MOB denied SI, HI, and DV.    CSW Plan/Description:  No Further Intervention Required/No Barriers to Discharge, Sudden Infant Death Syndrome (SIDS) Education, Perinatal Mood and Anxiety Disorder (PMADs) Education, Other Patient/Family Education, Other Information/Referral to [employer], MSW, [employer] Social Work [phone]  Dimple Nanas, LCSW 06/05/18, 3:32 PM

## 2018-02-20 NOTE — Anesthesia Postprocedure Evaluation (Signed)
Anesthesia Post Note  Patient: Ashley [name]  Procedure(s) Performed: AN AD HOC LABOR EPIDURAL     Patient location during evaluation: Mother Baby Anesthesia Type: Epidural Level of consciousness: awake and alert Pain management: pain level controlled Vital Signs Assessment: post-procedure vital signs reviewed and stable Respiratory status: spontaneous breathing, nonlabored ventilation and respiratory function stable Cardiovascular status: stable Postop Assessment: no headache, no backache, epidural receding and patient able to bend at knees Anesthetic complications: no    Last Vitals:  Vitals:   02/19/18 2310 02/20/18 0430  BP: 129/72 111/70  Pulse: 78 63  Resp: 20 16  Temp: 36.8 C 36.8 C  SpO2:      Last Pain:  Vitals:   02/20/18 0430  TempSrc: Oral  PainSc: 0-No pain   Pain Goal:                @ANFLOW60MIN (12500)  )[facility] Records

## 2018-02-20 NOTE — Progress Notes (Addendum)
Post Partum Day #1 following SVD.  Subjective: no complaints, up ad lib, voiding, tolerating PO and + flatus. Patient reports that pain is well controlled with Ibuprofen, vaginal bleeding has improved, and she is experiencing no dizziness with ambulation.   Objective: Blood pressure 111/70, pulse 63, temperature 98.2 F (36.8 C), temperature source Oral, resp. rate 16, height 5\' 8"  (1.727 m), weight 66.2 kg, last menstrual period 05/17/2017, SpO2 98 %, unknown if currently breastfeeding.  Physical Exam:  General: alert, cooperative and no distress Lochia: appropriate Uterine Fundus: firm Incision: n/a DVT Evaluation: No evidence of DVT seen on physical exam.  Recent Labs    02/19/18 1645  HGB 10.2*  HCT 32.1*    Assessment/Plan: Plan for discharge tomorrow, Breastfeeding and Contraception Depo-provera  History of depression/suicidal ideation: discontinued Zoloft in October with no noted depressive symptoms since. Declines desire to resume zoloft, discussed PPD warning signs with patient and her family today, SW consult pending.  HSV: Continue Valtrex ppx   LOS: 1 day   Melanie Mermiges 02/20/2018, 9:18 AM   I have seen this patient and agree with the resident's documentation. I have examined them separately, and we have discussed the plan of care.  Kennice Finnie L. Jill Side, MD OB/GYN Fellow

## 2018-02-21 ENCOUNTER — Ambulatory Visit: Payer: Self-pay

## 2018-02-21 NOTE — Lactation Note (Signed)
This note was copied from a baby's chart. Lactation Consultation Note  Patient Name: Ashley [name] [id number] Date: 02/21/2018   Follow up visit.  Infant staying as a baby patient.  Went to see mom to talk about using DEBP to possibly help stimulate milk production and maybe get something to feed back to infant.  Mom reports she tried one time to pump and didn't get anything.  Mom reports she plans to just give some formula past breastfeeds. Offered to assist with pumping.  Mom declined at this time.  Urged her to follow up with lactation as needed.    Maternal Data    Feeding Feeding Type: Breast Fed  [facility] Score                   Interventions    Lactation Tools Discussed/Used     Consult Status      Ashley [name] 02/21/2018, 7:54 PM

## 2018-02-21 NOTE — Lactation Note (Addendum)
This note was copied from a baby's chart. Lactation Consultation Note  Patient Name: Boy Nardia Gajewski OILNZ'[id number] Date: 02/21/2018   Baby boy Phineas Semen now 2 hours old with 3 stools and 3 voids.  Mom reports he started nursing better during the night and started doing some cluster feeding.  Infant latched to left breast on arrival.  Falling off nipple/mom reports got sleepy.  Reviewed how to know when infant gets enough.  Reviewed adequate voids/stools and body language to know infant content past bf.  Urged mom to hand express and do some spoon feeding past bf.  Urged mom to hand express and spoon feed back all expressed  breastmilk past breastfeeds. Since infant asleep at breast urged her to move him  To other breast. minimal assist with latch. Urged her to get him in a little closer and move blankets away so he can get a better latch and more in his mouth.  Infant latched and breastfed well. Showed mom how to do breast compressions to help keep him awake and get more milk while feeding.  Audible swallows heard.Mom has cone breastfeeding resources sheet for going home.Mom has pediatricians follow up appt on Monday.    Maternal Data    Feeding    LATCH Score                   Interventions    Lactation Tools Discussed/Used     Consult Status      Lynzee Lindquist Michaelle Copas 02/21/2018, 9:33 AM

## 2018-02-22 ENCOUNTER — Ambulatory Visit: Payer: Self-pay

## 2018-02-22 NOTE — Lactation Note (Signed)
This note was copied from a baby's chart. Lactation Consultation Note  Patient Name: Ashley [name] [id number] Date: 02/22/2018 Reason for consult: Follow-up assessment;Infant weight loss;Term(ready for D/C -  7% weight loss )  Per mom feeling much better about latching and breast feeding since her milk is coming in.  LC reviewed sore nipple and engorgement prevention and tx. Mom has a hand pump to go with am the DEBP kit . She Mentioned she has DEBP at home / not sure of the name.  LC discussed nutritive vs non - nutritive sucking pattern, and the importance of watching the baby for hanging out latched.  LC stressed the importance of STS feedings until the baby can stay awake for feeding/ back to birth weight and gaining steadily.  Storage of  Breast milk.  Mother informed of post-discharge support and given phone number to the lactation department, including services for phone call assistance; out-patient appointments; and breastfeeding support group. List of other breastfeeding resources in the community given in the handout. Encouraged mother to call for problems or concerns related to breastfeeding.   Maternal Data    Feeding Feeding Type: Breast Fed  LATCH Score                   Interventions Interventions: Breast feeding basics reviewed  Lactation Tools Discussed/Used Pump Review: Milk Storage   Consult Status Consult Status: Complete Date: 02/22/18    Myer Haff 02/22/2018, 11:36 AM

## 2018-02-23 ENCOUNTER — Inpatient Hospital Stay (HOSPITAL_COMMUNITY)
Admission: AD | Admit: 2018-02-23 | Discharge: 2018-02-23 | Disposition: A | Discharge status: Home / Self Care | Payer: Medicaid Other | Attending: Obstetrics & Gynecology | Admitting: Obstetrics & Gynecology

## 2018-02-23 ENCOUNTER — Encounter (HOSPITAL_COMMUNITY): Payer: Self-pay

## 2018-02-23 DIAGNOSIS — O9279 Other disorders of lactation: Secondary | ICD-10-CM | POA: Diagnosis not present

## 2018-02-23 DIAGNOSIS — Z880 Allergy status to penicillin: Secondary | ICD-10-CM | POA: Diagnosis not present

## 2018-02-23 DIAGNOSIS — N644 Mastodynia: Secondary | ICD-10-CM | POA: Diagnosis present

## 2018-02-23 NOTE — MAU Provider Note (Signed)
History     CSN: [id number]  Arrival date and time: 02/23/18 0120   First Provider Initiated Contact with Patient 02/23/18 0144      Chief Complaint  Patient presents with  . Breast Pain   HPI Ashley [name] is a 23 y.o. [id number] postpartum from a vaginal delivery on 1/16 who presents with breast pain. She states her milk came in today and she has had breast pain since then. She states she is latching the baby and pumping with minimal relief. She states she is done breastfeeding because the pain is too much.  OB History    Gravida  2   Para  2   Term  2   Preterm      AB      Living  2     SAB      TAB      Ectopic      Multiple  0   Live Births  2           Past Medical History:  Diagnosis Date  . Medical history non-contributory     Past Surgical History:  Procedure Laterality Date  . NO PAST SURGERIES      Family History  Problem Relation Age of Onset  . Diabetes Mother   . Hypertension Mother     Social History   Tobacco Use  . Smoking status: Never Smoker  . Smokeless tobacco: Never Used  Substance Use Topics  . Alcohol use: No  . Drug use: No    Allergies:  Allergies  Allergen Reactions  . Penicillins Hives and Rash    Foamed from mouth Has patient had a PCN reaction causing immediate rash, facial/tongue/throat swelling, SOB or lightheadedness with hypotension: Yes Has patient had a PCN reaction causing severe rash involving mucus membranes or skin necrosis: No Has patient had a PCN reaction that required hospitalization No Has patient had a PCN reaction occurring within the last 10 years: No If all of the above answers are "NO", then may proceed with Cephalosporin use.     Medications Prior to Admission  Medication Sig Dispense Refill Last Dose  . ferrous sulfate 325 (65 FE) MG tablet Take 1 tablet (325 mg total) by mouth 2 (two) times daily with a meal. 60 tablet 5 Past Week at Unknown time  . Prenatal Vit-Fe Phos-FA-Omega  (VITAFOL GUMMIES) 3.33-0.333-34.8 MG CHEW Chew 3 each by mouth daily. 90 tablet 11 Past Week at Unknown time  . valACYclovir (VALTREX) 500 MG tablet Take 1 tablet (500 mg total) by mouth 2 (two) times daily. 60 tablet 1 Past Week at Unknown time    Review of Systems  Constitutional: Negative.  Negative for fatigue and fever.  HENT: Negative.   Respiratory: Negative.  Negative for shortness of breath.   Cardiovascular: Negative.  Negative for chest pain.  Gastrointestinal: Negative.  Negative for abdominal pain, constipation, diarrhea, nausea and vomiting.  Genitourinary: Negative.  Negative for dysuria.  Neurological: Negative.  Negative for dizziness and headaches.   Physical Exam   Blood pressure 130/81, pulse 87, temperature 98.3 F (36.8 C), temperature source Oral, height 5\' 8"  (1.727 m), weight 63 kg, SpO2 100 %, unknown if currently breastfeeding.  Physical Exam  Nursing note and vitals reviewed. Constitutional: She is oriented to person, place, and time. She appears well-developed and well-nourished. No distress.  HENT:  Head: Normocephalic.  Eyes: Pupils are equal, round, and reactive to light.  Cardiovascular: Normal rate, regular rhythm and normal  heart sounds.  Respiratory: Effort normal and breath sounds normal. No respiratory distress.  Both breasts swollen. No redness or streaking  GI: Soft. Bowel sounds are normal. She exhibits no distension. There is no abdominal tenderness.  Neurological: She is alert and oriented to person, place, and time.  Skin: Skin is warm and dry.  Psychiatric: She has a normal mood and affect. Her behavior is normal. Judgment and thought content normal.    MAU Course  Procedures  MDM Low suspicion for mastitis at this time. Patient afebrile and no breast changes consistent with mastitis.  Lengthy discussion with patient about breastfeeding vs formula feeding. Patient desires to stop breast feeding. Methods of drying up breast milk  reviewed at length.  Assessment and Plan   1. Breast engorgement, postpartum    -Discharge home in stable condition -Mastitis precautions discussed -Patient advised to follow-up with [facility] this week if condition doesn't improve -Patient may return to MAU as needed or if her condition were to change or worsen   Rolm BookbinderCaroline M Keawe Marcello CNM 02/23/2018, 1:44 AM

## 2018-02-23 NOTE — MAU Note (Signed)
Pt here with c/o breast pain on both sides, clogged ducts and blood coming out when pumping. Delivered vaginally on 02/19/18.

## 2018-02-23 NOTE — Discharge Instructions (Signed)
Breast Engorgement  Breast engorgement is the overfilling of your breasts with breast milk. It is usually caused by delaying feedings, which can cause milk to build up.  Breast engorgement can happen at any time while you are breast feeding, and is normal in the first 3-5 days after giving birth. The condition can make your breasts feel heavy, full, hard, tightly stretched, warm, and tender. Breast engorgement should improve within 24-48 hours of feeding your baby or expressing your milk.  Follow these instructions at home:  When to breastfeed or pump   Breastfeed when your baby shows signs of hunger. This is called "breastfeeding on demand."   Breastfeed or use a breast pump to remove milk from your breasts when you feel the need to reduce the fullness of your breasts.   If your baby is younger than 1 month, make sure you are breastfeeding every 1-3 hours during the day. You may need to wake up your baby to feed if he or she is asleep at a feeding time.   Do not allow your baby to sleep longer than 5 hours during the night without a feeding.   Do not delay feedings.   If you are returning to work or are away from home for an extended period, try to pump your milk on the same schedule as when your baby would breastfeed.  Before breastfeeding or pumping:   Increase the circulation in your breasts and help your milk flow. Try either of these methods:  ? Taking a warm shower.  ? Applying warm, water-soaked hand towels to your breasts.  ? Massaging your breasts.   Pump or hand-express breast milk before breastfeeding to soften your breast, areola, and nipple.  During breastfeeding or pumping:   Try to relax when it is time to feed your baby. This helps to trigger your "let-down reflex," which releases milk from your breast.   Ensure your baby is latched on to your breast and positioned properly while breastfeeding.   Empty your breasts completely when breastfeeding or pumping.   Allow your baby to remain at  your breast as long as he or she is latched on well and sucking. Your baby will let you know when he or she is done breastfeeding by pulling away from your breast or falling asleep.   Massage your breasts to help your milk flow.  Managing pain and swelling     Take over-the-counter and prescription medicines only as told by your health care provider.   If directed, put ice on your breasts:  ? Put ice in a plastic bag.  ? Place a towel between your skin and the bag.  ? Leave the ice on for 20 minutes, 2-3 times a day.   If you feel pain while breastfeeding, take your baby off your breast and try again.  General instructions   After breastfeeding or pumping wear a snug bra or tank top for 1-2 days. This will signal your body to slightly decrease how much milk it makes. Once the engorgement passes, make sure you to wear a well-fitted, supportive bra and regular clothes.   Drink enough fluid to keep your urine clear or pale yellow.   Avoid introducing bottles or pacifiers to your baby in the early weeks of breastfeeding. Wait to introduce these things until after resolving any breastfeeding challenges.  Contact a health care provider if:   Engorgement lasts longer than 2 days, even after treatment.   You have flu-like symptoms, such as   a fever, chills, or body aches.   You have nausea or you vomit.   Your breasts become red and painful.   You have a lump in your breast.   Your nipples continue to crack or start to ooze.   There is yellow discharge coming from a nipple.   You have pain while breastfeeding, and it does not go away once you take your baby off your breast and try again.  Get help right away if:   There is pus or blood in your breast milk.   You have sudden, severe symptoms.   You have red streaks near your breast.   Both breasts appear infected and you cannot breastfeed.  Summary   Breast engorgement is the overfilling of your breasts with breast milk. It is usually caused by delayed  feeding.   Although it is normal to experience breast engorgement 3-5 days after giving birth, it can happen at any time while breastfeeding.   Do not delay feedings. Breastfeed on demand to help prevent engorgement.   Increase the circulation in your breasts and help your milk flow before feeding your baby. You can do this by taking a warm shower, applying warm water-soaked hand towels, or massaging your breasts.  This information is not intended to replace advice given to you by your health care provider. Make sure you discuss any questions you have with your health care provider.  Document Released: 05/18/2004 Document Revised: 02/26/2016 Document Reviewed: 02/26/2016  Elsevier Interactive Patient Education  2019 Elsevier Inc.

## 2018-03-03 ENCOUNTER — Ambulatory Visit (INDEPENDENT_AMBULATORY_CARE_PROVIDER_SITE_OTHER): Payer: Medicaid Other | Admitting: Clinical

## 2018-03-03 DIAGNOSIS — F3341 Major depressive disorder, recurrent, in partial remission: Secondary | ICD-10-CM | POA: Diagnosis not present

## 2018-03-03 NOTE — BH Specialist Note (Signed)
Integrated Behavioral Health Follow Up Visit  MRN: [id number] Name: Ashley [name]  Number of Integrated Behavioral Health Clinician visits: 3/6 Session Start time: 1:32 Session End time: 2:08 Total time: 30 minutes  Type of Service: Integrated Behavioral Health- Individual/Family Interpretor:No. Interpretor Name and Language: n/a  SUBJECTIVE: Ashley [name] is a 23 y.o. female accompanied by n/a Patient was referred by Scheryl Darter, MD for mood check postpartum. Patient reports the following symptoms/concerns: Pt states she feels she is doing well postpartum, is no longer taking any BH medication, is using self-coping strategies daily, is sleeping well, and feels well-supported by FOB, family and friends.Pt felt bad about not breastfeeding longer, but has "worked through" and feels better now.  Mild issue with lack of appetite, and fatigue.  Duration of problem: Postpartum; Severity of problem: moderate  OBJECTIVE: Mood: Normal and Affect: Appropriate Risk of harm to self or others: No plan to harm self or others  LIFE CONTEXT: Family and Social: Pt lives with with newborn and FOB School/Work: Maternity leave Self-Care: Self-coping strategies daily Life Changes: Recent childbirth; breastfeeding difficulty resolved to formula only  GOALS ADDRESSED: Patient will: 1.  Increase knowledge and/or ability of: healthy habits  2.  Demonstrate ability to: Increase healthy adjustment to current life circumstances and Increase adequate support systems for patient/family  INTERVENTIONS: Interventions utilized:  Supportive Counseling and Psychoeducation and/or Health Education Standardized Assessments completed: GAD-7 and PHQ 9  ASSESSMENT: Patient currently experiencing Major depressive disorder, in partial remission.   Patient may benefit from continued brief therapeutic interventions today.  PLAN: 1. Follow up with behavioral health clinician on : As needed 2. Behavioral  recommendations:  -Return to [facility], if symptoms of depression and anxiety return -Continue using daily self-coping strategies  -Prioritize healthy sleep daily until postpartum appointment(continue to sleep when baby sleeps) -Set timer on phone to go off twice daily, for meal reminders(and take prenatal vitamin at one timer daily); continue through postpartum visit -Consider attending at least one MomTalk or Baby and Me class at [facility]; one PSI online support, prior to postpartum visit 3. Referral(s): Integrated Art gallery manager (In Clinic) and [employer]:  Mom Talk/PSI online support  4. "From scale of 1-10, how likely are you to follow plan?": 10  Rae Lips, LCSW  Depression screen [facility] 2/9 10/27/2017 10/13/2017 10/13/2017 10/04/2015  Decreased Interest 1 2 2 1   Down, Depressed, Hopeless 1 3 3 1   PHQ - 2 Score 2 5 5 2   Altered sleeping 2 3 3 2   Tired, decreased energy 2 2 2 1   Change in appetite 1 2 2 3   Feeling bad or failure about yourself  2 3 3  0  Trouble concentrating 2 1 2  0  Moving slowly or fidgety/restless 1 1 2  0  Suicidal thoughts 0 2 1 0  PHQ-9 Score 12 19 20 8   Difficult doing work/chores - - Somewhat difficult -   GAD 7 : Generalized Anxiety Score 10/27/2017 10/13/2017  Nervous, Anxious, on Edge 0 1  Control/stop worrying 1 2  Worry too much - different things 1 3  Trouble relaxing 1 2  Restless 1 3  Easily annoyed or irritable 2 2  Afraid - awful might happen 1 2  Total GAD 7 Score 7 15

## 2018-03-19 ENCOUNTER — Ambulatory Visit (INDEPENDENT_AMBULATORY_CARE_PROVIDER_SITE_OTHER): Payer: Medicaid Other | Admitting: Certified Nurse Midwife

## 2018-03-19 DIAGNOSIS — Z1389 Encounter for screening for other disorder: Secondary | ICD-10-CM | POA: Diagnosis not present

## 2018-03-19 DIAGNOSIS — Z3009 Encounter for other general counseling and advice on contraception: Secondary | ICD-10-CM

## 2018-03-19 NOTE — Progress Notes (Signed)
Post Partum Exam  Ashley [name] is a 23 y.o. [id number] female who presents for a postpartum visit. She is 4 weeks postpartum following a spontaneous vaginal delivery. I have fully reviewed the prenatal and intrapartum course. The delivery was at 39 gestational weeks.  Anesthesia: epidural. Postpartum course has been well. Baby's course has been well. Baby is feeding by bottle - [name]. Spotting  Bowel function is normal. Bladder function is normal. Patient is sexually active. Last intercourse on Tuesday 03/17/2018- unprotected, withdraw per patient. Contraception method is none. Postpartum depression screening:neg  The following portions of the patient's history were reviewed and updated as appropriate: allergies, current medications, past family history, past surgical history and problem list. Last pap smear done 05/26/2017 and was Normal  Review of Systems A comprehensive review of systems was negative.    Objective:  unknown if currently breastfeeding.  General:  alert, cooperative and no distress   Breasts:  negative  Lungs: clear to auscultation bilaterally  Heart:  regular rate and rhythm, S1, S2 normal, no murmur, click, rub or gallop  Abdomen: soft, non-tender; bowel sounds normal; no masses,  no organomegaly   Vulva:  not evaluated  Vagina: not evaluated  Cervix:  not evaluated  Corpus: not examined  Adnexa:  not evaluated  Rectal Exam: Not performed.        Assessment/Plan:  1. Postpartum care and examination  - Normal postpartum exam. Pap smear not done at today's visit. Last pap 05/2017 and was normal   2. Birth control counseling - Patient had unprotected IC 2 days ago, unable to initiate birth control today  - Educated and discussed birth control options in detail, patient does not want Depo anymore is considering LARCs (Nexplanon vs IUD)  - Detailed information packet given to patient about Nexplanon and IUD, plan to follow up in 2 weeks for insertion  - Patient  leaning towards Nexplanon  - Encouraged to abstain from IC until appointment, patient verbalizes understanding   Follow up in: 2 weeks for Nexplanon insertion or as needed.   Sharyon Cable, CNM 03/19/18, 2:29 PM

## 2018-04-02 ENCOUNTER — Encounter: Payer: Self-pay | Admitting: Certified Nurse Midwife

## 2018-04-02 ENCOUNTER — Ambulatory Visit (INDEPENDENT_AMBULATORY_CARE_PROVIDER_SITE_OTHER): Payer: Medicaid Other | Admitting: Certified Nurse Midwife

## 2018-04-02 VITALS — BP 128/88 | HR 90 | Wt 135.8 lb

## 2018-04-02 DIAGNOSIS — Z30017 Encounter for initial prescription of implantable subdermal contraceptive: Secondary | ICD-10-CM | POA: Diagnosis not present

## 2018-04-02 DIAGNOSIS — Z3202 Encounter for pregnancy test, result negative: Secondary | ICD-10-CM

## 2018-04-02 LAB — POCT URINE PREGNANCY: Preg Test, Ur: NEGATIVE

## 2018-04-02 MED ORDER — ETONOGESTREL 68 MG ~~LOC~~ IMPL
68.0000 mg | DRUG_IMPLANT | Freq: Once | SUBCUTANEOUS | Status: AC
  Administered 2018-04-02: 68 mg via SUBCUTANEOUS

## 2018-04-02 NOTE — Patient Instructions (Signed)
Nexplanon Instructions After Insertion   Keep bandage clean and dry for 24 hours   May use ice/Tylenol/Ibuprofen for soreness or pain   If you develop fever, drainage or increased warmth from incision site-contact office immediately Etonogestrel implant What is this medicine? ETONOGESTREL (et oh noe JES trel) is a contraceptive (birth control) device. It is used to prevent pregnancy. It can be used for up to 3 years. This medicine may be used for other purposes; ask your health care provider or pharmacist if you have questions. COMMON BRAND NAME(S): Implanon, Nexplanon What should I tell my health care provider before I take this medicine? They need to know if you have any of these conditions: -abnormal vaginal bleeding -blood vessel disease or blood clots -breast, cervical, endometrial, ovarian, liver, or uterine cancer -diabetes -gallbladder disease -heart disease or recent heart attack -high blood pressure -high cholesterol or triglycerides -kidney disease -liver disease -migraine headaches -seizures -stroke -tobacco smoker -an unusual or allergic reaction to etonogestrel, anesthetics or antiseptics, other medicines, foods, dyes, or preservatives -pregnant or trying to get pregnant -breast-feeding How should I use this medicine? This device is inserted just under the skin on the inner side of your upper arm by a health care professional. Talk to your pediatrician regarding the use of this medicine in children. Special care may be needed. Overdosage: If you think you have taken too much of this medicine contact a poison control center or emergency room at once. NOTE: This medicine is only for you. Do not share this medicine with others. What if I miss a dose? This does not apply. What may interact with this medicine? Do not take this medicine with any of the following medications: -amprenavir -fosamprenavir This medicine may also interact with the following  medications: -acitretin -aprepitant -armodafinil -bexarotene -bosentan -carbamazepine -certain medicines for fungal infections like fluconazole, ketoconazole, itraconazole and voriconazole -certain medicines to treat hepatitis, HIV or AIDS -cyclosporine -felbamate -griseofulvin -lamotrigine -modafinil -oxcarbazepine -phenobarbital -phenytoin -primidone -rifabutin -rifampin -rifapentine -St. John's wort -topiramate This list may not describe all possible interactions. Give your health care provider a list of all the medicines, herbs, non-prescription drugs, or dietary supplements you use. Also tell them if you smoke, drink alcohol, or use illegal drugs. Some items may interact with your medicine. What should I watch for while using this medicine? This product does not protect you against HIV infection (AIDS) or other sexually transmitted diseases. You should be able to feel the implant by pressing your fingertips over the skin where it was inserted. Contact your doctor if you cannot feel the implant, and use a non-hormonal birth control method (such as condoms) until your doctor confirms that the implant is in place. Contact your doctor if you think that the implant may have broken or become bent while in your arm. You will receive a user card from your health care provider after the implant is inserted. The card is a record of the location of the implant in your upper arm and when it should be removed. Keep this card with your health records. What side effects may I notice from receiving this medicine? Side effects that you should report to your doctor or health care professional as soon as possible: -allergic reactions like skin rash, itching or hives, swelling of the face, lips, or tongue -breast lumps, breast tissue changes, or discharge -breathing problems -changes in emotions or moods -if you feel that the implant may have broken or bent while in your arm -high blood    pressure -pain, irritation, swelling, or bruising at the insertion site -scar at site of insertion -signs of infection at the insertion site such as fever, and skin redness, pain or discharge -signs and symptoms of a blood clot such as breathing problems; changes in vision; chest pain; severe, sudden headache; pain, swelling, warmth in the leg; trouble speaking; sudden numbness or weakness of the face, arm or leg -signs and symptoms of liver injury like dark yellow or brown urine; general ill feeling or flu-like symptoms; light-colored stools; loss of appetite; nausea; right upper belly pain; unusually weak or tired; yellowing of the eyes or skin -unusual vaginal bleeding, discharge Side effects that usually do not require medical attention (report to your doctor or health care professional if they continue or are bothersome): -acne -breast pain or tenderness -headache -irregular menstrual bleeding -nausea This list may not describe all possible side effects. Call your doctor for medical advice about side effects. You may report side effects to FDA at 1-800-FDA-1088. Where should I keep my medicine? This drug is given in a hospital or clinic and will not be stored at home. NOTE: This sheet is a summary. It may not cover all possible information. If you have questions about this medicine, talk to your doctor, pharmacist, or health care provider.  2019 Elsevier/Gold Standard (2016-12-10 14:11:42)   

## 2018-04-02 NOTE — Progress Notes (Signed)
Pt is here for Nexplanon insertion

## 2018-04-02 NOTE — Progress Notes (Signed)
GYNECOLOGY CLINIC PROCEDURE NOTE  [name] is a 23 y.o. [id number] here for Nexplanon insertion. No GYN concerns.  Last pap smear was on 05/26/2017 and was normal.  No other gynecologic concerns.  Nexplanon Insertion Procedure Patient was given informed consent, she signed consent form.  Patient does understand that irregular bleeding is a very common side effect of this medication. She was advised to have backup contraception for one week after placement. Pregnancy test in clinic today was negative.  Appropriate time out taken.  Patient's left arm was prepped and draped in the usual sterile fashion.. The ruler used to measure and mark insertion area.  Patient was prepped with alcohol swab and then injected with 3 ml of 1% lidocaine.  She was prepped with betadine, Nexplanon removed from packaging,  Device confirmed in needle, then inserted full length of needle and withdrawn per handbook instructions. Nexplanon was able to palpated in the patient's arm; patient palpated the insert herself. There was minimal blood loss.  Patient insertion site covered with guaze and a pressure bandage to reduce any bruising.  The patient tolerated the procedure well and was given post procedure instructions.   Sharyon Cable, CNM 04/02/2018 11:13 AM

## 2018-08-28 ENCOUNTER — Telehealth: Payer: Self-pay

## 2018-08-28 DIAGNOSIS — N926 Irregular menstruation, unspecified: Secondary | ICD-10-CM

## 2018-08-28 DIAGNOSIS — N939 Abnormal uterine and vaginal bleeding, unspecified: Secondary | ICD-10-CM

## 2018-08-28 MED ORDER — NORETHINDRONE-ETH ESTRADIOL 1-35 MG-MCG PO TABS: 1.0000 | ORAL_TABLET | Freq: Every day | ORAL | 11 refills | Status: DC

## 2018-08-28 NOTE — Telephone Encounter (Signed)
Pt called stating that she has been bleeding everyday since February when she got her nexplanon placed all but about 2 days out of the month. On those days she states that she still spots. She states that she is bleeding is heavy enough to wear super tampons. Pt would like to know if there is something that can help stop her bleeding. She states that she is okay with keeping the nexplanon if her bleeding can be controlled. Discussed with Dr. Jodi Mourning who states to send the patient ortho novum. Rx sent to the pharmacy

## 2018-09-15 ENCOUNTER — Other Ambulatory Visit: Payer: Self-pay | Admitting: Obstetrics

## 2018-09-15 DIAGNOSIS — B009 Herpesviral infection, unspecified: Secondary | ICD-10-CM

## 2018-10-01 ENCOUNTER — Telehealth: Payer: Self-pay | Admitting: *Deleted

## 2018-10-01 NOTE — Telephone Encounter (Signed)
Pt called to office to ask about BC pills while on Nexplanon. Pt states she is taking pills to help with bleeding control. Pt states pills are working but as soon as she stops, bleeding continues.  Pt states that she does not feel very well while on pills, upset stomach just feels bad.  Reviewed with Dr Jodi Mourning.  Pt may d/c pills and monitor bleeding if she chooses.  If bleeding continues or does not stop, pt needs an appt.   Pt made aware and will call office as needed if bleeding continues.

## 2018-10-11 ENCOUNTER — Other Ambulatory Visit: Payer: Self-pay | Admitting: Obstetrics

## 2018-10-11 DIAGNOSIS — B009 Herpesviral infection, unspecified: Secondary | ICD-10-CM

## 2019-01-12 ENCOUNTER — Encounter: Payer: Self-pay | Admitting: Family Medicine

## 2019-01-12 ENCOUNTER — Ambulatory Visit (INDEPENDENT_AMBULATORY_CARE_PROVIDER_SITE_OTHER): Payer: BC Managed Care – PPO | Admitting: Family Medicine

## 2019-01-12 ENCOUNTER — Other Ambulatory Visit (HOSPITAL_COMMUNITY)
Admission: RE | Admit: 2019-01-12 | Discharge: 2019-01-12 | Disposition: A | Discharge status: Home / Self Care | Payer: BC Managed Care – PPO | Source: Ambulatory Visit | Attending: Family Medicine | Admitting: Family Medicine

## 2019-01-12 ENCOUNTER — Other Ambulatory Visit: Payer: Self-pay

## 2019-01-12 VITALS — BP 114/75 | HR 76 | Ht 68.0 in | Wt 142.0 lb

## 2019-01-12 DIAGNOSIS — R102 Pelvic and perineal pain: Secondary | ICD-10-CM | POA: Insufficient documentation

## 2019-01-12 LAB — POCT URINALYSIS DIPSTICK
Bilirubin, UA: NEGATIVE
Blood, UA: NEGATIVE
Glucose, UA: NEGATIVE
Leukocytes, UA: NEGATIVE
Nitrite, UA: NEGATIVE
Protein, UA: POSITIVE — AB
Spec Grav, UA: 1.02 (ref 1.010–1.025)
Urobilinogen, UA: 0.2 E.U./dL
pH, UA: 7 (ref 5.0–8.0)

## 2019-01-12 NOTE — Progress Notes (Signed)
GYN presents for intermittent pelvic pain that radiates to her back LQ . 2-9/10 x 38 days, nausea.  Denies urinary frequency, vomiting, chills, fever.

## 2019-01-12 NOTE — Progress Notes (Signed)
   Subjective:    Patient ID: Ashley [name], female    DOB: 02-05-1995, 23 y.o.   MRN: [id number]  HPI Patient seen for pelvic pain that started last month and has been persistent. Has nexplanon for contraception and was on OCPs to regulate cycle. Has not had a period since end of October. Pain started after that. Went to urgent care in [place] - had STD screen and was told she had infection. Was given Rocephin IM, Doxy x 7 days, flagyl x 7 days. Cultures were all negative. Pain has improved, but now still has some pain that radiates into back. Brown discharge intermittently. Radiation into L lower back.   Review of Systems     Objective:   Physical Exam Exam conducted with a chaperone present.  Cardiovascular:     Rate and Rhythm: Normal rate.     Pulses: Normal pulses.  Pulmonary:     Effort: Pulmonary effort is normal.  Abdominal:     General: Abdomen is flat. There is no distension.     Palpations: Abdomen is soft. There is no mass.     Tenderness: There is abdominal tenderness (LLQ). There is no guarding or rebound.     Hernia: No hernia is present. There is no hernia in the left inguinal area or right inguinal area.  Genitourinary:    Labia:        Right: No rash, tenderness, lesion or injury.        Left: No rash, tenderness, lesion or injury.      Vagina: No signs of injury and foreign body. Vaginal discharge (clumpy white discharge) present. No erythema, tenderness, bleeding or lesions.     Cervix: No cervical motion tenderness, discharge, friability, lesion, erythema, cervical bleeding or eversion.     Uterus: Not deviated, not enlarged, not fixed, not tender and no uterine prolapse.      Adnexa:        Right: No mass, tenderness or fullness.         Left: Tenderness and fullness present. No mass.    Lymphadenopathy:     Lower Body: No right inguinal adenopathy. No left inguinal adenopathy.       Assessment & Plan:  1. Pelvic pain ? Ovarian cyst. Unlikely to be  infection as cultures neg and has already been treated. Pt requests retesting anyway. Also could be from the nexplanon as some people with have cramping from the nexplanon, however this is more adnexal. UA negative for UTI.  - POCT Urinalysis Dipstick - Cervicovaginal ancillary only( ) - [place] [facility] Transvaginal; Future

## 2019-01-13 LAB — CERVICOVAGINAL ANCILLARY ONLY
Bacterial Vaginitis (gardnerella): NEGATIVE
Candida Glabrata: NEGATIVE
Candida Vaginitis: NEGATIVE
Chlamydia: NEGATIVE
Comment: NEGATIVE
Comment: NEGATIVE
Comment: NEGATIVE
Comment: NEGATIVE
Comment: NEGATIVE
Comment: NORMAL
Neisseria Gonorrhea: NEGATIVE
Trichomonas: NEGATIVE

## 2019-01-18 ENCOUNTER — Telehealth: Payer: Self-pay | Admitting: Obstetrics and Gynecology

## 2019-01-18 ENCOUNTER — Telehealth: Payer: Self-pay

## 2019-01-18 NOTE — Telephone Encounter (Signed)
Pt called saying she is scheduled for a transvaginal US tomorrow and she has statred her period.  She wants to know if she still should come.  CB#  [phone]

## 2019-01-18 NOTE — Telephone Encounter (Signed)
Pt called and stated that she started her period today and wants to know if she can still go to her [place] tomorrow.

## 2019-01-18 NOTE — Telephone Encounter (Signed)
Message received at [facility]. Informed patient that she would need to contact [place]. Transferred her to clinic once informed.

## 2019-01-19 ENCOUNTER — Other Ambulatory Visit: Payer: Self-pay

## 2019-01-19 ENCOUNTER — Ambulatory Visit (HOSPITAL_COMMUNITY)
Admission: RE | Admit: 2019-01-19 | Discharge: 2019-01-19 | Disposition: A | Discharge status: Home / Self Care | Payer: BC Managed Care – PPO | Source: Ambulatory Visit | Attending: Family Medicine | Admitting: Family Medicine

## 2019-01-19 DIAGNOSIS — R102 Pelvic and perineal pain: Secondary | ICD-10-CM

## 2019-01-23 MED ORDER — DICLOFENAC SODIUM 75 MG PO TBEC: 75.0000 mg | DELAYED_RELEASE_TABLET | Freq: Two times a day (BID) | ORAL | 2 refills | Status: DC

## 2019-01-23 NOTE — Addendum Note (Signed)
Addended by: Truett Mainland on: 01/23/2019 05:48 PM   Modules accepted: Orders

## 2019-03-19 ENCOUNTER — Telehealth: Payer: Self-pay | Admitting: Obstetrics

## 2019-03-19 DIAGNOSIS — B009 Herpesviral infection, unspecified: Secondary | ICD-10-CM

## 2019-03-19 MED ORDER — VALACYCLOVIR HCL 500 MG PO TABS: ORAL_TABLET | ORAL | 5 refills | Status: DC

## 2019-03-19 NOTE — Telephone Encounter (Signed)
Patient called requesting refill on her Valtrex.  Patient has been seen within the last year.  Refill routed to pharmacy.

## 2019-05-06 ENCOUNTER — Telehealth: Payer: Self-pay

## 2019-05-06 NOTE — Telephone Encounter (Signed)
Pt called and reports that she has the nexplanon and is currently also taking birth control pills to control her bleeding. Pt reports she has not had a cycle since January until she started on March 12th and bled until the 25th. Then pt reports she started bleeding agin on the 29th and is currently still bleeding. She reports on the days she bleeds it is fairly heavy, going through super plus tampons every 2 hours. Pt denies any other symptoms other than cramping. Pt would like to know what her doctor recommends.

## 2019-05-11 ENCOUNTER — Other Ambulatory Visit: Payer: Self-pay | Admitting: Obstetrics

## 2019-05-11 DIAGNOSIS — N939 Abnormal uterine and vaginal bleeding, unspecified: Secondary | ICD-10-CM

## 2019-05-11 MED ORDER — NORETHINDRONE-ETH ESTRADIOL 1-35 MG-MCG PO TABS: 1.0000 | ORAL_TABLET | Freq: Two times a day (BID) | ORAL | 0 refills | Status: DC

## 2019-05-11 NOTE — Telephone Encounter (Signed)
I called patient and discussed treatment options.  I increased the dosage of OCP's to 1 tab bid for 1 more pack

## 2019-05-28 ENCOUNTER — Other Ambulatory Visit: Payer: Self-pay

## 2019-05-28 ENCOUNTER — Ambulatory Visit (INDEPENDENT_AMBULATORY_CARE_PROVIDER_SITE_OTHER): Payer: BC Managed Care – PPO

## 2019-05-28 VITALS — BP 120/76 | HR 84 | Wt 146.4 lb

## 2019-05-28 DIAGNOSIS — R300 Dysuria: Secondary | ICD-10-CM | POA: Diagnosis not present

## 2019-05-28 LAB — POCT URINALYSIS DIPSTICK
Bilirubin, UA: NEGATIVE
Blood, UA: NEGATIVE
Glucose, UA: NEGATIVE
Ketones, UA: NEGATIVE
Leukocytes, UA: NEGATIVE
Nitrite, UA: NEGATIVE
Protein, UA: NEGATIVE
Spec Grav, UA: 1.015 (ref 1.010–1.025)
Urobilinogen, UA: 0.2 E.U./dL
pH, UA: 7.5 (ref 5.0–8.0)

## 2019-05-28 NOTE — Progress Notes (Signed)
Pt is here with complaints of urinary frequency. Pt denies any dysuria. Pt made aware that POCT urinalysis dipstick is clear but we will send off for urine culture and let her know the results. Advised pt that she could try OTC Azo products at the pharmacy to help with symptoms in the meantime, pt verbalizes understanding.

## 2019-05-30 ENCOUNTER — Other Ambulatory Visit: Payer: Self-pay | Admitting: Obstetrics

## 2019-05-30 DIAGNOSIS — N3000 Acute cystitis without hematuria: Secondary | ICD-10-CM

## 2019-05-30 LAB — URINE CULTURE

## 2019-05-30 MED ORDER — CLINDAMYCIN HCL 300 MG PO CAPS: 300.0000 mg | ORAL_CAPSULE | Freq: Three times a day (TID) | ORAL | 0 refills | Status: DC

## 2019-06-02 ENCOUNTER — Other Ambulatory Visit: Payer: Self-pay | Admitting: Obstetrics and Gynecology

## 2019-06-02 MED ORDER — CEPHALEXIN 500 MG PO CAPS: 500.0000 mg | ORAL_CAPSULE | Freq: Four times a day (QID) | ORAL | 2 refills | Status: DC

## 2019-06-10 ENCOUNTER — Ambulatory Visit (INDEPENDENT_AMBULATORY_CARE_PROVIDER_SITE_OTHER): Payer: BC Managed Care – PPO | Admitting: Obstetrics

## 2019-06-10 ENCOUNTER — Other Ambulatory Visit: Payer: Self-pay

## 2019-06-10 ENCOUNTER — Other Ambulatory Visit (HOSPITAL_COMMUNITY)
Admission: RE | Admit: 2019-06-10 | Discharge: 2019-06-10 | Disposition: A | Discharge status: Home / Self Care | Payer: BC Managed Care – PPO | Source: Ambulatory Visit | Attending: Obstetrics | Admitting: Obstetrics

## 2019-06-10 ENCOUNTER — Encounter: Payer: Self-pay | Admitting: Obstetrics

## 2019-06-10 VITALS — BP 116/75 | HR 73 | Wt 143.0 lb

## 2019-06-10 DIAGNOSIS — N898 Other specified noninflammatory disorders of vagina: Secondary | ICD-10-CM | POA: Diagnosis not present

## 2019-06-10 DIAGNOSIS — Z113 Encounter for screening for infections with a predominantly sexual mode of transmission: Secondary | ICD-10-CM | POA: Diagnosis not present

## 2019-06-10 DIAGNOSIS — Z01419 Encounter for gynecological examination (general) (routine) without abnormal findings: Secondary | ICD-10-CM | POA: Insufficient documentation

## 2019-06-10 NOTE — Progress Notes (Signed)
Subjective:        Ashley [name] is a 24 y.o. female here for a routine exam.  Current complaints: . Irregular vaginal bleeding.  Personal health questionnaire:  Is patient [name], have a family history of breast and/or ovarian cancer: no Is there a family history of uterine cancer diagnosed at age < 32, gastrointestinal cancer, urinary tract cancer, family member who is a Field seismologist syndrome-associated carrier: no Is the patient overweight and hypertensive, family history of diabetes, personal history of gestational diabetes, preeclampsia or PCOS: no Is patient over 61, have PCOS,  family history of premature CHD under age 26, diabetes, smoke, have hypertension or peripheral artery disease:  no At any time, has a partner hit, kicked or otherwise hurt or frightened you?: no Over the past 2 weeks, have you felt down, depressed or hopeless?: no Over the past 2 weeks, have you felt little interest or pleasure in doing things?:no   Gynecologic History No LMP recorded. Patient has had an implant. Contraception: Nexplanon Last Pap: 2019. Results were: normal Last mammogram: n/a. Results were: n/a  Obstetric History OB History  Gravida Para Term Preterm AB Living  2 2 2     2   SAB TAB Ectopic Multiple Live Births        0 2    # Outcome Date GA Lbr Len/2nd Weight Sex Delivery Anes PTL Lv  2 Term 02/19/18 [redacted]w[redacted]d 13:43 / 00:10 7 lb 0.7 oz (3.195 kg) M Vag-Spont Local, EPI  LIV  1 Term 09/20/15 [redacted]w[redacted]d  5 lb 13.1 oz (2.639 kg) M Vag-Spont EPI  LIV    Past Medical History:  Diagnosis Date  . Medical history non-contributory     Past Surgical History:  Procedure Laterality Date  . NO PAST SURGERIES       Current Outpatient Medications:  .  diclofenac (VOLTAREN) 75 MG EC tablet, Take 1 tablet (75 mg total) by mouth 2 (two) times daily with a meal. (Patient not taking: Reported on 06/10/2019), Disp: 30 tablet, Rfl: 2 .  Prenatal Vit-Fe Phos-FA-Omega (VITAFOL GUMMIES)  3.33-0.333-34.8 MG CHEW, Chew 3 each by mouth daily. (Patient not taking: Reported on 03/19/2018), Disp: 90 tablet, Rfl: 11 .  valACYclovir (VALTREX) 500 MG tablet, TAKE AS DIRECTED FOR 3 DAYS AS NEEDED FOR EACH OUTBREAK (Patient not taking: Reported on 06/10/2019), Disp: 30 tablet, Rfl: 5 Allergies  Allergen Reactions  . Penicillins Hives and Rash    Foamed from mouth Has patient had a PCN reaction causing immediate rash, facial/tongue/throat swelling, SOB or lightheadedness with hypotension: Yes Has patient had a PCN reaction causing severe rash involving mucus membranes or skin necrosis: No Has patient had a PCN reaction that required hospitalization No Has patient had a PCN reaction occurring within the last 10 years: No If all of the above answers are "NO", then may proceed with Cephalosporin use.     Social History   Tobacco Use  . Smoking status: Never Smoker  . Smokeless tobacco: Never Used  Substance Use Topics  . Alcohol use: No    Family History  Problem Relation Age of Onset  . Diabetes Mother   . Hypertension Mother       Review of Systems  Constitutional: negative for fatigue and weight loss Respiratory: negative for cough and wheezing Cardiovascular: negative for chest pain, fatigue and palpitations Gastrointestinal: negative for abdominal pain and change in bowel habits Musculoskeletal:negative for myalgias Neurological: negative for gait problems and tremors Behavioral/Psych: negative for abusive relationship,  depression Endocrine: negative for temperature intolerance    Genitourinary:positive for abnormal menstrual periods.  Negative for genital lesions, hot flashes, sexual problems and vaginal discharge Integument/breast: negative for breast lump, breast tenderness, nipple discharge and skin lesion(s)    Objective:       BP 116/75   Pulse 73   Wt 143 lb (64.9 kg)   BMI 21.74 kg/m  General:   alert  Skin:   no rash or abnormalities  Lungs:   clear to  auscultation bilaterally  Heart:   regular rate and rhythm, S1, S2 normal, no murmur, click, rub or gallop  Breasts:   normal without suspicious masses, skin or nipple changes or axillary nodes  Abdomen:  normal findings: no organomegaly, soft, non-tender and no hernia  Pelvis:  External genitalia: normal general appearance Urinary system: urethral meatus normal and bladder without fullness, nontender Vaginal: normal without tenderness, induration or masses Cervix: normal appearance Adnexa: normal bimanual exam Uterus: anteverted and non-tender, normal size   Lab Review Urine pregnancy test Labs reviewed yes Radiologic studies reviewed yes  50% of 20 min visit spent on counseling and coordination of care.   Assessment:       Plan:    Education reviewed: calcium supplements, depression evaluation, low fat, low cholesterol diet, safe sex/STD prevention, self breast exams and weight bearing exercise. Contraception: none. Follow up in: 1 week.   Nexplanon Removal    Orders Placed This Encounter  Procedures  . Hepatitis B surface antigen  . HIV Antibody (routine testing w rflx)  . RPR  . Hepatitis C antibody    Brock Bad, MD 06/10/2019 11:46 AM

## 2019-06-10 NOTE — Progress Notes (Signed)
Pt is having some lower left sided pelvic pain.  Pt states that she is having AUB with Nexplanon- has tried pills for bleeding and continued once stopped pack.  Discuss antibiotic use for recent UTI.

## 2019-06-10 NOTE — Addendum Note (Signed)
Addended by: Marya Landry D on: 06/10/2019 01:25 PM   Modules accepted: Orders

## 2019-06-11 ENCOUNTER — Other Ambulatory Visit: Payer: Self-pay | Admitting: Obstetrics

## 2019-06-11 DIAGNOSIS — N760 Acute vaginitis: Secondary | ICD-10-CM

## 2019-06-11 DIAGNOSIS — B9689 Other specified bacterial agents as the cause of diseases classified elsewhere: Secondary | ICD-10-CM

## 2019-06-11 LAB — CERVICOVAGINAL ANCILLARY ONLY
Bacterial Vaginitis (gardnerella): POSITIVE — AB
Candida Glabrata: NEGATIVE
Candida Vaginitis: NEGATIVE
Chlamydia: NEGATIVE
Comment: NEGATIVE
Comment: NEGATIVE
Comment: NEGATIVE
Comment: NEGATIVE
Comment: NEGATIVE
Comment: NORMAL
Neisseria Gonorrhea: NEGATIVE
Trichomonas: NEGATIVE

## 2019-06-11 LAB — CYTOLOGY - PAP: Diagnosis: NEGATIVE

## 2019-06-11 MED ORDER — TINIDAZOLE 500 MG PO TABS: 1000.0000 mg | ORAL_TABLET | Freq: Every day | ORAL | 2 refills | Status: DC

## 2019-06-14 ENCOUNTER — Telehealth: Payer: Self-pay

## 2019-06-14 NOTE — Telephone Encounter (Signed)
Patient called and reported that she is having symptoms of hematuria, abdominal pain, and night sweats. Pt diagnosed with UTI on 4/23 and pt reports she could not take the medication that was prescribed to her. Pt reports she was back in office last Thursday and Dr. Clearance Coots prescribed her Keflex to take for the UTI. Pt reports she did not go pick up the medication. Advised pt that she needs to go be seen at Urgent Care or ED to rule out kidney infection since her UTI has gone untreated for a couple weeks. Pt voices understanding and says she will go.

## 2019-06-16 ENCOUNTER — Encounter: Payer: Self-pay | Admitting: Obstetrics

## 2019-06-16 ENCOUNTER — Ambulatory Visit (INDEPENDENT_AMBULATORY_CARE_PROVIDER_SITE_OTHER): Payer: BC Managed Care – PPO | Admitting: Obstetrics

## 2019-06-16 ENCOUNTER — Other Ambulatory Visit: Payer: Self-pay

## 2019-06-16 VITALS — BP 126/84 | HR 69 | Wt 142.0 lb

## 2019-06-16 DIAGNOSIS — Z3046 Encounter for surveillance of implantable subdermal contraceptive: Secondary | ICD-10-CM

## 2019-06-16 NOTE — Progress Notes (Signed)
Patient presents for Nexplanon removal due to AUB.

## 2019-06-17 ENCOUNTER — Encounter: Payer: Self-pay | Admitting: Obstetrics

## 2019-06-17 NOTE — Progress Notes (Signed)
NEXPLANON REMOVAL NOTE  Date of LMP:   unknown  Contraception used: *Nexplanon   Indications:  The patient desires removal of Nexplanon.  She understands risks, benefits, and alternatives to Implanon and would like to proceed.  Anesthesia:   Lidocaine 1% plain.  Procedure:  A time-out was performed confirming the procedure and the patient's allergy status.  Complications: None                      The rod was palpated and the area was sterilely prepped.  The area beneath the distal tip was anesthetized with 1% xylocaine and the skin incised                       Over the tip and the tip was exposed, grasped with forcep and removed intact.  A single suture of 4-0 Vicryl was used to close incision.  Steri strip                       And a bandage applied and the arm was wrapped with gauze bandage.  The patient tolerated well.  Instructions:  The patient was instructed to remove the dressing in 24 hours and that some bruising is to be expected.  She was advised to use over the counter analgesics as needed for any pain at the site.  She is to keep the area dry for 24 hours and to call if her hand or arm becomes cold, numb, or blue.  Return visit:  Return in 2 weeks   Brock Bad, MD 06-16-2019

## 2019-06-22 NOTE — Progress Notes (Signed)
Patient was assessed and managed by nursing staff during this encounter. I have reviewed the chart and agree with the documentation and plan. I have also made any necessary editorial changes.  Catalina Antigua, MD 06/22/2019 3:23 PM

## 2019-06-30 ENCOUNTER — Encounter: Payer: Self-pay | Admitting: Obstetrics

## 2019-06-30 ENCOUNTER — Other Ambulatory Visit: Payer: Self-pay

## 2019-06-30 ENCOUNTER — Ambulatory Visit (INDEPENDENT_AMBULATORY_CARE_PROVIDER_SITE_OTHER): Payer: BC Managed Care – PPO | Admitting: Obstetrics

## 2019-06-30 VITALS — BP 127/84 | HR 75 | Wt 139.8 lb

## 2019-06-30 DIAGNOSIS — Z4889 Encounter for other specified surgical aftercare: Secondary | ICD-10-CM

## 2019-06-30 NOTE — Progress Notes (Signed)
Pt is here for follow up after nexplanon removal 2 weeks ago, pt reports she has no complaints or concerns.

## 2019-06-30 NOTE — Progress Notes (Signed)
Subjective:    Ashley [name] is a 24 y.o. female who presents for follow-up after Nexplanon removal. The patient has no complaints today. The patient is sexually active. Pertinent past medical history: none.  The information documented in the HPI was reviewed and verified.  Menstrual History: OB History    Gravida  2   Para  2   Term  2   Preterm      AB      Living  2     SAB      TAB      Ectopic      Multiple  0   Live Births  2            No LMP recorded. Patient has had an implant.   Patient Active Problem List   Diagnosis Date Noted  . Normal labor 02/19/2018  . Second degree laceration of perineum, delivered, current hospitalization 02/19/2018  . Severe major depression, single episode, without psychotic features ([place]) 11/26/2017  . Depression with suicidal ideation 11/25/2017  . 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 UTI in pregnancy, antepartum, first trimester 09/02/2017  . Vitamin D deficiency 08/13/2017  . Supervision of normal pregnancy, antepartum 03/21/2015  . Concussion without loss of consciousness 12/02/2014   Past Medical History:  Diagnosis Date  . Medical history non-contributory     Past Surgical History:  Procedure Laterality Date  . NO PAST SURGERIES       Current Outpatient Medications:  .  diclofenac (VOLTAREN) 75 MG EC tablet, Take 1 tablet (75 mg total) by mouth 2 (two) times daily with a meal. (Patient not taking: Reported on 06/10/2019), Disp: 30 tablet, Rfl: 2 .  Prenatal Vit-Fe Phos-FA-Omega (VITAFOL GUMMIES) 3.33-0.333-34.8 MG CHEW, Chew 3 each by mouth daily. (Patient not taking: Reported on 03/19/2018), Disp: 90 tablet, Rfl: 11 .  tinidazole (TINDAMAX) 500 MG tablet, Take 2 tablets (1,000 mg total) by mouth daily with breakfast., Disp: 10 tablet, Rfl: 2 .  valACYclovir (VALTREX) 500 MG tablet, TAKE AS DIRECTED FOR 3 DAYS AS NEEDED FOR EACH OUTBREAK (Patient not taking: Reported on 06/10/2019), Disp: 30 tablet, Rfl: 5 Allergies   Allergen Reactions  . Penicillins Hives and Rash    Foamed from mouth Has patient had a PCN reaction causing immediate rash, facial/tongue/throat swelling, SOB or lightheadedness with hypotension: Yes Has patient had a PCN reaction causing severe rash involving mucus membranes or skin necrosis: No Has patient had a PCN reaction that required hospitalization No Has patient had a PCN reaction occurring within the last 10 years: No If all of the above answers are "NO", then may proceed with Cephalosporin use.     Social History   Tobacco Use  . Smoking status: Never Smoker  . Smokeless tobacco: Never Used  Substance Use Topics  . Alcohol use: No    Family History  Problem Relation Age of Onset  . Diabetes Mother   . Hypertension Mother        Review of Systems Constitutional: negative for weight loss Genitourinary:negative for abnormal menstrual periods and vaginal discharge   Objective:   BP 127/84   Pulse 75   Wt 139 lb 12.8 oz (63.4 kg)   BMI 21.26 kg/m    General:   alert and no distress  Skin:   no rash or abnormalities  Lungs:   clear to auscultation bilaterally  Heart:   regular rate and rhythm, S1, S2 normal, no murmur, click, rub or gallop  Left Upper Arm:  Removal incision site is healed,                                              clean,dry and non tender.    Lab Review Urine pregnancy test Labs reviewed yes Radiologic studies reviewed no  50% of 15 min visit spent on counseling and coordination of care.    Assessment:    24 y.o., discontinuing Nexplanon, no contraindications.   Plan:    All questions answered. Follow up in 1 year.  Brock Bad, MD 06/30/2019 4:41 PM

## 2019-07-03 ENCOUNTER — Telehealth: Payer: BC Managed Care – PPO | Admitting: Nurse Practitioner

## 2019-07-03 DIAGNOSIS — R300 Dysuria: Secondary | ICD-10-CM | POA: Diagnosis not present

## 2019-07-03 MED ORDER — NITROFURANTOIN MONOHYD MACRO 100 MG PO CAPS: 100.0000 mg | ORAL_CAPSULE | Freq: Two times a day (BID) | ORAL | 0 refills | Status: DC

## 2019-07-03 NOTE — Progress Notes (Signed)

## 2019-07-29 ENCOUNTER — Encounter (HOSPITAL_COMMUNITY): Payer: Self-pay | Admitting: *Deleted

## 2019-07-29 ENCOUNTER — Other Ambulatory Visit: Payer: Self-pay

## 2019-07-29 ENCOUNTER — Emergency Department (HOSPITAL_COMMUNITY)
Admission: EM | Admit: 2019-07-29 | Discharge: 2019-07-29 | Disposition: A | Discharge status: Home / Self Care | Payer: BC Managed Care – PPO | Attending: Emergency Medicine | Admitting: Emergency Medicine

## 2019-07-29 DIAGNOSIS — R1031 Right lower quadrant pain: Secondary | ICD-10-CM | POA: Diagnosis not present

## 2019-07-29 DIAGNOSIS — Z5321 Procedure and treatment not carried out due to patient leaving prior to being seen by health care provider: Secondary | ICD-10-CM | POA: Insufficient documentation

## 2019-07-29 LAB — URINALYSIS, ROUTINE W REFLEX MICROSCOPIC
Bilirubin Urine: NEGATIVE
Glucose, UA: NEGATIVE mg/dL
Hgb urine dipstick: NEGATIVE
Ketones, ur: NEGATIVE mg/dL
Leukocytes,Ua: NEGATIVE
Nitrite: NEGATIVE
Protein, ur: NEGATIVE mg/dL
Specific Gravity, Urine: 1.01 (ref 1.005–1.030)
pH: 7 (ref 5.0–8.0)

## 2019-07-29 LAB — I-STAT BETA HCG BLOOD, ED (MC, WL, AP ONLY): I-stat hCG, quantitative: <5 m[IU]/mL (ref <5)

## 2019-07-29 LAB — CBC
HCT: 39.1 % (ref 36.0–46.0)
Hemoglobin: 12 g/dL (ref 12.0–15.0)
MCH: 29.1 pg (ref 26.0–34.0)
MCHC: 30.7 g/dL (ref 30.0–36.0)
MCV: 94.7 fL (ref 80.0–100.0)
Platelets: 284 K/uL (ref 150–400)
RBC: 4.13 MIL/uL (ref 3.87–5.11)
RDW: 12.8 % (ref 11.5–15.5)
WBC: 5.1 K/uL (ref 4.0–10.5)
nRBC: 0 % (ref 0.0–0.2)

## 2019-07-29 LAB — COMPREHENSIVE METABOLIC PANEL
ALT: 22 U/L (ref 0–44)
AST: 21 U/L (ref 15–41)
Albumin: 4.1 g/dL (ref 3.5–5.0)
Alkaline Phosphatase: 56 U/L (ref 38–126)
Anion gap: 8 (ref 5–15)
BUN: 10 mg/dL (ref 6–20)
CO2: 27 mmol/L (ref 22–32)
Calcium: 9.6 mg/dL (ref 8.9–10.3)
Chloride: 104 mmol/L (ref 98–111)
Creatinine, Ser: 0.75 mg/dL (ref 0.44–1.00)
GFR calc Af Amer: >60 mL/min (ref >60)
GFR calc non Af Amer: >60 mL/min (ref >60)
Glucose, Bld: 86 mg/dL (ref 70–99)
Potassium: 4.2 mmol/L (ref 3.5–5.1)
Sodium: 139 mmol/L (ref 135–145)
Total Bilirubin: 0.7 mg/dL (ref 0.3–1.2)
Total Protein: 7.4 g/dL (ref 6.5–8.1)

## 2019-07-29 LAB — LIPASE, BLOOD: Lipase: 30 U/L (ref 11–51)

## 2019-07-29 MED ORDER — SODIUM CHLORIDE 0.9% FLUSH: 3.0000 mL | Freq: Once | INTRAVENOUS | Status: DC

## 2019-07-29 NOTE — ED Notes (Signed)
Pt called for room x3 no answer 

## 2019-07-29 NOTE — ED Triage Notes (Signed)
Pt reports consistent RLQ pain x 2 days that radiates around to her back. Denies urinary symptoms but did have recent UTI x 2.

## 2019-07-30 ENCOUNTER — Encounter: Payer: Self-pay | Admitting: Obstetrics

## 2019-07-30 ENCOUNTER — Other Ambulatory Visit (HOSPITAL_COMMUNITY)
Admission: RE | Admit: 2019-07-30 | Discharge: 2019-07-30 | Disposition: A | Discharge status: Home / Self Care | Payer: BC Managed Care – PPO | Source: Ambulatory Visit | Attending: Obstetrics | Admitting: Obstetrics

## 2019-07-30 ENCOUNTER — Ambulatory Visit (INDEPENDENT_AMBULATORY_CARE_PROVIDER_SITE_OTHER): Payer: BC Managed Care – PPO | Admitting: Obstetrics

## 2019-07-30 VITALS — BP 130/86 | HR 79 | Wt 143.2 lb

## 2019-07-30 DIAGNOSIS — N898 Other specified noninflammatory disorders of vagina: Secondary | ICD-10-CM

## 2019-07-30 DIAGNOSIS — B009 Herpesviral infection, unspecified: Secondary | ICD-10-CM

## 2019-07-30 DIAGNOSIS — R102 Pelvic and perineal pain: Secondary | ICD-10-CM | POA: Diagnosis not present

## 2019-07-30 DIAGNOSIS — Z3202 Encounter for pregnancy test, result negative: Secondary | ICD-10-CM | POA: Diagnosis not present

## 2019-07-30 LAB — POCT URINALYSIS DIPSTICK
Bilirubin, UA: NEGATIVE
Blood, UA: NEGATIVE
Glucose, UA: NEGATIVE
Ketones, UA: NEGATIVE
Leukocytes, UA: NEGATIVE
Nitrite, UA: NEGATIVE
Protein, UA: NEGATIVE
Spec Grav, UA: 1.01 (ref 1.010–1.025)
Urobilinogen, UA: 0.2 E.U./dL
pH, UA: 7 (ref 5.0–8.0)

## 2019-07-30 LAB — POCT URINE PREGNANCY: Preg Test, Ur: NEGATIVE

## 2019-07-30 MED ORDER — VALACYCLOVIR HCL 500 MG PO TABS: ORAL_TABLET | ORAL | 5 refills | Status: DC

## 2019-07-30 MED ORDER — IBUPROFEN 800 MG PO TABS: 800.0000 mg | ORAL_TABLET | Freq: Three times a day (TID) | ORAL | 5 refills | Status: DC | PRN

## 2019-07-30 NOTE — Progress Notes (Signed)
Patient ID: Ashley [name], female   DOB: [birth year], 23 y.o.   MRN: [id number]  Chief Complaint  Patient presents with  . Abdominal Pain    HPI Ashley [name] is a 24 y.o. female.  Complains of pelvic pain that has worsened over the past few weeks.  Denies vaginal discharge or dysuria.  HPI  Past Medical History:  Diagnosis Date  . Medical history non-contributory     Past Surgical History:  Procedure Laterality Date  . NO PAST SURGERIES      Family History  Problem Relation Age of Onset  . Diabetes Mother   . Hypertension Mother     Social History Social History   Tobacco Use  . Smoking status: Never Smoker  . Smokeless tobacco: Never Used  Vaping Use  . Vaping Use: Never used  Substance Use Topics  . Alcohol use: No  . Drug use: No    Allergies  Allergen Reactions  . Penicillins Hives and Rash    Foamed from mouth Has patient had a PCN reaction causing immediate rash, facial/tongue/throat swelling, SOB or lightheadedness with hypotension: Yes Has patient had a PCN reaction causing severe rash involving mucus membranes or skin necrosis: No Has patient had a PCN reaction that required hospitalization No Has patient had a PCN reaction occurring within the last 10 years: No If all of the above answers are "NO", then may proceed with Cephalosporin use.     Current Outpatient Medications  Medication Sig Dispense Refill  . ibuprofen (ADVIL) 800 MG tablet Take 1 tablet (800 mg total) by mouth every 8 (eight) hours as needed. 30 tablet 5  . nitrofurantoin, macrocrystal-monohydrate, (MACROBID) 100 MG capsule Take 1 capsule (100 mg total) by mouth 2 (two) times daily. 1 po BId (Patient not taking: Reported on 07/30/2019) 14 capsule 0  . Prenatal Vit-Fe Phos-FA-Omega (VITAFOL GUMMIES) 3.33-0.333-34.8 MG CHEW Chew 3 each by mouth daily. (Patient not taking: Reported on 03/19/2018) 90 tablet 11  . tinidazole (TINDAMAX) 500 MG tablet Take 2 tablets (1,000 mg total) by mouth  daily with breakfast. (Patient not taking: Reported on 07/30/2019) 10 tablet 2  . valACYclovir (VALTREX) 500 MG tablet TAKE AS DIRECTED FOR 3 DAYS AS NEEDED FOR EACH OUTBREAK 30 tablet 5   No current facility-administered medications for this visit.    Review of Systems Review of Systems Constitutional: negative for fatigue and weight loss Respiratory: negative for cough and wheezing Cardiovascular: negative for chest pain, fatigue and palpitations Gastrointestinal: negative for abdominal pain and change in bowel habits Genitourinary:positive for pelvic pain Integument/breast: negative for nipple discharge Musculoskeletal:negative for myalgias Neurological: negative for gait problems and tremors Behavioral/Psych: negative for abusive relationship, depression Endocrine: negative for temperature intolerance      Blood pressure 130/86, pulse 79, weight 143 lb 3.2 oz (65 kg), last menstrual period 07/10/2019, not currently breastfeeding.  Physical Exam Physical Exam           General: Alert and no distress Abdomen:  normal findings: no organomegaly, soft, non-tender and no hernia  Pelvis:  External genitalia: normal general appearance Urinary system: urethral meatus normal and bladder without fullness, nontender Vaginal: normal without tenderness, induration or masses Cervix: normal appearance Adnexa: ? Fullness in right adnexa, tender to palpation Uterus: anteverted and non-tender, normal size    50% of 20 min visit spent on counseling and coordination of care.   Data Reviewed Wet prep  Assessment     1. Pelvic pain Rx: - POCT Urinalysis Dipstick - POCT  urine pregnancy - US PELVIC COMPLETE WITH TRANSVAGINAL; Future - ibuprofen (ADVIL) 800 MG tablet; Take 1 tablet (800 mg total) by mouth every 8 (eight) hours as needed.  Dispense: 30 tablet; Refill: 5  2. Vaginal discharge Rx: - Cervicovaginal ancillary only( [place])  3. HSV-2 infection Rx: - valACYclovir (VALTREX)  500 MG tablet; TAKE AS DIRECTED FOR 3 DAYS AS NEEDED FOR EACH OUTBREAK  Dispense: 30 tablet; Refill: 5  Plan   Follow up in 2 weeks  Orders Placed This Encounter  Procedures  . US PELVIC COMPLETE WITH TRANSVAGINAL    [facility]/medicaid Epic order No Trave/No Covid No Stimulator/ No Body Injectors WT: 143/No Needs 32 OZ LIQUID 1 HR. PRIOR TO APPOINTMENT.    Standing Status:   Future    Standing Expiration Date:   07/29/2020    Order Specific Question:   Reason for Exam (SYMPTOM  OR DIAGNOSIS REQUIRED)    Answer:   Pelvic pain    Order Specific Question:   Preferred imaging location?    Answer:   [facility]  . POCT Urinalysis Dipstick  . POCT urine pregnancy   Meds ordered this encounter  Medications  . valACYclovir (VALTREX) 500 MG tablet    Sig: TAKE AS DIRECTED FOR 3 DAYS AS NEEDED FOR EACH OUTBREAK    Dispense:  30 tablet    Refill:  5  . ibuprofen (ADVIL) 800 MG tablet    Sig: Take 1 tablet (800 mg total) by mouth every 8 (eight) hours as needed.    Dispense:  30 tablet    Refill:  5     Brock Bad, MD 07/30/2019 2:00 PM

## 2019-07-30 NOTE — Progress Notes (Signed)
Pt c/o RLQ pain x 2 days. Denies urinary sx's.

## 2019-08-04 ENCOUNTER — Other Ambulatory Visit: Payer: Self-pay | Admitting: Obstetrics

## 2019-08-04 LAB — CERVICOVAGINAL ANCILLARY ONLY
Bacterial Vaginitis (gardnerella): POSITIVE — AB
Candida Glabrata: NEGATIVE
Candida Vaginitis: NEGATIVE
Chlamydia: NEGATIVE
Comment: NEGATIVE
Comment: NEGATIVE
Comment: NEGATIVE
Comment: NEGATIVE
Comment: NEGATIVE
Comment: NORMAL
Neisseria Gonorrhea: NEGATIVE
Trichomonas: NEGATIVE

## 2019-08-10 ENCOUNTER — Ambulatory Visit
Admission: RE | Admit: 2019-08-10 | Discharge: 2019-08-10 | Disposition: A | Discharge status: Home / Self Care | Payer: BC Managed Care – PPO | Source: Ambulatory Visit | Attending: Obstetrics | Admitting: Obstetrics

## 2019-08-10 DIAGNOSIS — R102 Pelvic and perineal pain: Secondary | ICD-10-CM

## 2019-08-11 ENCOUNTER — Other Ambulatory Visit: Payer: BC Managed Care – PPO

## 2019-09-27 ENCOUNTER — Other Ambulatory Visit: Payer: Self-pay | Admitting: Obstetrics

## 2019-09-27 DIAGNOSIS — B009 Herpesviral infection, unspecified: Secondary | ICD-10-CM

## 2019-10-05 ENCOUNTER — Telehealth: Payer: Self-pay | Admitting: *Deleted

## 2019-10-05 ENCOUNTER — Other Ambulatory Visit: Payer: Self-pay | Admitting: Obstetrics

## 2019-10-05 DIAGNOSIS — N3001 Acute cystitis with hematuria: Secondary | ICD-10-CM

## 2019-10-05 MED ORDER — NITROFURANTOIN MONOHYD MACRO 100 MG PO CAPS: 100.0000 mg | ORAL_CAPSULE | Freq: Two times a day (BID) | ORAL | 2 refills | Status: DC

## 2019-10-05 MED ORDER — CEPHALEXIN 500 MG PO CAPS: 500.0000 mg | ORAL_CAPSULE | Freq: Three times a day (TID) | ORAL | 0 refills | Status: DC

## 2019-10-05 NOTE — Telephone Encounter (Signed)
Pt made aware of change in antibiotic for UTI. Pt advised to complete new medication and if still symptomatic she will need to come in office for urine check.  Pt states understandin.

## 2019-10-05 NOTE — Telephone Encounter (Signed)
D/C Keflex Macrobid Rx

## 2019-10-05 NOTE — Telephone Encounter (Signed)
Keflex Rx for UTI

## 2019-10-14 ENCOUNTER — Telehealth: Payer: BC Managed Care – PPO | Admitting: Emergency Medicine

## 2019-10-14 DIAGNOSIS — R110 Nausea: Secondary | ICD-10-CM

## 2019-10-14 NOTE — Progress Notes (Signed)
Based on what you shared with me, I feel your condition warrants further evaluation and I recommend that you be seen for a face to face visit.  You may need to have some labs drawn in order to better determine what the cause of your symptoms are.  I don't think I can adequately diagnose your symptoms through an e-visit.  Please contact your primary care physician practice to be seen. Many offices offer virtual options to be seen via video if you are not comfortable going in person to a medical facility at this time.  If you do not have a PCP, [facility] offers a free physician referral service available at [phone]. Our trained staff has the experience, knowledge and resources to put you in touch with a physician who is right for you.   You also have the option of a video visit through [email or web address]  If you are having a true medical emergency please call 911.  NOTE: If you entered your credit card information for this eVisit, you will not be charged. You may see a "hold" on your card for the $35 but that hold will drop off and you will not have a charge processed.  Your e-visit answers were reviewed by a board certified advanced clinical practitioner to complete your personal care plan.  Thank you for using e-Visits.  Approximately 5 minutes was used in reviewing the patient's chart, questionnaire, prescribing medications, and documentation.

## 2019-12-01 ENCOUNTER — Other Ambulatory Visit: Payer: Self-pay

## 2019-12-01 ENCOUNTER — Other Ambulatory Visit (HOSPITAL_COMMUNITY)
Admission: RE | Admit: 2019-12-01 | Discharge: 2019-12-01 | Disposition: A | Discharge status: Home / Self Care | Payer: BC Managed Care – PPO | Source: Ambulatory Visit | Attending: Obstetrics and Gynecology | Admitting: Obstetrics and Gynecology

## 2019-12-01 ENCOUNTER — Ambulatory Visit (INDEPENDENT_AMBULATORY_CARE_PROVIDER_SITE_OTHER): Payer: BC Managed Care – PPO

## 2019-12-01 DIAGNOSIS — Z113 Encounter for screening for infections with a predominantly sexual mode of transmission: Secondary | ICD-10-CM

## 2019-12-01 NOTE — Progress Notes (Signed)
SUBJECTIVE:  24 y.o. female complains of no symptoms at this time.She would like to be check for STD Denies abnormal vaginal bleeding or significant pelvic pain or fever. No UTI symptoms. Denies history of known exposure to STD.  No LMP recorded.  OBJECTIVE:  She appears well, afebrile. Urine dipstick:none   ASSESSMENT:  Vaginal Discharge: none  Vaginal Odor: none    PLAN:  GC, chlamydia, trichomonas, BVAG, CVAG probe sent to lab. Treatment: To be determined once lab results are received ROV prn if symptoms persist or worsen.

## 2019-12-02 ENCOUNTER — Other Ambulatory Visit: Payer: Self-pay | Admitting: Obstetrics and Gynecology

## 2019-12-02 LAB — CERVICOVAGINAL ANCILLARY ONLY
Bacterial Vaginitis (gardnerella): POSITIVE — AB
Candida Glabrata: NEGATIVE
Candida Vaginitis: POSITIVE — AB
Chlamydia: NEGATIVE
Comment: NEGATIVE
Comment: NEGATIVE
Comment: NEGATIVE
Comment: NEGATIVE
Comment: NEGATIVE
Comment: NORMAL
Neisseria Gonorrhea: NEGATIVE
Trichomonas: NEGATIVE

## 2019-12-02 MED ORDER — FLUCONAZOLE 150 MG PO TABS: 150.0000 mg | ORAL_TABLET | Freq: Once | ORAL | 0 refills | Status: AC

## 2019-12-02 MED ORDER — METRONIDAZOLE 500 MG PO TABS: 500.0000 mg | ORAL_TABLET | Freq: Two times a day (BID) | ORAL | 0 refills | Status: DC

## 2019-12-06 HISTORY — PX: KNEE SURGERY: SHX244

## 2019-12-24 ENCOUNTER — Other Ambulatory Visit: Payer: Self-pay | Admitting: Obstetrics

## 2019-12-29 ENCOUNTER — Other Ambulatory Visit: Payer: Self-pay | Admitting: Obstetrics

## 2019-12-29 DIAGNOSIS — B009 Herpesviral infection, unspecified: Secondary | ICD-10-CM

## 2019-12-31 ENCOUNTER — Other Ambulatory Visit: Payer: Self-pay | Admitting: Obstetrics and Gynecology

## 2020-01-01 ENCOUNTER — Telehealth: Payer: BC Managed Care – PPO | Admitting: Orthopedic Surgery

## 2020-01-01 DIAGNOSIS — N760 Acute vaginitis: Secondary | ICD-10-CM

## 2020-01-01 MED ORDER — FLUCONAZOLE 150 MG PO TABS: 150.0000 mg | ORAL_TABLET | Freq: Once | ORAL | 0 refills | Status: AC

## 2020-01-01 NOTE — Progress Notes (Signed)
We are sorry that you are not feeling well. Here is how we plan to help! Based on what you shared with me it looks like you: May have a yeast vaginosis  Vaginosis is an inflammation of the vagina that can result in discharge, itching and pain. The cause is usually a change in the normal balance of vaginal bacteria or an infection. Vaginosis can also result from reduced estrogen levels after menopause.  The most common causes of vaginosis are:   Bacterial vaginosis which results from an overgrowth of one on several organisms that are normally present in your vagina.   Yeast infections which are caused by a naturally occurring fungus called candida.   Vaginal atrophy (atrophic vaginosis) which results from the thinning of the vagina from reduced estrogen levels after menopause.   Trichomoniasis which is caused by a parasite and is commonly transmitted by sexual intercourse.  Factors that increase your risk of developing vaginosis include: . Medications, such as antibiotics and steroids . Uncontrolled diabetes . Use of hygiene products such as bubble bath, vaginal spray or vaginal deodorant . Douching . Wearing damp or tight-fitting clothing . Using an intrauterine device (IUD) for birth control . Hormonal changes, such as those associated with pregnancy, birth control pills or menopause . Sexual activity . Having a sexually transmitted infection  Your treatment plan is A single Diflucan (fluconazole) 150mg tablet once.  I have electronically sent this prescription into the pharmacy that you have chosen.  Be sure to take all of the medication as directed. Stop taking any medication if you develop a rash, tongue swelling or shortness of breath. Mothers who are breast feeding should consider pumping and discarding their breast milk while on these antibiotics. However, there is no consensus that infant exposure at these doses would be harmful.  Remember that medication creams can weaken latex  condoms. .   HOME CARE:  Good hygiene may prevent some types of vaginosis from recurring and may relieve some symptoms:  . Avoid baths, hot tubs and whirlpool spas. Rinse soap from your outer genital area after a shower, and dry the area well to prevent irritation. Don't use scented or harsh soaps, such as those with deodorant or antibacterial action. . Avoid irritants. These include scented tampons and pads. . Wipe from front to back after using the toilet. Doing so avoids spreading fecal bacteria to your vagina.  Other things that may help prevent vaginosis include:  . Don't douche. Your vagina doesn't require cleansing other than normal bathing. Repetitive douching disrupts the normal organisms that reside in the vagina and can actually increase your risk of vaginal infection. Douching won't clear up a vaginal infection. . Use a latex condom. Both female and female latex condoms may help you avoid infections spread by sexual contact. . Wear cotton underwear. Also wear pantyhose with a cotton crotch. If you feel comfortable without it, skip wearing underwear to bed. Yeast thrives in moist environments Your symptoms should improve in the next day or two.  GET HELP RIGHT AWAY IF:  . You have pain in your lower abdomen ( pelvic area or over your ovaries) . You develop nausea or vomiting . You develop a fever . Your discharge changes or worsens . You have persistent pain with intercourse . You develop shortness of breath, a rapid pulse, or you faint.  These symptoms could be signs of problems or infections that need to be evaluated by a medical provider now.  MAKE SURE YOU      Understand these instructions.  Will watch your condition.  Will get help right away if you are not doing well or get worse.  Your e-visit answers were reviewed by a board certified advanced clinical practitioner to complete your personal care plan. Depending upon the condition, your plan could have included  both over the counter or prescription medications. Please review your pharmacy choice to make sure that you have choses a pharmacy that is open for you to pick up any needed prescription, Your safety is important to us. If you have drug allergies check your prescription carefully.   You can use MyChart to ask questions about today's visit, request a non-urgent call back, or ask for a work or school excuse for 24 hours related to this e-Visit. If it has been greater than 24 hours you will need to follow up with your provider, or enter a new e-Visit to address those concerns. You will get a MyChart message within the next two days asking about your experience. I hope that your e-visit has been valuable and will speed your recovery.  Greater than 5 minutes, yet less than 10 minutes of time have been spent researching, coordinating and implementing care for this patient today.   

## 2020-01-11 ENCOUNTER — Telehealth: Payer: BC Managed Care – PPO | Admitting: Emergency Medicine

## 2020-01-11 DIAGNOSIS — R110 Nausea: Secondary | ICD-10-CM

## 2020-01-11 NOTE — Progress Notes (Signed)
Vomiting in pregnancy can lead to significant dehydration with is dangerous for your child. For the safety of you and your child, I recommend a face to face office visit with a health care provider.  Many mothers need to take medicines during their pregnancy and while nursing.  Almost all medicines pass into the breast milk in small quantities.  Most are generally considered safe for a mother to take but some medicines must be avoided.  After reviewing your E-Visit request, I recommend that you consult your OB/GYN or pediatrician for medical advice in relation to your condition and prescription medications while pregnant or breastfeeding. NOTE: If you entered your credit card information for this eVisit, you will not be charged. You may see a "hold" on your card for the $35 but that hold will drop off and you will not have a charge processed.  If you are having a true medical emergency please call 911.     For an urgent face to face visit, [facility] has four urgent care centers for your convenience:    NEW:  [facility] [place] [phone] [address] [place], [place] [address] .  Monday - Friday 10 am - 6 pm     . [facility]    [phone]                  Get Driving Directions  [address] [place], [place] [address] . 10 am to 8 pm Monday-Friday . 12 pm to 8 pm Saturday-Sunday   . [facility] at [facility]  [phone]                  Get Driving Directions  [address], [address] [place], [place] [address] . 8 am to 8 pm Monday-Friday . 9 am to 6 pm Saturday . 11 am to 6 pm Sunday   . [facility] at [facility]  [phone]                  Get Driving Directions   [address] [place], [place] [address] . 8 am to 8 pm Monday-Friday . 8 am to 4 pm Saturday-Sunday    . [facility] at [facility] Directions   [phone]  [address] [place], [place] [address]  . Monday-Friday, 12 PM to 6 PM    Your e-visit answers were reviewed by a board certified advanced clinical practitioner to complete your personal care plan.  Thank you for using e-Visits.  Approximately 5 minutes was used in reviewing the patient's chart, questionnaire, prescribing medications, and documentation.

## 2020-01-13 ENCOUNTER — Other Ambulatory Visit: Payer: Self-pay

## 2020-01-13 ENCOUNTER — Other Ambulatory Visit (HOSPITAL_COMMUNITY)
Admission: RE | Admit: 2020-01-13 | Discharge: 2020-01-13 | Disposition: A | Discharge status: Home / Self Care | Payer: BC Managed Care – PPO | Source: Ambulatory Visit | Attending: Obstetrics & Gynecology | Admitting: Obstetrics & Gynecology

## 2020-01-13 ENCOUNTER — Encounter: Payer: Self-pay | Admitting: Obstetrics & Gynecology

## 2020-01-13 ENCOUNTER — Ambulatory Visit (INDEPENDENT_AMBULATORY_CARE_PROVIDER_SITE_OTHER): Payer: BC Managed Care – PPO | Admitting: Obstetrics & Gynecology

## 2020-01-13 VITALS — Ht 68.0 in | Wt 146.4 lb

## 2020-01-13 DIAGNOSIS — Z3042 Encounter for surveillance of injectable contraceptive: Secondary | ICD-10-CM | POA: Diagnosis not present

## 2020-01-13 DIAGNOSIS — N898 Other specified noninflammatory disorders of vagina: Secondary | ICD-10-CM

## 2020-01-13 NOTE — Progress Notes (Signed)
   GYNECOLOGY OFFICE VISIT NOTE  History:   Ashley [name] is a 24 y.o. [id number] here today for follow up from ER (in [place]) visit for left ovarian cyst. Was evaluated yesterday for pain, had benign exam and negative wet prep and labs, sent home on Etolodac for pain. Today, pain is markedly improved.  She wants to do testing for her white discharge (although she had negative wet prep yesterday).  She would also like to discuss starting Depo Provera. She last had intercourse this morning, and is having intercourse frequently unprotected. She took Plan B this morning.  Not concerned about STIs with her current partner.  She denies any odor or irritation. She denies any current abnormal vaginal bleeding, pelvic pain or other concerns.    Past Medical History:  Diagnosis Date  . Medical history non-contributory     Past Surgical History:  Procedure Laterality Date  . NO PAST SURGERIES      The following portions of the patient's history were reviewed and updated as appropriate: allergies, current medications, past family history, past medical history, past social history, past surgical history and problem list.   Health Maintenance:  Normal pap on 06/10/2019.   Review of Systems:  Pertinent items noted in HPI and remainder of comprehensive ROS otherwise negative.  Physical Exam:  Ht 5\' 8"  (1.727 m)   Wt 146 lb 6.4 oz (66.4 kg)   LMP 12/24/2019   BMI 22.26 kg/m  CONSTITUTIONAL: Well-developed, well-nourished female in no acute distress.  MUSCULOSKELETAL: Normal range of motion. No edema noted. NEUROLOGIC: Alert and oriented to person, place, and time. Normal muscle tone coordination. No cranial nerve deficit noted. PSYCHIATRIC: Normal mood and affect. Normal behavior. Normal judgment and thought content. CARDIOVASCULAR: Normal heart rate noted RESPIRATORY: Effort and breath sounds normal, no problems with respiration noted ABDOMEN: No masses noted. No other overt distention noted.    PELVIC: Deferred (patient did self-swab)     Assessment and Plan:      1. Vaginal discharge - Cervicovaginal ancillary only( [place]) done, will follow up results and manage accordingly.  2. Encounter for Depo-Provera contraception counseling Counseled about Depo Provera, she declines all other contraceptive methods. As per protocol, no unprotected sex x 2 weeks is required.  Counseled about Plan B use and efficacy given her unprotected intercourse around expected ovulation period recently.  She was told to check a home pregnancy test if period does not come as expected.  For now, Depo Provera was scheduled in 2 weeks, protected intercourse recommended.  Routine preventative health maintenance measures emphasized. Please refer to After Visit Summary for other counseling recommendations.   Return in about 2 weeks (around 01/27/2020) for Depo Provera injection (RN visit).    I spent 15 minutes dedicated to the care of this patient including pre-visit review of records, face to face time with the patient discussing her conditions and treatments and post visit ordering of testing.    01/29/2020, MD, FACOG Obstetrician & Gynecologist, [facility] for [facility]

## 2020-01-13 NOTE — Patient Instructions (Signed)
Medroxyprogesterone injection [Contraceptive] What is this medicine? MEDROXYPROGESTERONE (me DROX ee proe JES te rone) contraceptive injections prevent pregnancy. They provide effective birth control for 3 months. Depo-subQ Provera 104 is also used for treating pain related to endometriosis. This medicine may be used for other purposes; ask your health care provider or pharmacist if you have questions. COMMON BRAND NAME(S): Depo-Provera, Depo-subQ Provera 104 What should I tell my health care provider before I take this medicine? They need to know if you have any of these conditions:  frequently drink alcohol  asthma  blood vessel disease or a history of a blood clot in the lungs or legs  bone disease such as osteoporosis  breast cancer  diabetes  eating disorder (anorexia nervosa or bulimia)  high blood pressure  HIV infection or AIDS  kidney disease  liver disease  mental depression  migraine  seizures (convulsions)  stroke  tobacco smoker  vaginal bleeding  an unusual or allergic reaction to medroxyprogesterone, other hormones, medicines, foods, dyes, or preservatives  pregnant or trying to get pregnant  breast-feeding How should I use this medicine? Depo-Provera Contraceptive injection is given into a muscle. Depo-subQ Provera 104 injection is given under the skin. These injections are given by a health care professional. You must not be pregnant before getting an injection. The injection is usually given during the first 5 days after the start of a menstrual period or 6 weeks after delivery of a baby. Talk to your pediatrician regarding the use of this medicine in children. Special care may be needed. These injections have been used in female children who have started having menstrual periods. Overdosage: If you think you have taken too much of this medicine contact a poison control center or emergency room at once. NOTE: This medicine is only for you. Do not  share this medicine with others. What if I miss a dose? Try not to miss a dose. You must get an injection once every 3 months to maintain birth control. If you cannot keep an appointment, call and reschedule it. If you wait longer than 13 weeks between Depo-Provera contraceptive injections or longer than 14 weeks between Depo-subQ Provera 104 injections, you could get pregnant. Use another method for birth control if you miss your appointment. You may also need a pregnancy test before receiving another injection. What may interact with this medicine? Do not take this medicine with any of the following medications:  bosentan This medicine may also interact with the following medications:  aminoglutethimide  antibiotics or medicines for infections, especially rifampin, rifabutin, rifapentine, and griseofulvin  aprepitant  barbiturate medicines such as phenobarbital or primidone  bexarotene  carbamazepine  medicines for seizures like ethotoin, felbamate, oxcarbazepine, phenytoin, topiramate  modafinil  St. John's wort This list may not describe all possible interactions. Give your health care provider a list of all the medicines, herbs, non-prescription drugs, or dietary supplements you use. Also tell them if you smoke, drink alcohol, or use illegal drugs. Some items may interact with your medicine. What should I watch for while using this medicine? This drug does not protect you against HIV infection (AIDS) or other sexually transmitted diseases. Use of this product may cause you to lose calcium from your bones. Loss of calcium may cause weak bones (osteoporosis). Only use this product for more than 2 years if other forms of birth control are not right for you. The longer you use this product for birth control the more likely you will be at risk   for weak bones. Ask your health care professional how you can keep strong bones. You may have a change in bleeding pattern or irregular periods.  Many females stop having periods while taking this drug. If you have received your injections on time, your chance of being pregnant is very low. If you think you may be pregnant, see your health care professional as soon as possible. Tell your health care professional if you want to get pregnant within the next year. The effect of this medicine may last a long time after you get your last injection. What side effects may I notice from receiving this medicine? Side effects that you should report to your doctor or health care professional as soon as possible:  allergic reactions like skin rash, itching or hives, swelling of the face, lips, or tongue  breast tenderness or discharge  breathing problems  changes in vision  depression  feeling faint or lightheaded, falls  fever  pain in the abdomen, chest, groin, or leg  problems with balance, talking, walking  unusually weak or tired  yellowing of the eyes or skin Side effects that usually do not require medical attention (report to your doctor or health care professional if they continue or are bothersome):  acne  fluid retention and swelling  headache  irregular periods, spotting, or absent periods  temporary pain, itching, or skin reaction at site where injected  weight gain This list may not describe all possible side effects. Call your doctor for medical advice about side effects. You may report side effects to FDA at 1-800-FDA-1088. Where should I keep my medicine? This does not apply. The injection will be given to you by a health care professional. NOTE: This sheet is a summary. It may not cover all possible information. If you have questions about this medicine, talk to your doctor, pharmacist, or health care provider.  2020 Elsevier/Gold Standard (2008-02-12 18:37:56)  

## 2020-01-14 LAB — CERVICOVAGINAL ANCILLARY ONLY
Bacterial Vaginitis (gardnerella): NEGATIVE
Candida Glabrata: NEGATIVE
Candida Vaginitis: NEGATIVE
Chlamydia: NEGATIVE
Comment: NEGATIVE
Comment: NEGATIVE
Comment: NEGATIVE
Comment: NEGATIVE
Comment: NEGATIVE
Comment: NORMAL
Neisseria Gonorrhea: NEGATIVE
Trichomonas: NEGATIVE

## 2020-01-15 ENCOUNTER — Telehealth: Payer: BC Managed Care – PPO | Admitting: Physician Assistant

## 2020-01-15 DIAGNOSIS — N760 Acute vaginitis: Secondary | ICD-10-CM | POA: Diagnosis not present

## 2020-01-15 MED ORDER — METRONIDAZOLE 500 MG PO TABS: 500.0000 mg | ORAL_TABLET | Freq: Two times a day (BID) | ORAL | 0 refills | Status: DC

## 2020-01-15 NOTE — Progress Notes (Signed)
We are sorry that you are not feeling well. Here is how we plan to help! Based on what you shared with me it looks like you: May have a vaginosis due to bacteria  Vaginosis is an inflammation of the vagina that can result in discharge, itching and pain. The cause is usually a change in the normal balance of vaginal bacteria or an infection. Vaginosis can also result from reduced estrogen levels after menopause.  The most common causes of vaginosis are:   Bacterial vaginosis which results from an overgrowth of one on several organisms that are normally present in your vagina.   Yeast infections which are caused by a naturally occurring fungus called candida.   Vaginal atrophy (atrophic vaginosis) which results from the thinning of the vagina from reduced estrogen levels after menopause.   Trichomoniasis which is caused by a parasite and is commonly transmitted by sexual intercourse.  Factors that increase your risk of developing vaginosis include:  Medications, such as antibiotics and steroids  Uncontrolled diabetes  Use of hygiene products such as bubble bath, vaginal spray or vaginal deodorant  Douching  Wearing damp or tight-fitting clothing  Using an intrauterine device (IUD) for birth control  Hormonal changes, such as those associated with pregnancy, birth control pills or menopause  Sexual activity  Having a sexually transmitted infection  Your treatment plan is Metronidazole or Flagyl 500mg  twice a day for 7 days.  I have electronically sent this prescription into the pharmacy that you have chosen.  Be sure to take all of the medication as directed. Stop taking any medication if you develop a rash, tongue swelling or shortness of breath. Mothers who are breast feeding should consider pumping and discarding their breast milk while on these antibiotics. However, there is no consensus that infant exposure at these doses would be harmful.  Remember that medication creams can  weaken latex condoms.   HOME CARE:  Good hygiene may prevent some types of vaginosis from recurring and may relieve some symptoms:   Avoid baths, hot tubs and whirlpool spas. Rinse soap from your outer genital area after a shower, and dry the area well to prevent irritation. Don't use scented or harsh soaps, such as those with deodorant or antibacterial action.  Avoid irritants. These include scented tampons and pads.  Wipe from front to back after using the toilet. Doing so avoids spreading fecal bacteria to your vagina.  Other things that may help prevent vaginosis include:   Don't douche. Your vagina doesn't require cleansing other than normal bathing. Repetitive douching disrupts the normal organisms that reside in the vagina and can actually increase your risk of vaginal infection. Douching won't clear up a vaginal infection.  Use a latex condom. Both female and female latex condoms may help you avoid infections spread by sexual contact.  Wear cotton underwear. Also wear pantyhose with a cotton crotch. If you feel comfortable without it, skip wearing underwear to bed. Yeast thrives in [place] Your symptoms should improve in the next day or two.  GET HELP RIGHT AWAY IF:   You have pain in your lower abdomen ( pelvic area or over your ovaries)  You develop nausea or vomiting  You develop a fever  Your discharge changes or worsens  You have persistent pain with intercourse  You develop shortness of breath, a rapid pulse, or you faint.  These symptoms could be signs of problems or infections that need to be evaluated by a medical provider now.  MAKE SURE YOU    Understand these instructions.  Will watch your condition.  Will get help right away if you are not doing well or get worse.  Your e-visit answers were reviewed by a board certified advanced clinical practitioner to complete your personal care plan. Depending upon the condition, your plan could  have included both over the counter or prescription medications. Please review your pharmacy choice to make sure that you have choses a pharmacy that is open for you to pick up any needed prescription, Your safety is important to [place]. If you have drug allergies check your prescription carefully.   You can use MyChart to ask questions about todays visit, request a non-urgent call back, or ask for a work or school excuse for 24 hours related to this e-Visit. If it has been greater than 24 hours you will need to follow up with your provider, or enter a new e-Visit to address those concerns. You will get a MyChart message within the next two days asking about your experience. I hope that your e-visit has been valuable and will speed your recovery.  Greater than 5 minutes, yet less than 10 minutes of time have been spent researching, coordinating, and implementing care for this patient today.  Jarold Motto PA-C

## 2020-01-31 ENCOUNTER — Other Ambulatory Visit: Payer: Self-pay

## 2020-01-31 ENCOUNTER — Ambulatory Visit (INDEPENDENT_AMBULATORY_CARE_PROVIDER_SITE_OTHER): Payer: BC Managed Care – PPO

## 2020-01-31 DIAGNOSIS — Z3042 Encounter for surveillance of injectable contraceptive: Secondary | ICD-10-CM

## 2020-01-31 LAB — POCT URINE PREGNANCY: Preg Test, Ur: NEGATIVE

## 2020-01-31 MED ORDER — MEDROXYPROGESTERONE ACETATE 150 MG/ML IM SUSP: 150.0000 mg | INTRAMUSCULAR | 3 refills | Status: DC

## 2020-01-31 MED ORDER — MEDROXYPROGESTERONE ACETATE 150 MG/ML IM SUSP: 150.0000 mg | INTRAMUSCULAR | 0 refills | Status: DC

## 2020-01-31 MED ORDER — MEDROXYPROGESTERONE ACETATE 150 MG/ML IM SUSP
150.0000 mg | Freq: Once | INTRAMUSCULAR | Status: AC
  Administered 2020-01-31: 16:00:00 150 mg via INTRAMUSCULAR

## 2020-01-31 NOTE — Progress Notes (Signed)
Patient was assessed and managed by nursing staff during this encounter. I have reviewed the chart and agree with the documentation and plan. I have also made any necessary editorial changes.  Catalina Antigua, MD 01/31/2020 4:47 PM

## 2020-01-31 NOTE — Progress Notes (Signed)
Pt presents for UPT Injection  Pt denies any unprotected intercourse.  UPT Negative   Injection given in LUOQ Pt tolerated injection well  Made aware to schedule next injection.

## 2020-02-21 ENCOUNTER — Telehealth: Payer: Medicaid Other | Admitting: Family

## 2020-02-21 DIAGNOSIS — Z20822 Contact with and (suspected) exposure to covid-19: Secondary | ICD-10-CM

## 2020-02-21 MED ORDER — BENZONATATE 100 MG PO CAPS: 100.0000 mg | ORAL_CAPSULE | Freq: Three times a day (TID) | ORAL | 0 refills | Status: DC | PRN

## 2020-02-21 MED ORDER — DEXAMETHASONE 6 MG PO TABS: 6.0000 mg | ORAL_TABLET | Freq: Two times a day (BID) | ORAL | 0 refills | Status: DC

## 2020-02-21 MED ORDER — FLUTICASONE PROPIONATE 50 MCG/ACT NA SUSP: 2.0000 | Freq: Every day | NASAL | 6 refills | Status: DC

## 2020-02-21 MED ORDER — ALBUTEROL SULFATE HFA 108 (90 BASE) MCG/ACT IN AERS: 2.0000 | INHALATION_SPRAY | Freq: Four times a day (QID) | RESPIRATORY_TRACT | 0 refills | Status: DC | PRN

## 2020-02-21 NOTE — Progress Notes (Signed)
E-Visit for Corona Virus Screening  We are sorry you are not feeling well. We are here to help!  You have tested positive for COVID-19, meaning that you were infected with the novel coronavirus and could give the virus to others.  It is vitally important that you stay home so you do not spread it to others.      Please continue isolation at home, for at least 10 days since the start of your symptoms and until you have had 24 hours with no fever (without taking a fever reducer) and with improving of symptoms.  If you have no symptoms but tested positive (or all symptoms resolve after 5 days and you have no fever) you can leave your house but continue to wear a mask around others for an additional 5 days. If you have a fever,continue to stay home until you have had 24 hours of no fever. Most cases improve 5-10 days from onset but we have seen a small number of patients who have gotten worse after the 10 days.  Please be sure to watch for worsening symptoms and remain taking the proper precautions.   Go to the nearest hospital ED for assessment if fever/cough/breathlessness are severe or illness seems like a threat to life.    The following symptoms may appear 2-14 days after exposure: . Fever . Cough . Shortness of breath or difficulty breathing . Chills . Repeated shaking with chills . Muscle pain . Headache . Sore throat . New loss of taste or smell . Fatigue . Congestion or runny nose . Nausea or vomiting . Diarrhea  You have been enrolled in [facility] Monitoring for COVID-19. Daily you will receive a questionnaire within the MyChart website. Our COVID-19 response team will be monitoring your responses daily.  You can use medication such as A prescription cough medication called Tessalon Perles 100 mg. You may take 1-2 capsules every 8 hours as needed for cough, A prescription inhaler called Albuterol MDI 90 mcg /actuation 2 puffs every 4 hours as needed for shortness of breath,  wheezing, cough and A prescription for Fluticasone nasal spray 2 sprays in each nostril one time per day and dexamethasone 6 mg twice a day for 7 days.     You may also take acetaminophen (Tylenol) as needed for fever.  HOME CARE: . Only take medications as instructed by your medical team. . Drink plenty of fluids and get plenty of rest. . A steam or ultrasonic humidifier can help if you have congestion.   GET HELP RIGHT AWAY IF YOU HAVE EMERGENCY WARNING SIGNS.  Call 911 or proceed to your closest emergency facility if: . You develop worsening high fever. . Trouble breathing . Bluish lips or face . Persistent pain or pressure in the chest . New confusion . Inability to wake or stay awake . You cough up blood. . Your symptoms become more severe . Inability to hold down food or fluids  This list is not all possible symptoms. Contact your medical provider for any symptoms that are severe or concerning to you.    Your e-visit answers were reviewed by a board certified advanced clinical practitioner to complete your personal care plan.  Depending on the condition, your plan could have included both over the counter or prescription medications.  If there is a problem please reply once you have received a response from your provider.  Your safety is important to [place].  If you have drug allergies check your prescription carefully.  You can use MyChart to ask questions about today's visit, request a non-urgent call back, or ask for a work or school excuse for 24 hours related to this e-Visit. If it has been greater than 24 hours you will need to follow up with your provider, or enter a new e-Visit to address those concerns. You will get an e-mail in the next two days asking about your experience.  I hope that your e-visit has been valuable and will speed your recovery. Thank you for using e-visits.

## 2020-03-01 ENCOUNTER — Telehealth: Payer: Medicaid Other | Admitting: Family

## 2020-03-01 DIAGNOSIS — B373 Candidiasis of vulva and vagina: Secondary | ICD-10-CM | POA: Diagnosis not present

## 2020-03-01 DIAGNOSIS — B3731 Acute candidiasis of vulva and vagina: Secondary | ICD-10-CM

## 2020-03-01 MED ORDER — FLUCONAZOLE 150 MG PO TABS: 150.0000 mg | ORAL_TABLET | ORAL | 0 refills | Status: DC | PRN

## 2020-03-01 NOTE — Progress Notes (Signed)

## 2020-03-13 ENCOUNTER — Other Ambulatory Visit: Payer: Self-pay | Admitting: Obstetrics

## 2020-03-13 DIAGNOSIS — N939 Abnormal uterine and vaginal bleeding, unspecified: Secondary | ICD-10-CM

## 2020-03-13 MED ORDER — ORTHO-NOVUM 1/35 (28) 1-35 MG-MCG PO TABS
1.0000 | ORAL_TABLET | Freq: Every day | ORAL | 11 refills | Status: DC
Start: 2020-03-13 — End: 2020-06-12

## 2020-04-24 ENCOUNTER — Ambulatory Visit (INDEPENDENT_AMBULATORY_CARE_PROVIDER_SITE_OTHER): Payer: Medicaid Other

## 2020-04-24 ENCOUNTER — Other Ambulatory Visit: Payer: Self-pay

## 2020-04-24 VITALS — BP 108/73 | Ht 68.0 in | Wt 145.0 lb

## 2020-04-24 DIAGNOSIS — Z3042 Encounter for surveillance of injectable contraceptive: Secondary | ICD-10-CM

## 2020-04-24 MED ORDER — MEDROXYPROGESTERONE ACETATE 150 MG/ML IM SUSP: 150.0000 mg | INTRAMUSCULAR | 1 refills | Status: DC

## 2020-04-24 MED ORDER — MEDROXYPROGESTERONE ACETATE 150 MG/ML IM SUSP
150.0000 mg | Freq: Once | INTRAMUSCULAR | Status: AC
  Administered 2020-04-24: 150 mg via INTRAMUSCULAR

## 2020-04-24 NOTE — Progress Notes (Signed)
Pt needs Depo rf. She has an appt today for Depo inj. Last annual 06/2019 Depo rx sent per protocol

## 2020-04-24 NOTE — Progress Notes (Signed)
Pt presents today for Depo Provera injection. Depo Provera 150mg  given IM RUOQ. Pt tolerated well with no side effects noted. Next injection due 07/10/2020-07/24/2020. Pt is also due for yearly exam in May 2022. Pt to schedule when leaving today.

## 2020-05-04 ENCOUNTER — Other Ambulatory Visit: Payer: Self-pay

## 2020-05-04 ENCOUNTER — Other Ambulatory Visit (HOSPITAL_COMMUNITY)
Admission: RE | Admit: 2020-05-04 | Discharge: 2020-05-04 | Disposition: A | Discharge status: Home / Self Care | Payer: Medicaid Other | Source: Ambulatory Visit | Attending: Obstetrics & Gynecology | Admitting: Obstetrics & Gynecology

## 2020-05-04 ENCOUNTER — Ambulatory Visit (INDEPENDENT_AMBULATORY_CARE_PROVIDER_SITE_OTHER): Payer: Medicaid Other

## 2020-05-04 DIAGNOSIS — N898 Other specified noninflammatory disorders of vagina: Secondary | ICD-10-CM | POA: Diagnosis not present

## 2020-05-04 DIAGNOSIS — Z113 Encounter for screening for infections with a predominantly sexual mode of transmission: Secondary | ICD-10-CM | POA: Insufficient documentation

## 2020-05-04 NOTE — Addendum Note (Signed)
Addended by: Marya Landry D on: 05/04/2020 01:10 PM   Modules accepted: Orders

## 2020-05-04 NOTE — Progress Notes (Signed)
SUBJECTIVE:  25 y.o. female complains of white vaginal discharge. Denies abnormal vaginal bleeding or significant pelvic pain or fever. No UTI symptoms. Denies history of known exposure to STD.  LMP 04/10/2020  OBJECTIVE:  She appears well, afebrile. Urine dipstick: not done.  ASSESSMENT:  Vaginal Discharge  Vaginal Odor   PLAN:  GC, chlamydia, trichomonas, BVAG, CVAG probe sent to lab. Treatment: To be determined once lab results are reviewed by the provider.  ROV prn if symptoms persist or worsen.

## 2020-05-05 LAB — RPR+HBSAG+HCVAB+...
HIV Screen 4th Generation wRfx: NONREACTIVE
Hep C Virus Ab: <0.1 s/co ratio (ref 0.0–0.9)
Hepatitis B Surface Ag: NEGATIVE
RPR Ser Ql: NONREACTIVE

## 2020-05-05 LAB — CERVICOVAGINAL ANCILLARY ONLY
Bacterial Vaginitis (gardnerella): NEGATIVE
Candida Glabrata: NEGATIVE
Candida Vaginitis: NEGATIVE
Chlamydia: NEGATIVE
Comment: NEGATIVE
Comment: NEGATIVE
Comment: NEGATIVE
Comment: NEGATIVE
Comment: NEGATIVE
Comment: NORMAL
Neisseria Gonorrhea: NEGATIVE
Trichomonas: NEGATIVE

## 2020-05-15 ENCOUNTER — Other Ambulatory Visit: Payer: Self-pay | Admitting: Obstetrics

## 2020-05-15 ENCOUNTER — Telehealth: Payer: Medicaid Other | Admitting: Physician Assistant

## 2020-05-15 DIAGNOSIS — N760 Acute vaginitis: Secondary | ICD-10-CM | POA: Diagnosis not present

## 2020-05-15 DIAGNOSIS — B009 Herpesviral infection, unspecified: Secondary | ICD-10-CM

## 2020-05-15 DIAGNOSIS — B9689 Other specified bacterial agents as the cause of diseases classified elsewhere: Secondary | ICD-10-CM | POA: Diagnosis not present

## 2020-05-15 MED ORDER — METRONIDAZOLE 500 MG PO TABS: 500.0000 mg | ORAL_TABLET | Freq: Two times a day (BID) | ORAL | 0 refills | Status: DC

## 2020-05-15 NOTE — Progress Notes (Signed)
I have spent 5 minutes in review of e-visit questionnaire, review and updating patient chart, medical decision making and response to patient.   Kanton Kamel Cody Larrie Fraizer, PA-C    

## 2020-05-15 NOTE — Progress Notes (Signed)

## 2020-06-12 ENCOUNTER — Encounter: Payer: Self-pay | Admitting: Obstetrics

## 2020-06-12 ENCOUNTER — Other Ambulatory Visit (HOSPITAL_COMMUNITY)
Admission: RE | Admit: 2020-06-12 | Discharge: 2020-06-12 | Disposition: A | Discharge status: Home / Self Care | Payer: Medicaid Other | Source: Ambulatory Visit | Attending: Obstetrics | Admitting: Obstetrics

## 2020-06-12 ENCOUNTER — Ambulatory Visit (INDEPENDENT_AMBULATORY_CARE_PROVIDER_SITE_OTHER): Payer: Medicaid Other | Admitting: Obstetrics

## 2020-06-12 ENCOUNTER — Other Ambulatory Visit: Payer: Self-pay

## 2020-06-12 VITALS — BP 116/79 | HR 69 | Ht 67.0 in | Wt 143.0 lb

## 2020-06-12 DIAGNOSIS — Z3042 Encounter for surveillance of injectable contraceptive: Secondary | ICD-10-CM

## 2020-06-12 DIAGNOSIS — B009 Herpesviral infection, unspecified: Secondary | ICD-10-CM

## 2020-06-12 DIAGNOSIS — N898 Other specified noninflammatory disorders of vagina: Secondary | ICD-10-CM | POA: Insufficient documentation

## 2020-06-12 DIAGNOSIS — Z01419 Encounter for gynecological examination (general) (routine) without abnormal findings: Secondary | ICD-10-CM | POA: Insufficient documentation

## 2020-06-12 DIAGNOSIS — Z113 Encounter for screening for infections with a predominantly sexual mode of transmission: Secondary | ICD-10-CM

## 2020-06-12 MED ORDER — VITAFOL ULTRA 29-0.6-0.4-200 MG PO CAPS: 1.0000 | ORAL_CAPSULE | Freq: Every day | ORAL | 11 refills | Status: DC

## 2020-06-12 MED ORDER — VALACYCLOVIR HCL 500 MG PO TABS: ORAL_TABLET | ORAL | 11 refills | Status: DC

## 2020-06-12 NOTE — Progress Notes (Addendum)
Subjective:        Ashley [name] is a 25 y.o. female here for a routine exam.  Current complaints: Vaginal discharge.    Personal health questionnaire:  Is patient [name], have a family history of breast and/or ovarian cancer: no Is there a family history of uterine cancer diagnosed at age < 81, gastrointestinal cancer, urinary tract cancer, family member who is a Personnel officer syndrome-associated carrier: no Is the patient overweight and hypertensive, family history of diabetes, personal history of gestational diabetes, preeclampsia or PCOS: no Is patient over 7, have PCOS,  family history of premature CHD under age 19, diabetes, smoke, have hypertension or peripheral artery disease:  no At any time, has a partner hit, kicked or otherwise hurt or frightened you?: no Over the past 2 weeks, have you felt down, depressed or hopeless?: no Over the past 2 weeks, have you felt little interest or pleasure in doing things?:no   Gynecologic History No LMP recorded. Contraception: Depo-Provera injections Last Pap: 06-10-2019. Results were: normal Last mammogram: n/a. Results were: n/a  Obstetric History OB History  Gravida Para Term Preterm AB Living  2 2 2     2   SAB IAB Ectopic Multiple Live Births        0 2    # Outcome Date GA Lbr Len/2nd Weight Sex Delivery Anes PTL Lv  2 Term 02/19/18 [redacted]w[redacted]d 13:43 / 00:10 7 lb 0.7 oz (3.195 kg) M Vag-Spont Local, EPI  LIV  1 Term 09/20/15 [redacted]w[redacted]d  5 lb 13.1 oz (2.639 kg) M Vag-Spont EPI  LIV    Past Medical History:  Diagnosis Date  . Medical history non-contributory     Past Surgical History:  Procedure Laterality Date  . NO PAST SURGERIES       Current Outpatient Medications:  .  medroxyPROGESTERone (DEPO-PROVERA) 150 MG/ML injection, Inject 1 mL (150 mg total) into the muscle every 3 (three) months., Disp: 1 mL, Rfl: 1 .  dexamethasone (DECADRON) 6 MG tablet, Take 1 tablet (6 mg total) by mouth 2 (two) times daily., Disp: 14 tablet,  Rfl: 0 .  fluticasone (FLONASE) 50 MCG/ACT nasal spray, Place 2 sprays into both nostrils daily., Disp: 16 g, Rfl: 6 .  Prenat-Fe Poly-Methfol-FA-DHA (VITAFOL ULTRA) 29-0.6-0.4-200 MG CAPS, Take 1 capsule by mouth daily before breakfast., Disp: 30 capsule, Rfl: 11 .  valACYclovir (VALTREX) 500 MG tablet, TAKE AS DIRECTED FOR 3 DAYS AS NEEDED FOR EACH OUTBREAK, Disp: 30 tablet, Rfl: 11 Allergies  Allergen Reactions  . Penicillins Hives and Rash    Foamed from mouth Has patient had a PCN reaction causing immediate rash, facial/tongue/throat swelling, SOB or lightheadedness with hypotension: Yes Has patient had a PCN reaction causing severe rash involving mucus membranes or skin necrosis: No Has patient had a PCN reaction that required hospitalization No Has patient had a PCN reaction occurring within the last 10 years: No If all of the above answers are "NO", then may proceed with Cephalosporin use.     Social History   Tobacco Use  . Smoking status: Never Smoker  . Smokeless tobacco: Never Used  Substance Use Topics  . Alcohol use: No    Family History  Problem Relation Age of Onset  . Diabetes Mother   . Hypertension Mother       Review of Systems  Constitutional: negative for fatigue and weight loss Respiratory: negative for cough and wheezing Cardiovascular: negative for chest pain, fatigue and palpitations Gastrointestinal: negative for abdominal pain  and change in bowel habits Musculoskeletal:negative for myalgias Neurological: negative for gait problems and tremors Behavioral/Psych: negative for abusive relationship, depression Endocrine: negative for temperature intolerance    Genitourinary: positive for vaginal discharge.  negative for abnormal menstrual periods, genital lesions, hot flashes, sexual problems  Integument/breast: negative for breast lump, breast tenderness, nipple discharge and skin lesion(s)    Objective:       BP 116/79   Pulse 69   Ht 5\' 7"   (1.702 m)   Wt 143 lb (64.9 kg)   BMI 22.40 kg/m  General:   alert and no distress  Skin:   no rash or abnormalities  Lungs:   clear to auscultation bilaterally  Heart:   regular rate and rhythm, S1, S2 normal, no murmur, click, rub or gallop  Breasts:   normal without suspicious masses, skin or nipple changes or axillary nodes  Abdomen:  normal findings: no organomegaly, soft, non-tender and no hernia  Pelvis:  External genitalia: normal general appearance Urinary system: urethral meatus normal and bladder without fullness, nontender Vaginal: normal without tenderness, induration or masses Cervix: normal appearance Adnexa: normal bimanual exam Uterus: anteverted and non-tender, normal size   Lab Review Urine pregnancy test Labs reviewed yes Radiologic studies reviewed no  I have spent a total of 20 minutes of face-to-face time, excluding clinical staff time, reviewing notes and preparing to see patient, ordering tests and/or medications, and counseling the patient.  Assessment:     1. Encounter for gynecological examination with Papanicolaou smear of cervix Rx: - Cytology - PAP( )  2. Vaginal discharge Rx: - Cervicovaginal ancillary only( )  3. Screen for STD (sexually transmitted disease) Rx: - HIV Antibody (routine testing w rflx) - Hepatitis B surface antigen - RPR - Hepatitis C antibody  4. Encounter for surveillance of injectable contraceptive - pleased with Depo Provera    Plan:    Education reviewed: calcium supplements, depression evaluation, low fat, low cholesterol diet, safe sex/STD prevention, self breast exams and weight bearing exercise. Contraception: Depo-Provera injections. Follow up in: 1 year.    Orders Placed This Encounter  Procedures  . HIV Antibody (routine testing w rflx)  . Hepatitis B surface antigen  . RPR  . Hepatitis C antibody    , MD 06/12/2020 10:16 AM

## 2020-06-12 NOTE — Addendum Note (Signed)
Addended by: Coral Ceo A on: 06/12/2020 10:16 AM   Modules accepted: Orders

## 2020-06-13 LAB — CERVICOVAGINAL ANCILLARY ONLY
Bacterial Vaginitis (gardnerella): NEGATIVE
Candida Glabrata: NEGATIVE
Candida Vaginitis: NEGATIVE
Chlamydia: NEGATIVE
Comment: NEGATIVE
Comment: NEGATIVE
Comment: NEGATIVE
Comment: NEGATIVE
Comment: NEGATIVE
Comment: NORMAL
Neisseria Gonorrhea: NEGATIVE
Trichomonas: NEGATIVE

## 2020-06-13 LAB — HEPATITIS C ANTIBODY: Hep C Virus Ab: <0.1 s/co ratio (ref 0.0–0.9)

## 2020-06-13 LAB — RPR: RPR Ser Ql: NONREACTIVE

## 2020-06-13 LAB — HIV ANTIBODY (ROUTINE TESTING W REFLEX): HIV Screen 4th Generation wRfx: NONREACTIVE

## 2020-06-13 LAB — HEPATITIS B SURFACE ANTIGEN: Hepatitis B Surface Ag: NEGATIVE

## 2020-06-14 LAB — CYTOLOGY - PAP: Diagnosis: NEGATIVE

## 2020-06-27 ENCOUNTER — Telehealth: Payer: Medicaid Other | Admitting: Family

## 2020-06-27 DIAGNOSIS — R112 Nausea with vomiting, unspecified: Secondary | ICD-10-CM | POA: Diagnosis not present

## 2020-06-27 MED ORDER — ONDANSETRON HCL 4 MG PO TABS: 4.0000 mg | ORAL_TABLET | Freq: Three times a day (TID) | ORAL | 0 refills | Status: DC | PRN

## 2020-06-27 NOTE — Progress Notes (Signed)
We are sorry that you are not feeling well. Here is how we plan to help!  Based on what you have shared with me it looks like you have a Virus that is irritating your GI tract.  Vomiting is the forceful emptying of a portion of the stomach's content through the mouth.  Although nausea and vomiting can make you feel miserable, it's important to remember that these are not diseases, but rather symptoms of an underlying illness.  When we treat short term symptoms, we always caution that any symptoms that persist should be fully evaluated in a medical office.  I have prescribed a medication that will help alleviate your symptoms and allow you to stay hydrated:  Zofran 4 mg 1 tablet every 8 hours as needed for nausea and vomiting.  If your symptoms do not improve or worsen, you will need to be seen in person.   HOME CARE:  Drink clear liquids.  This is very important! Dehydration (the lack of fluid) can lead to a serious complication.  Start off with 1 tablespoon every 5 minutes for 8 hours.  You may begin eating bland foods after 8 hours without vomiting.  Start with saltine crackers, white bread, rice, mashed potatoes, applesauce.  After 48 hours on a bland diet, you may resume a normal diet.  Try to go to sleep.  Sleep often empties the stomach and relieves the need to vomit.  GET HELP RIGHT AWAY IF:   Your symptoms do not improve or worsen within 2 days after treatment.  You have a fever for over 3 days.  You cannot keep down fluids after trying the medication.  MAKE SURE YOU:   Understand these instructions.  Will watch your condition.  Will get help right away if you are not doing well or get worse.   Thank you for choosing an e-visit. Your e-visit answers were reviewed by a board certified advanced clinical practitioner to complete your personal care plan. Depending upon the condition, your plan could have included both over the counter or prescription medications. Please  review your pharmacy choice. Be sure that the pharmacy you have chosen is open so that you can pick up your prescription now.  If there is a problem you may message your provider in MyChart to have the prescription routed to another pharmacy. Your safety is important to [place]. If you have drug allergies check your prescription carefully.  For the next 24 hours, you can use MyChart to ask questions about today's visit, request a non-urgent call back, or ask for a work or school excuse from your e-visit provider. You will get an e-mail in the next two days asking about your experience. I hope that your e-visit has been valuable and will speed your recovery.  Approximately 5 minutes was spent documenting and reviewing patient's chart.

## 2020-07-04 ENCOUNTER — Telehealth: Payer: Medicaid Other | Admitting: Physician Assistant

## 2020-07-04 DIAGNOSIS — J069 Acute upper respiratory infection, unspecified: Secondary | ICD-10-CM

## 2020-07-04 MED ORDER — FLUTICASONE PROPIONATE 50 MCG/ACT NA SUSP: 2.0000 | Freq: Every day | NASAL | 0 refills | Status: DC

## 2020-07-04 NOTE — Progress Notes (Signed)
I have spent 5 minutes in review of e-visit questionnaire, review and updating patient chart, medical decision making and response to patient.   Deziree Mokry Cody Nyomie Ehrlich, PA-C    

## 2020-07-04 NOTE — Progress Notes (Signed)
We are sorry you are not feeling well.  Here is how we plan to help!  Based on what you have shared with me, it looks like you may have a viral upper respiratory infection.  Upper respiratory infections are caused by a large number of viruses; however, rhinovirus is the most common cause.   Symptoms vary from person to person, with common symptoms including sore throat, cough, fatigue or lack of energy and feeling of general discomfort.  A low-grade fever of up to 100.4 may present, but is often uncommon.  Symptoms vary however, and are closely related to a person's age or underlying illnesses.  The most common symptoms associated with an upper respiratory infection are nasal discharge or congestion, cough, sneezing, headache and pressure in the ears and face.  These symptoms usually persist for about 3 to 10 days, but can last up to 2 weeks.  It is important to know that upper respiratory infections do not cause serious illness or complications in most cases.    Upper respiratory infections can be transmitted from person to person, with the most common method of transmission being a person's hands.  The virus is able to live on the skin and can infect other persons for up to 2 hours after direct contact.  Also, these can be transmitted when someone coughs or sneezes; thus, it is important to cover the mouth to reduce this risk.  To keep the spread of the illness at bay, good hand hygiene is very important.  This is an infection that is most likely caused by a virus. There are no specific treatments other than to help you with the symptoms until the infection runs its course.  We are sorry you are not feeling well.  Here is how we plan to help!   For nasal congestion, you may use an oral decongestants such as Mucinex D or if you have glaucoma or high blood pressure use plain Mucinex.  Saline nasal spray or nasal drops can help and can safely be used as often as needed for congestion.  For your congestion,  I have prescribed Fluticasone nasal spray one spray in each nostril twice a day  If you do not have a history of heart disease, hypertension, diabetes or thyroid disease, prostate/bladder issues or glaucoma, you may also use Sudafed to treat nasal congestion.  It is highly recommended that you consult with a pharmacist or your primary care physician to ensure this medication is safe for you to take.     If you have a cough, you may use cough suppressants such as Delsym and Robitussin.  If you have glaucoma or high blood pressure, you can also use Coricidin HBP.     If you have a sore or scratchy throat, use a saltwater gargle-  to  teaspoon of salt dissolved in a 4-ounce to 8-ounce glass of warm water.  Gargle the solution for approximately 15-30 seconds and then spit.  It is important not to swallow the solution.  You can also use throat lozenges/cough drops and Chloraseptic spray to help with throat pain or discomfort.  Warm or cold liquids can also be helpful in relieving throat pain.  For headache, pain or general discomfort, you can use Ibuprofen or Tylenol as directed.   Some authorities believe that zinc sprays or the use of Echinacea may shorten the course of your symptoms.   HOME CARE Only take medications as instructed by your medical team. Be sure to drink plenty of   fluids. Water is fine as well as fruit juices, sodas and electrolyte beverages. You may want to stay away from caffeine or alcohol. If you are nauseated, try taking small sips of liquids. How do you know if you are getting enough fluid? Your urine should be a pale yellow or almost colorless. Get rest. Taking a steamy shower or using a humidifier may help nasal congestion and ease sore throat pain. You can place a towel over your head and breathe in the steam from hot water coming from a faucet. Using a saline nasal spray works much the same way. Cough drops, hard candies and sore throat lozenges may ease your cough. Avoid  close contacts especially the very young and the elderly Cover your mouth if you cough or sneeze Always remember to wash your hands.   GET HELP RIGHT AWAY IF: You develop worsening fever. If your symptoms do not improve within 10 days You develop yellow or green discharge from your nose over 3 days. You have coughing fits You develop a severe head ache or visual changes. You develop shortness of breath, difficulty breathing or start having chest pain Your symptoms persist after you have completed your treatment plan  MAKE SURE YOU  Understand these instructions. Will watch your condition. Will get help right away if you are not doing well or get worse.  Your e-visit answers were reviewed by a board certified advanced clinical practitioner to complete your personal care plan. Depending upon the condition, your plan could have included both over the counter or prescription medications. Please review your pharmacy choice. If there is a problem, you may call our nursing hot line at and have the prescription routed to another pharmacy. Your safety is important to us. If you have drug allergies check your prescription carefully.   You can use MyChart to ask questions about today's visit, request a non-urgent call back, or ask for a work or school excuse for 24 hours related to this e-Visit. If it has been greater than 24 hours you will need to follow up with your provider, or enter a new e-Visit to address those concerns. You will get an e-mail in the next two days asking about your experience.  I hope that your e-visit has been valuable and will speed your recovery. Thank you for using e-visits.     

## 2020-07-05 NOTE — Addendum Note (Signed)
Addended by: Margaretann Loveless on: 07/05/2020 08:41 AM   Modules accepted: Orders

## 2020-07-17 ENCOUNTER — Ambulatory Visit (INDEPENDENT_AMBULATORY_CARE_PROVIDER_SITE_OTHER): Payer: Medicaid Other | Admitting: *Deleted

## 2020-07-17 ENCOUNTER — Other Ambulatory Visit: Payer: Self-pay

## 2020-07-17 DIAGNOSIS — Z3042 Encounter for surveillance of injectable contraceptive: Secondary | ICD-10-CM

## 2020-07-17 MED ORDER — MEDROXYPROGESTERONE ACETATE 150 MG/ML IM SUSP
150.0000 mg | Freq: Once | INTRAMUSCULAR | Status: AC
  Administered 2020-07-17: 150 mg via INTRAMUSCULAR

## 2020-07-17 NOTE — Progress Notes (Addendum)
Date last pap: 06/12/20. Last Depo-Provera: 04/24/20. Pt is on time for Depo. Depo-Provera 150 mg IM given, pt tolerated well.   Next appointment due 8/29-9/12/22.  Administrations This Visit     medroxyPROGESTERone (DEPO-PROVERA) injection 150 mg     Admin Date 07/17/2020 Action Given Dose 150 mg Route Intramuscular Administered By Lanney Gins, CMA            Agree with A & P

## 2020-10-10 ENCOUNTER — Ambulatory Visit (INDEPENDENT_AMBULATORY_CARE_PROVIDER_SITE_OTHER): Payer: Medicaid Other

## 2020-10-10 ENCOUNTER — Other Ambulatory Visit: Payer: Self-pay

## 2020-10-10 VITALS — BP 116/77 | HR 80 | Ht 67.0 in | Wt 150.0 lb

## 2020-10-10 DIAGNOSIS — Z3042 Encounter for surveillance of injectable contraceptive: Secondary | ICD-10-CM | POA: Diagnosis not present

## 2020-10-10 MED ORDER — MEDROXYPROGESTERONE ACETATE 150 MG/ML IM SUSP: 150.0000 mg | INTRAMUSCULAR | 4 refills | Status: DC

## 2020-10-10 MED ORDER — MEDROXYPROGESTERONE ACETATE 150 MG/ML IM SUSP
150.0000 mg | Freq: Once | INTRAMUSCULAR | Status: AC
  Administered 2020-10-10: 150 mg via INTRAMUSCULAR

## 2020-10-10 NOTE — Progress Notes (Addendum)
SUBJECTIVE: Ashley [name] is a 25 y.o. female who presents for DEPO Injection.   OBJECTIVE: Appears well, in no apparent distress.  Vital signs are normal.  ASSESSMENT: On time for DEPO. Last PAP 06/12/2020.  PLAN: DEPO Injection given in [facility], tolerated well. Next DEPO due 11/22-12/07/2020.  RX sent to Pharmacy with 4 refills.  Administrations This Visit     medroxyPROGESTERone (DEPO-PROVERA) injection 150 mg     Admin Date 10/10/2020 Action Given Dose 150 mg Route Intramuscular Administered By Maretta Bees, RMA

## 2020-10-12 ENCOUNTER — Telehealth: Payer: Medicaid Other | Admitting: Physician Assistant

## 2020-10-12 DIAGNOSIS — R1031 Right lower quadrant pain: Secondary | ICD-10-CM

## 2020-10-12 NOTE — Progress Notes (Signed)
Based on what you shared with me, I feel your condition warrants further evaluation as soon as possible at an Emergency department.   With pain in the right lower quadrant of the abdomen it could be an ovarian cyst/ovarian torsion, ectopic pregnancy, or appendicitis. I do feel it is best you be seen in person where they can do an ultrasound to better determine what is going on. These are all medical emergencies.   NOTE: There will be NO CHARGE for this eVisit   If you are having a true medical emergency please call 911.      Emergency Department-[facility]  Get Driving Directions  [phone]  [address]  [place], [place] [address]  Open 24/7/365      [facility] Emergency Department at [facility]  Get Driving Directions  [address]  [place], [place] [address]  Open 24/7/365    Emergency Department- [facility]  Get Driving Directions  [phone]  [address]  [place], [place] [address]  Open 24/7/365      [facility] at [facility]  Get Driving Directions  [phone]  [address]  [place], [place] [address]  Open 24/7/365    [facility]  Emergency Department- [facility]  Get Driving Directions  [phone]  [address]  [place], [place] [address]  Open 24/7/365    [place]  Emergency Department- [facility]  Get Driving Directions  [address]  [place], [place] [address]  Open 24/7/365    [facility]  Emergency Department- [facility]  Get Driving Directions  [phone]  [address]  [place], [place] [address]  Open 24/7/365

## 2020-10-20 ENCOUNTER — Encounter: Payer: Self-pay | Admitting: Physician Assistant

## 2020-10-20 ENCOUNTER — Telehealth: Payer: Medicaid Other | Admitting: Physician Assistant

## 2020-10-20 DIAGNOSIS — M545 Low back pain, unspecified: Secondary | ICD-10-CM

## 2020-10-20 DIAGNOSIS — R109 Unspecified abdominal pain: Secondary | ICD-10-CM

## 2020-10-20 NOTE — Progress Notes (Signed)
Based on what you shared with me, I feel your condition warrants further evaluation and I recommend that you be seen in a face to face visit.   Ms Ashley [name],  Sorry you are not feeling well! Unfortunately, we will not be able to make an evaluation for abdominal pain through the Evisit. If you would like, you can connect with [place] via video for further evaluation. However, video visit might be limited- and since you are stating that you have severe pain, I would recommend that you have a face to face visit for further evaluation of your symptoms.    NOTE: There will be NO CHARGE for this eVisit   If you are having a true medical emergency please call 911.      For an urgent face to face visit, [facility] has six urgent care centers for your convenience:     [facility] Urgent Care Center at [facility] Directions [phone] [address] [place], [place] [address]    [facility]) Get Driving Directions [phone] [address] [place], [place] [address]  [facility] Urgent Care Center [facility]) Get Driving Directions [phone] [address] [place],  [place]  [address]  [facility] Health Urgent Care at [facility] Get Driving Directions [phone] [address] [place], [place] [address]   [facility] Health Urgent Care at [facility] Get Driving Directions  [phone] [address] [place], [place] [address]   [facility] Health Urgent Care at [facility] Directions [phone] [address], [address] [place], [place] [address]  Your MyChart E-visit questionnaire answers were reviewed by a board certified advanced clinical practitioner to complete your personal care plan based on your specific symptoms.  Thank you for using e-Visits.   I spent 5-10 minutes on review and completion of this note- [facility]

## 2020-11-07 ENCOUNTER — Telehealth: Payer: Medicaid Other | Admitting: Physician Assistant

## 2020-11-07 DIAGNOSIS — G43909 Migraine, unspecified, not intractable, without status migrainosus: Secondary | ICD-10-CM | POA: Diagnosis not present

## 2020-11-07 DIAGNOSIS — R110 Nausea: Secondary | ICD-10-CM | POA: Diagnosis not present

## 2020-11-07 MED ORDER — ONDANSETRON HCL 4 MG PO TABS: 4.0000 mg | ORAL_TABLET | Freq: Three times a day (TID) | ORAL | 0 refills | Status: DC | PRN

## 2020-11-07 NOTE — Progress Notes (Signed)
E-Visit for Nausea and Vomiting   We are sorry that you are not feeling well. Here is how we plan to help!  Based on what you have shared with me it looks like you have a migraine with nausea. Vomiting is the forceful emptying of a portion of the stomach's content through the mouth.  Although nausea and vomiting can make you feel miserable, it's important to remember that these are not diseases, but rather symptoms of an underlying illness.  When we treat short term symptoms, we always caution that any symptoms that persist should be fully evaluated in a medical office.  I have prescribed a medication that will help alleviate your symptoms and allow you to stay hydrated:  Zofran 4 mg 1 tablet every 8 hours as needed for nausea and vomiting  HOME CARE: Drink clear liquids.  This is very important! Dehydration (the lack of fluid) can lead to a serious complication.  Start off with 1 tablespoon every 5 minutes for 8 hours. You may begin eating bland foods after 8 hours without vomiting.  Start with saltine crackers, white bread, rice, mashed potatoes, applesauce. After 48 hours on a bland diet, you may resume a normal diet. Try to go to sleep.  Sleep often empties the stomach and relieves the need to vomit.  GET HELP RIGHT AWAY IF:  Your symptoms do not improve or worsen within 2 days after treatment. You have a fever for over 3 days. You cannot keep down fluids after trying the medication.  MAKE SURE YOU:  Understand these instructions. Will watch your condition. Will get help right away if you are not doing well or get worse.    Thank you for choosing an e-visit.  Your e-visit answers were reviewed by a board certified advanced clinical practitioner to complete your personal care plan. Depending upon the condition, your plan could have included both over the counter or prescription medications.  Please review your pharmacy choice. Make sure the pharmacy is open so you can pick up  prescription now. If there is a problem, you may contact your provider through [employer] and have the prescription routed to another pharmacy.  Your safety is important to [place]. If you have drug allergies check your prescription carefully.   For the next 24 hours you can use MyChart to ask questions about today's visit, request a non-urgent call back, or ask for a work or school excuse. You will get an email in the next two days asking about your experience. I hope that your e-visit has been valuable and will speed your recovery.  I provided 6 minutes of non face-to-face time during this encounter for chart review and documentation.

## 2021-01-02 ENCOUNTER — Other Ambulatory Visit: Payer: Self-pay

## 2021-01-02 ENCOUNTER — Telehealth: Payer: Medicaid Other | Admitting: Physician Assistant

## 2021-01-02 ENCOUNTER — Ambulatory Visit (INDEPENDENT_AMBULATORY_CARE_PROVIDER_SITE_OTHER): Payer: Medicaid Other | Admitting: *Deleted

## 2021-01-02 VITALS — BP 129/85 | HR 72

## 2021-01-02 DIAGNOSIS — Z3042 Encounter for surveillance of injectable contraceptive: Secondary | ICD-10-CM

## 2021-01-02 DIAGNOSIS — J029 Acute pharyngitis, unspecified: Secondary | ICD-10-CM

## 2021-01-02 MED ORDER — MEDROXYPROGESTERONE ACETATE 150 MG/ML IM SUSP
150.0000 mg | Freq: Once | INTRAMUSCULAR | Status: AC
  Administered 2021-01-02: 150 mg via INTRAMUSCULAR

## 2021-01-02 NOTE — Progress Notes (Signed)
Date last pap: 06/12/20. Last Depo-Provera: 10/10/20. Side Effects if any: NA. Serum HCG indicated? NA. Depo-Provera 150 mg IM given by: Selena Batten. Rolley Sims, RNC in [employer]. Next appointment due 03/20/21-04/03/21.

## 2021-01-02 NOTE — Progress Notes (Signed)
  E-Visit for Sore Throat  We are sorry that you are not feeling well.  Here is how we plan to help!  Your symptoms indicate a likely viral infection (Pharyngitis).   Pharyngitis is inflammation in the back of the throat which can cause a sore throat, scratchiness and sometimes difficulty swallowing.   Pharyngitis is typically caused by a respiratory virus and will just run its course.  Please keep in mind that your symptoms could last up to 10 days.  For throat pain, we recommend over the counter oral pain relief medications such as acetaminophen or aspirin, or anti-inflammatory medications such as ibuprofen or naproxen sodium.  Topical treatments such as oral throat lozenges or sprays may be used as needed.  Avoid close contact with loved ones, especially the very young and elderly.  Remember to wash your hands thoroughly throughout the day as this is the number one way to prevent the spread of infection and wipe down door knobs and counters with disinfectant.  After careful review of your answers, I would not recommend and antibiotic for your condition.  Antibiotics should not be used to treat conditions that we suspect are caused by viruses like the virus that causes the common cold or flu. However, some people can have Strep with atypical symptoms. You may need formal testing in clinic or office to confirm if your symptoms continue or worsen.  Providers prescribe antibiotics to treat infections caused by bacteria. Antibiotics are very powerful in treating bacterial infections when they are used properly.  To maintain their effectiveness, they should be used only when necessary.  Overuse of antibiotics has resulted in the development of super bugs that are resistant to treatment!    Home Care: Only take medications as instructed by your medical team. Do not drink alcohol while taking these medications. A steam or ultrasonic humidifier can help congestion.  You can place a towel over your head and  breathe in the steam from hot water coming from a faucet. Avoid close contacts especially the very young and the elderly. Cover your mouth when you cough or sneeze. Always remember to wash your hands.  Get Help Right Away If: You develop worsening fever or throat pain. You develop a severe head ache or visual changes. Your symptoms persist after you have completed your treatment plan.  Make sure you Understand these instructions. Will watch your condition. Will get help right away if you are not doing well or get worse.   Thank you for choosing an e-visit.  Your e-visit answers were reviewed by a board certified advanced clinical practitioner to complete your personal care plan. Depending upon the condition, your plan could have included both over the counter or prescription medications.  Please review your pharmacy choice. Make sure the pharmacy is open so you can pick up prescription now. If there is a problem, you may contact your provider through MyChart messaging and have the prescription routed to another pharmacy.  Your safety is important to us. If you have drug allergies check your prescription carefully.   For the next 24 hours you can use MyChart to ask questions about today's visit, request a non-urgent call back, or ask for a work or school excuse. You will get an email in the next two days asking about your experience. I hope that your e-visit has been valuable and will speed your recovery.  I provided 5 minutes of non face-to-face time during this encounter for chart review and documentation.   

## 2021-01-05 ENCOUNTER — Telehealth: Payer: Medicaid Other | Admitting: Nurse Practitioner

## 2021-01-05 DIAGNOSIS — J069 Acute upper respiratory infection, unspecified: Secondary | ICD-10-CM

## 2021-01-05 MED ORDER — BENZONATATE 100 MG PO CAPS: 100.0000 mg | ORAL_CAPSULE | Freq: Three times a day (TID) | ORAL | 0 refills | Status: DC | PRN

## 2021-01-05 MED ORDER — FLUTICASONE PROPIONATE 50 MCG/ACT NA SUSP: 2.0000 | Freq: Every day | NASAL | 6 refills | Status: DC

## 2021-01-05 NOTE — Progress Notes (Signed)

## 2021-01-16 ENCOUNTER — Telehealth: Payer: Self-pay

## 2021-01-16 ENCOUNTER — Telehealth: Payer: Medicaid Other | Admitting: Physician Assistant

## 2021-01-16 DIAGNOSIS — R102 Pelvic and perineal pain: Secondary | ICD-10-CM

## 2021-01-16 NOTE — Progress Notes (Signed)
Based on what you shared with me, I feel your condition warrants further evaluation and I recommend that you be seen in a face to face visit.  Giving your pelvic pain you need an in-person evaluation ASAP, either with PCP or your GYN. If symptoms are severe, you need to seek care at local ER.    NOTE: There will be NO CHARGE for this eVisit   If you are having a true medical emergency please call 911.      For an urgent face to face visit, [place] [place] has six urgent care centers for your convenience:     [facility] at [facility] Directions [phone] [address] [place], [place] [address]    [facility]) Get Driving Directions [phone] [address] [place], [place] [address]  [facility] Center [facility]) Get Driving Directions [phone] [address] [place],  [place]  [address]  [facility] Health Urgent Care at [facility] Get Driving Directions [phone] [address] [place], [place] [address]   [facility] Urgent Care at [facility] Get Driving Directions  [phone] [address] [place], [place] [address]   [facility] Health Urgent Care at [facility] Directions [phone] [address] [place], [place] [address]  Your MyChart E-visit questionnaire answers were reviewed by a board certified advanced clinical practitioner to complete your personal care plan based on your specific symptoms.  Thank you for using e-Visits.

## 2021-01-16 NOTE — Telephone Encounter (Signed)
Pt called c/o abnormal bleeding on depo. States she has had prolonged menstrual cycles on depo for the last 6 months. States she has had 2 week cycles consistently since July until November when she did not have a cycle at all. Pt states she had some abnormally heavy bleeding last week with large clotting and pelvic pain where the clots were the size of her palm. Once the clots passed the pain went away. Asked patient if she had taken a pregnancy test and she said she had taken 2 which were both negative. Pt does state she had unprotected intercourse. However after reviewing patient's chart she has gotten all her Depo injections on time. Will have patient come in for evaluation. Appointment given for first available. Pt agreed and verbalized understanding.

## 2021-01-17 ENCOUNTER — Other Ambulatory Visit: Payer: Self-pay

## 2021-01-17 ENCOUNTER — Inpatient Hospital Stay (HOSPITAL_COMMUNITY)
Admission: AD | Admit: 2021-01-17 | Discharge: 2021-01-17 | Disposition: A | Discharge status: Home / Self Care | Payer: BC Managed Care – PPO | Attending: Obstetrics & Gynecology | Admitting: Obstetrics & Gynecology

## 2021-01-17 DIAGNOSIS — Z793 Long term (current) use of hormonal contraceptives: Secondary | ICD-10-CM | POA: Insufficient documentation

## 2021-01-17 DIAGNOSIS — N939 Abnormal uterine and vaginal bleeding, unspecified: Secondary | ICD-10-CM | POA: Diagnosis not present

## 2021-01-17 DIAGNOSIS — N921 Excessive and frequent menstruation with irregular cycle: Secondary | ICD-10-CM | POA: Diagnosis not present

## 2021-01-17 DIAGNOSIS — Z3202 Encounter for pregnancy test, result negative: Secondary | ICD-10-CM | POA: Diagnosis present

## 2021-01-17 LAB — POCT PREGNANCY, URINE: Preg Test, Ur: NEGATIVE

## 2021-01-17 NOTE — MAU Note (Signed)
Ashley [name] is a 25 y.o. at Unknown here in MAU reporting: started having heavy bleeding on Friday. States yesterday she passed a large clot and saw some mucus discharge. Unsure if she was having a miscarriage. No bleeding today. Having some lower abdominal aching.   LMP: 11/19/2020  Onset of complaint: ongoing  Pain score: 3/10  Vitals:   01/17/21 0835  BP: 111/70  Pulse: 78  Resp: 16  Temp: 98.5 F (36.9 C)  SpO2: 98%     Lab orders placed from triage: upt

## 2021-01-17 NOTE — MAU Provider Note (Signed)
Event Date/Time   First Provider Initiated Contact with Patient 01/17/21 [phone]      S [name] is a 25 y.o. [id number] patient who presents to MAU today with complaint of vaginal bleeding. Currently using depo provera for contraception & has not missed or been late for any doses. Reports heavy vaginal bleeding over the weekend. Saturated a medium sized pad per hour and passed come blood clots. States currently the bleeding is minimal. Denies dizziness, weakness, or palpitations. Has appointment scheduled for next week with [facility] to address this topic. Has had negative pregnancy tests at home.    O BP 111/70 (BP Location: Right Arm)    Pulse 78    Temp 98.5 F (36.9 C) (Oral)    Resp 16    Wt 68.3 kg    SpO2 98% Comment: room air   BMI 23.57 kg/m  Physical Exam Vitals and nursing note reviewed.  Constitutional:      General: She is not in acute distress.    Appearance: She is well-developed. She is not diaphoretic.  HENT:     Head: Normocephalic and atraumatic.  Eyes:     General: No scleral icterus.    Extraocular Movements: Extraocular movements intact.     Pupils: Pupils are equal, round, and reactive to light.  Pulmonary:     Effort: Pulmonary effort is normal. No respiratory distress.  Neurological:     General: No focal deficit present.     Mental Status: She is alert.  Psychiatric:        Mood and Affect: Mood normal.        Behavior: Behavior normal.    A Medical screening exam complete 1. Breakthrough bleeding on depo provera   2. Negative pregnancy test      P Discharge from MAU in stable condition Patient instructed to keep scheduled appointment next week Reviewed reasons to go to the ED   Judeth Horn, NP 01/17/2021 8:45 AM

## 2021-01-17 NOTE — Discharge Instructions (Signed)
Return to care  If you have heavier bleeding that soaks through more than 2 pads per hour for an hour or more If you bleed so much that you feel like you might pass out or you do pass out If you have significant abdominal pain that is not improved with Tylenol   

## 2021-01-24 ENCOUNTER — Ambulatory Visit (INDEPENDENT_AMBULATORY_CARE_PROVIDER_SITE_OTHER): Payer: BC Managed Care – PPO | Admitting: Women's Health

## 2021-01-24 ENCOUNTER — Encounter: Payer: Self-pay | Admitting: Women's Health

## 2021-01-24 ENCOUNTER — Other Ambulatory Visit: Payer: Self-pay

## 2021-01-24 VITALS — BP 122/72 | HR 87 | Ht 68.0 in | Wt 150.0 lb

## 2021-01-24 DIAGNOSIS — N926 Irregular menstruation, unspecified: Secondary | ICD-10-CM

## 2021-01-24 MED ORDER — LEVONORGESTREL-ETHINYL ESTRAD 0.1-20 MG-MCG PO TABS: 1.0000 | ORAL_TABLET | Freq: Every day | ORAL | 2 refills | Status: DC

## 2021-01-24 NOTE — Progress Notes (Signed)
GYN Pt presents for AUB on DEPO, she bleeds for 12-15 days every month since December 2021. Last month she  did not have a cycle.  A week ago she passed large clots.

## 2021-01-24 NOTE — Patient Instructions (Addendum)
For your heavy menses and cramping, take EITHER Advil or Aleve continuously for the first 7days of your cycle. This should lessen your bleeding and pain. If there is not sufficient improvement in 78mo, RTC for additional treatment options. You can purchase these medications over the counter without a prescription. Be sure not to exceed the amount recommended in the instructions on the medication bottle.

## 2021-01-24 NOTE — Progress Notes (Signed)
History:  [name] is a 25 y.o. [id number] who presents to clinic today for irregular bleeding while on Depo. Patient has been using Depo since 01/2020. Patient also used the Depo back in 2017, but got off of it. Patient reports since December she has monthly bleeding lasting 10-15 days, but reports in November she did not have any bleeding. Patient also reports significant cramping with passage of a large clot and she says the cramping resolved after passage of the clot. Patient reports passage of the clot was 01/16/2021 and she has continued to bleed since then. Patient reports on the days she bleeds, she reports using 1 super tampon and changing every 1.5 hours if she experiences leaking around the tampon, and patient reports this is all 10-15 days of her bleeding. Patient reports she tried taking OCP to control the bleeding over the summer, was given a pill, and reports that it worked, but she only took one pack because it was making her sick. Patient with normal pelvic US 08/10/2019.  Patient reports with the Depo she was told to expect that she would not have a period on Depo or it would be very light.  Patient reports she previously used pills, which made her nauseous, Nexplanon, which also caused irregular bleeding. Patient states that her goal is to lighten her bleeding as she bleeds very heavily when she is not on birth control  PMH: HSV, depression PSH: knee surgery (12/09/2019) Allergies: PCN Meds: Depo, Valtrex  The following portions of the patient's history were reviewed and updated as appropriate: allergies, current medications, family history, past medical history, social history, past surgical history and problem list.  Review of Systems:  Review of Systems  All other systems reviewed and are negative.    Objective:  Physical Exam BP 122/72    Pulse 87    Ht 5\' 8"  (1.727 m)    Wt 150 lb (68 kg)    LMP 01/12/2021 (Exact Date)    BMI 22.81 kg/m   Physical  Exam Vitals and nursing note reviewed.  Constitutional:      Appearance: Normal appearance.  HENT:     Head: Normocephalic and atraumatic.  Pulmonary:     Effort: Pulmonary effort is normal.  Neurological:     Mental Status: She is alert and oriented to person, place, and time.  Psychiatric:        Mood and Affect: Mood normal.        Behavior: Behavior normal.        Thought Content: Thought content normal.        Judgment: Judgment normal.   Labs and Imaging No results found for this or any previous visit (from the past 24 hour(s)).  No results found.  Health Maintenance Due  Topic Date Due   COVID-19 Vaccine (1) Never done   TETANUS/TDAP  Never done   INFLUENZA VACCINE  09/04/2020     Assessment & Plan:  1. Irregular bleeding - CBC - Ferritin - 11/04/2020 PELVIC COMPLETE WITH TRANSVAGINAL; Future - levonorgestrel-ethinyl estradiol (AVIANE) 0.1-20 MG-MCG tablet; Take 1 tablet by mouth daily.  Dispense: 28 tablet; Refill: 2 - pt to let 10-31-2006 know if pills work, states has been dealing with heavy periods since high school, interested in surgical intervention if new pills do not work and no cause is identified on [place].   Approximately 10 minutes of total time was spent with this patient on 15 minutes.  [place], NP 01/24/2021 3:53 PM

## 2021-01-25 LAB — CBC
Hematocrit: 38.1 % (ref 34.0–46.6)
Hemoglobin: 12.5 g/dL (ref 11.1–15.9)
MCH: 29.4 pg (ref 26.6–33.0)
MCHC: 32.8 g/dL (ref 31.5–35.7)
MCV: 90 fL (ref 79–97)
Platelets: 282 10*3/uL (ref 150–450)
RBC: 4.25 x10E6/uL (ref 3.77–5.28)
RDW: 13.1 % (ref 11.7–15.4)
WBC: 7.6 10*3/uL (ref 3.4–10.8)

## 2021-01-25 LAB — FERRITIN: Ferritin: 49 ng/mL (ref 15–150)

## 2021-01-31 ENCOUNTER — Other Ambulatory Visit: Payer: Self-pay

## 2021-01-31 ENCOUNTER — Ambulatory Visit (HOSPITAL_BASED_OUTPATIENT_CLINIC_OR_DEPARTMENT_OTHER)
Admission: RE | Admit: 2021-01-31 | Discharge: 2021-01-31 | Disposition: A | Discharge status: Home / Self Care | Payer: BC Managed Care – PPO | Source: Ambulatory Visit | Attending: Women's Health | Admitting: Women's Health

## 2021-01-31 DIAGNOSIS — N926 Irregular menstruation, unspecified: Secondary | ICD-10-CM | POA: Diagnosis present

## 2021-02-01 ENCOUNTER — Telehealth: Payer: BC Managed Care – PPO | Admitting: Physician Assistant

## 2021-02-01 DIAGNOSIS — J069 Acute upper respiratory infection, unspecified: Secondary | ICD-10-CM

## 2021-02-01 MED ORDER — BENZONATATE 100 MG PO CAPS: 100.0000 mg | ORAL_CAPSULE | Freq: Three times a day (TID) | ORAL | 0 refills | Status: DC | PRN

## 2021-02-01 NOTE — Progress Notes (Signed)

## 2021-02-01 NOTE — Progress Notes (Signed)
I have spent 5 minutes in review of e-visit questionnaire, review and updating patient chart, medical decision making and response to patient.   Jodine Muchmore Cody Korrey Schleicher, PA-C    

## 2021-03-03 ENCOUNTER — Other Ambulatory Visit: Payer: Self-pay

## 2021-03-03 ENCOUNTER — Emergency Department (HOSPITAL_BASED_OUTPATIENT_CLINIC_OR_DEPARTMENT_OTHER)
Admission: EM | Admit: 2021-03-03 | Discharge: 2021-03-03 | Disposition: A | Discharge status: Home / Self Care | Payer: Medicaid Other | Attending: Emergency Medicine | Admitting: Emergency Medicine

## 2021-03-03 ENCOUNTER — Encounter (HOSPITAL_BASED_OUTPATIENT_CLINIC_OR_DEPARTMENT_OTHER): Payer: Self-pay

## 2021-03-03 DIAGNOSIS — R112 Nausea with vomiting, unspecified: Secondary | ICD-10-CM | POA: Diagnosis present

## 2021-03-03 DIAGNOSIS — R1030 Lower abdominal pain, unspecified: Secondary | ICD-10-CM | POA: Insufficient documentation

## 2021-03-03 LAB — CBC WITH DIFFERENTIAL/PLATELET
Abs Immature Granulocytes: 0.03 10*3/uL (ref 0.00–0.07)
Basophils Absolute: 0.1 10*3/uL (ref 0.0–0.1)
Basophils Relative: 1 %
Eosinophils Absolute: 0 10*3/uL (ref 0.0–0.5)
Eosinophils Relative: 0 %
HCT: 38.2 % (ref 36.0–46.0)
Hemoglobin: 12.9 g/dL (ref 12.0–15.0)
Immature Granulocytes: 0 %
Lymphocytes Relative: 17 %
Lymphs Abs: 1.5 10*3/uL (ref 0.7–4.0)
MCH: 30 pg (ref 26.0–34.0)
MCHC: 33.8 g/dL (ref 30.0–36.0)
MCV: 88.8 fL (ref 80.0–100.0)
Monocytes Absolute: 0.6 10*3/uL (ref 0.1–1.0)
Monocytes Relative: 7 %
Neutro Abs: 6.6 10*3/uL (ref 1.7–7.7)
Neutrophils Relative %: 75 %
Platelets: 311 10*3/uL (ref 150–400)
RBC: 4.3 MIL/uL (ref 3.87–5.11)
RDW: 13.3 % (ref 11.5–15.5)
WBC: 8.8 10*3/uL (ref 4.0–10.5)
nRBC: 0 % (ref 0.0–0.2)

## 2021-03-03 LAB — URINALYSIS, ROUTINE W REFLEX MICROSCOPIC
Bilirubin Urine: NEGATIVE
Glucose, UA: NEGATIVE mg/dL
Hgb urine dipstick: NEGATIVE
Ketones, ur: NEGATIVE mg/dL
Leukocytes,Ua: NEGATIVE
Nitrite: NEGATIVE
Protein, ur: NEGATIVE mg/dL
Specific Gravity, Urine: 1.02 (ref 1.005–1.030)
pH: 8.5 — ABNORMAL HIGH (ref 5.0–8.0)

## 2021-03-03 LAB — COMPREHENSIVE METABOLIC PANEL
ALT: 15 U/L (ref 0–44)
AST: 24 U/L (ref 15–41)
Albumin: 4.3 g/dL (ref 3.5–5.0)
Alkaline Phosphatase: 54 U/L (ref 38–126)
Anion gap: 11 (ref 5–15)
BUN: 8 mg/dL (ref 6–20)
CO2: 19 mmol/L — ABNORMAL LOW (ref 22–32)
Calcium: 9.4 mg/dL (ref 8.9–10.3)
Chloride: 109 mmol/L (ref 98–111)
Creatinine, Ser: 0.82 mg/dL (ref 0.44–1.00)
GFR, Estimated: >60 mL/min (ref >60)
Glucose, Bld: 88 mg/dL (ref 70–99)
Potassium: 3.6 mmol/L (ref 3.5–5.1)
Sodium: 139 mmol/L (ref 135–145)
Total Bilirubin: 0.5 mg/dL (ref 0.3–1.2)
Total Protein: 8.2 g/dL — ABNORMAL HIGH (ref 6.5–8.1)

## 2021-03-03 LAB — PREGNANCY, URINE: Preg Test, Ur: NEGATIVE

## 2021-03-03 LAB — LIPASE, BLOOD: Lipase: 31 U/L (ref 11–51)

## 2021-03-03 MED ORDER — DICYCLOMINE HCL 10 MG PO CAPS
10.0000 mg | ORAL_CAPSULE | Freq: Once | ORAL | Status: AC
Start: 2021-03-03 — End: 2021-03-03
  Administered 2021-03-03: 10 mg via ORAL
  Filled 2021-03-03: qty 1

## 2021-03-03 MED ORDER — ONDANSETRON 4 MG PO TBDP
4.0000 mg | ORAL_TABLET | Freq: Once | ORAL | Status: AC
Start: 2021-03-03 — End: 2021-03-03
  Administered 2021-03-03: 4 mg via ORAL
  Filled 2021-03-03: qty 1

## 2021-03-03 MED ORDER — KETOROLAC TROMETHAMINE 15 MG/ML IJ SOLN
15.0000 mg | Freq: Once | INTRAMUSCULAR | Status: AC
Start: 2021-03-03 — End: 2021-03-03
  Administered 2021-03-03: 15 mg via INTRAVENOUS
  Filled 2021-03-03: qty 1

## 2021-03-03 MED ORDER — HYDROCODONE-ACETAMINOPHEN 5-325 MG PO TABS
1.0000 | ORAL_TABLET | Freq: Once | ORAL | Status: AC
  Administered 2021-03-03: 1 via ORAL
  Filled 2021-03-03: qty 1

## 2021-03-03 MED ORDER — DICYCLOMINE HCL 20 MG PO TABS: 20.0000 mg | ORAL_TABLET | Freq: Two times a day (BID) | ORAL | 0 refills | Status: DC | PRN

## 2021-03-03 MED ORDER — SODIUM CHLORIDE 0.9 % IV BOLUS
1000.0000 mL | Freq: Once | INTRAVENOUS | Status: AC
  Administered 2021-03-03: 1000 mL via INTRAVENOUS

## 2021-03-03 MED ORDER — ONDANSETRON HCL 4 MG/2ML IJ SOLN
4.0000 mg | Freq: Once | INTRAMUSCULAR | Status: AC
  Administered 2021-03-03: 4 mg via INTRAVENOUS
  Filled 2021-03-03: qty 2

## 2021-03-03 MED ORDER — ONDANSETRON 4 MG PO TBDP: 4.0000 mg | ORAL_TABLET | Freq: Three times a day (TID) | ORAL | 0 refills | Status: DC | PRN

## 2021-03-03 NOTE — ED Triage Notes (Signed)
Pt BIB [facility] with c/o abdominal pain with n/v this am,, etoh last night. New Birth control

## 2021-03-03 NOTE — ED Notes (Signed)
Fluid challenge tolerated well 

## 2021-03-03 NOTE — Discharge Instructions (Signed)
Use Zofran as needed for nausea or vomiting. Use Tylenol ibuprofen as needed for pain. Use Bentyl to help with abdominal cramping and spasm. Use heating pads to help with discomfort. Follow-up with your OB/GYN regarding the side effects of your birth control Return to the emergency room if you develop persistent vomiting despite medication, blood in your vomit, severe worsening pain, or any new, worsening, or concerning symptoms.

## 2021-03-03 NOTE — ED Notes (Signed)
States," I am feeling better" Ambulatory to [facility], u/a obtained

## 2021-03-03 NOTE — ED Provider Notes (Signed)
[facility] EMERGENCY DEPARTMENT Provider Note   CSN: [id number] Arrival date & time: 03/03/21  1051     History  No chief complaint on file.   Ashley [name] is a 26 y.o. female presenting for evaluation of vomiting, abdominal pain.  Patient states she has had persistent nausea and vomiting today.  She also reports severe lower abdominal pain which she describes as an intermittent cramping.  She drank last night, but denies significant more alcohol than what she normally does.  She does state for the past month, she has been on a new birth control pill which has been causing significant side effects including paresthesias, chest pain, and intermittent abdominal pain.  She has not had any vaginal bleeding since starting this birth control.  She denies fevers or chills.  No new cough.  No upper abdominal pain.  No urinary symptoms or abnormal bowel movements.  HPI     Home Medications Prior to Admission medications   Medication Sig Start Date End Date Taking? Authorizing Provider  dicyclomine (BENTYL) 20 MG tablet Take 1 tablet (20 mg total) by mouth 2 (two) times daily as needed for spasms. 03/03/21  Yes Amen Dargis, PA-C  ondansetron (ZOFRAN-ODT) 4 MG disintegrating tablet Take 1 tablet (4 mg total) by mouth every 8 (eight) hours as needed for nausea or vomiting. 03/03/21  Yes Louellen Haldeman, PA-C  benzonatate (TESSALON) 100 MG capsule Take 1 capsule (100 mg total) by mouth 3 (three) times daily as needed for cough. 02/01/21   Waldon Merl, PA-C  fluticasone (FLONASE) 50 MCG/ACT nasal spray Place 2 sprays into both nostrils daily. 01/05/21   Bennie Pierini, FNP  levonorgestrel-ethinyl estradiol (AVIANE) 0.1-20 MG-MCG tablet Take 1 tablet by mouth daily. 01/24/21   Nugent, Odie Sera, NP  medroxyPROGESTERone (DEPO-PROVERA) 150 MG/ML injection Inject 1 mL (150 mg total) into the muscle every 3 (three) months. 10/10/20   Brock Bad, MD  ondansetron (ZOFRAN) 4  MG tablet Take 1 tablet (4 mg total) by mouth every 8 (eight) hours as needed for nausea or vomiting. Patient not taking: Reported on 01/24/2021 11/07/20   Margaretann Loveless, PA-C  Prenat-Fe Poly-Methfol-FA-DHA (VITAFOL ULTRA) 29-0.6-0.4-200 MG CAPS Take 1 capsule by mouth daily before breakfast. 06/12/20   Brock Bad, MD  valACYclovir (VALTREX) 500 MG tablet TAKE AS DIRECTED FOR 3 DAYS AS NEEDED FOR EACH OUTBREAK 06/12/20   Brock Bad, MD      Allergies    Penicillins    Review of Systems   Review of Systems  Gastrointestinal:  Positive for abdominal pain, nausea and vomiting.  All other systems reviewed and are negative.  Physical Exam Updated Vital Signs BP 122/82 (BP Location: Right Arm)    Pulse 86    Temp 98.2 F (36.8 C) (Oral)    Resp 18    Ht 5\' 8"  (1.727 m)    Wt 68 kg    SpO2 100%    BMI 22.81 kg/m  Physical Exam Vitals and nursing note reviewed.  Constitutional:      General: She is not in acute distress.    Appearance: Normal appearance.  HENT:     Head: Normocephalic and atraumatic.  Eyes:     Conjunctiva/sclera: Conjunctivae normal.     Pupils: Pupils are equal, round, and reactive to light.  Cardiovascular:     Rate and Rhythm: Normal rate and regular rhythm.     Pulses: Normal pulses.  Pulmonary:     Effort: Pulmonary  effort is normal. No respiratory distress.     Breath sounds: Normal breath sounds. No wheezing.     Comments: Speaking in full sentences.  Clear lung sounds in all fields. Abdominal:     General: There is no distension.     Palpations: Abdomen is soft. There is no mass.     Tenderness: There is abdominal tenderness. There is no guarding or rebound.     Comments: Ttp of lower abd  Musculoskeletal:        General: Normal range of motion.     Cervical back: Normal range of motion and neck supple.  Skin:    General: Skin is warm and dry.     Capillary Refill: Capillary refill takes less than 2 seconds.  Neurological:     Mental  Status: She is alert and oriented to person, place, and time.  Psychiatric:        Mood and Affect: Mood and affect normal.        Speech: Speech normal.        Behavior: Behavior normal.    ED Results / Procedures / Treatments   Labs (all labs ordered are listed, but only abnormal results are displayed) Labs Reviewed  COMPREHENSIVE METABOLIC PANEL - Abnormal; Notable for the following components:      Result Value   CO2 19 (*)    Total Protein 8.2 (*)    All other components within normal limits  URINALYSIS, ROUTINE W REFLEX MICROSCOPIC - Abnormal; Notable for the following components:   pH 8.5 (*)    All other components within normal limits  CBC WITH DIFFERENTIAL/PLATELET  LIPASE, BLOOD  PREGNANCY, URINE    EKG None  Radiology No results found.  Procedures Procedures    Medications Ordered in ED Medications  HYDROcodone-acetaminophen (NORCO/VICODIN) 5-325 MG per tablet 1 tablet (has no administration in time range)  ondansetron (ZOFRAN-ODT) disintegrating tablet 4 mg (4 mg Oral Given 03/03/21 1115)  sodium chloride 0.9 % bolus 1,000 mL ( Intravenous Stopped 03/03/21 1309)  ondansetron (ZOFRAN) injection 4 mg (4 mg Intravenous Given 03/03/21 1229)  ketorolac (TORADOL) 15 MG/ML injection 15 mg (15 mg Intravenous Given 03/03/21 1233)  dicyclomine (BENTYL) capsule 10 mg (10 mg Oral Given 03/03/21 1312)    ED Course/ Medical Decision Making/ A&P                           Medical Decision Making Amount and/or Complexity of Data Reviewed Labs: ordered.  Risk Prescription drug management.    This patient presents to the ED for concern of nausea, vomiting, abdominal pain.  This involves a number of treatment options, and is a complaint that carries with it a moderate risk of complications and morbidity.  The differential diagnosis includes pancreatitis, alcohol poisoning, gastritis, menstrual cramps, fibroids, viral GI illness, gallbladder etiology, UTI/pyelo, kidney  stone   Additional history: Reviewed OB/GYN notes.  Reviewed recent switch from Depo to a new birth control due to irregular and heavy and painful periods.  Lab Tests:  I ordered, and personally interpreted labs.  The pertinent results include: No leukocytosis.  Kidney, liver, pancreatic function normal.  Urine without infection, pregnancy negative   Medicines ordered:  I ordered medication including Zofran, fluids, toradol, Bentyl for symptom control Reevaluation of the patient after these medicines showed that the patient improved Symptoms significantly improved.  Patient able to tolerate p.o. without vomiting, although does report after eating she has some mild  increased pain  Test Considered:  As patient is afebrile, does not have leukocytosis, and pain resolved with symptoms, low suspicion for intra-abdominal infection, perforation, obstruction, surgical abdomen.  Do not feel that she needs emergent CT scan at this   Disposition:  After consideration of the diagnostic results and the patients response to treatment, I feel that the patent would benefit from symptomatic management and PCP follow-up as needed.  Discussed findings with patient.  Discussed overall reassuring work-up.  Discussed that this may be due to her alcohol from last night, or may be related to a viral illness/stomach bug.  Encouraged symptomatic treatment regardless and follow-up with PCP/OB/GYN as needed.  At this time, patient appears safe for discharge.  Return precautions given.  Patient states she understands and agrees to plan.  Final Clinical Impression(s) / ED Diagnoses Final diagnoses:  Nausea and vomiting, unspecified vomiting type  Lower abdominal pain    Rx / DC Orders ED Discharge Orders          Ordered    dicyclomine (BENTYL) 20 MG tablet  2 times daily PRN        03/03/21 1430    ondansetron (ZOFRAN-ODT) 4 MG disintegrating tablet  Every 8 hours PRN        03/03/21 1430               Se[place]our[place]avale, [place] FentonSophia, PA-C 03/03/21 1439    Cheryll CockayneHong, Joshua S, MD 03/07/21 [id number]

## 2021-03-03 NOTE — ED Notes (Signed)
Pt given cup of water and cup of ice for PO challenge.

## 2021-03-05 ENCOUNTER — Encounter: Payer: Self-pay | Admitting: Obstetrics

## 2021-03-05 ENCOUNTER — Telehealth: Payer: Medicaid Other | Admitting: Emergency Medicine

## 2021-03-05 ENCOUNTER — Telehealth: Payer: Medicaid Other | Admitting: Physician Assistant

## 2021-03-05 ENCOUNTER — Encounter (HOSPITAL_BASED_OUTPATIENT_CLINIC_OR_DEPARTMENT_OTHER): Payer: Self-pay

## 2021-03-05 ENCOUNTER — Emergency Department (HOSPITAL_BASED_OUTPATIENT_CLINIC_OR_DEPARTMENT_OTHER)
Admission: EM | Admit: 2021-03-05 | Discharge: 2021-03-05 | Disposition: A | Discharge status: Home / Self Care | Payer: Medicaid Other | Attending: Emergency Medicine | Admitting: Emergency Medicine

## 2021-03-05 ENCOUNTER — Other Ambulatory Visit: Payer: Self-pay

## 2021-03-05 DIAGNOSIS — U071 COVID-19: Secondary | ICD-10-CM | POA: Diagnosis not present

## 2021-03-05 DIAGNOSIS — R509 Fever, unspecified: Secondary | ICD-10-CM | POA: Diagnosis present

## 2021-03-05 DIAGNOSIS — J069 Acute upper respiratory infection, unspecified: Secondary | ICD-10-CM

## 2021-03-05 LAB — RESP PANEL BY RT-PCR (FLU A&B, COVID) ARPGX2
Influenza A by PCR: NEGATIVE
Influenza B by PCR: NEGATIVE
SARS Coronavirus 2 by RT PCR: POSITIVE — AB

## 2021-03-05 MED ORDER — SPACER/AERO-HOLDING CHAMBERS DEVI: 1.0000 | 0 refills | Status: DC | PRN

## 2021-03-05 MED ORDER — IBUPROFEN 800 MG PO TABS
800.0000 mg | ORAL_TABLET | Freq: Once | ORAL | Status: AC
  Administered 2021-03-05: 800 mg via ORAL
  Filled 2021-03-05: qty 1

## 2021-03-05 MED ORDER — BENZONATATE 100 MG PO CAPS: 100.0000 mg | ORAL_CAPSULE | Freq: Two times a day (BID) | ORAL | 0 refills | Status: DC | PRN

## 2021-03-05 MED ORDER — AZELASTINE HCL 0.1 % NA SOLN: 2.0000 | Freq: Two times a day (BID) | NASAL | 0 refills | Status: DC

## 2021-03-05 MED ORDER — ALBUTEROL SULFATE HFA 108 (90 BASE) MCG/ACT IN AERS: 2.0000 | INHALATION_SPRAY | Freq: Four times a day (QID) | RESPIRATORY_TRACT | 0 refills | Status: DC | PRN

## 2021-03-05 NOTE — Progress Notes (Signed)
Based on what you shared with me, I feel your condition warrants further evaluation and I recommend that you be seen in a face to face visit.   NOTE: There will be NO CHARGE for this eVisit   If you are having a true medical emergency please call 911.      For an urgent face to face visit, [facility] has six urgent care centers for your convenience:     [facility] [place] Get Driving Directions [phone] [address] [place], [place] [address]    [facility] ([place]) Get Driving Directions [phone] [address] [place], [place] [address]  [facility] [facility] [place]) Get Driving Directions [phone] [address] [place],  [place]  [address]  [facility] Get Driving Directions [phone] [address] [place], [place] [address]   [facility] [place] Get Driving Directions  [phone] [address] [place], [place] [address]   [facility] [place] Get Driving Directions [phone] [address] [place],  [address]  Your MyChart E-visit questionnaire answers were reviewed by a board certified advanced clinical practitioner to complete your personal care plan based on your specific symptoms.  Thank you for using e-Visits.   I provided 5 minutes of non face-to-face time during this encounter for chart review and documentation.   

## 2021-03-05 NOTE — ED Triage Notes (Signed)
Pt c/o flu like sx started yesterday-NAD-steady gait 

## 2021-03-05 NOTE — Discharge Instructions (Signed)
If you develop high fever, severe cough or cough with blood, trouble breathing, severe headache, neck pain/stiffness, vomiting, or any other new/concerning symptoms then return to the ER for evaluation  

## 2021-03-05 NOTE — Progress Notes (Signed)
We are sorry that you are not feeling well.  Here is how we plan to help!  Based on your presentation I believe you most likely have A cough due to a virus.  This is called viral bronchitis and is best treated by rest, plenty of fluids and control of the cough.  You may use Ibuprofen or Tylenol as directed to help your symptoms.     In addition you may use A prescription cough medication called Tessalon Perles 100mg . You may take 1-2 capsules every 8 hours as needed for your cough.  I have also prescribed an albuterol inhaler for you to use. You can use it every 4 to 6 hours if needed for wheezing or shortness of breath. Please use it with the spacer.   I have also prescribed azelastine nasal spray to help with your congestion. I recommend over the counter Mucinex and nasal saline spray, too.    From your responses in the eVisit questionnaire you describe inflammation in the upper respiratory tract which is causing a significant cough.  This is commonly called Bronchitis and has four common causes:   Allergies Viral Infections Acid Reflux Bacterial Infection Allergies, viruses and acid reflux are treated by controlling symptoms or eliminating the cause. An example might be a cough caused by taking certain blood pressure medications. You stop the cough by changing the medication. Another example might be a cough caused by acid reflux. Controlling the reflux helps control the cough.  USE OF BRONCHODILATOR ("RESCUE") INHALERS: There is a risk from using your bronchodilator too frequently.  The risk is that over-reliance on a medication which only relaxes the muscles surrounding the breathing tubes can reduce the effectiveness of medications prescribed to reduce swelling and congestion of the tubes themselves.  Although you feel brief relief from the bronchodilator inhaler, your asthma may actually be worsening with the tubes becoming more swollen and filled with mucus.  This can delay other crucial  treatments, such as oral steroid medications. If you need to use a bronchodilator inhaler daily, several times per day, you should discuss this with your provider.  There are probably better treatments that could be used to keep your asthma under control.     HOME CARE Only take medications as instructed by your medical team. Complete the entire course of an antibiotic. Drink plenty of fluids and get plenty of rest. Avoid close contacts especially the very young and the elderly Cover your mouth if you cough or cough into your sleeve. Always remember to wash your hands A steam or ultrasonic humidifier can help congestion.   GET HELP RIGHT AWAY IF: You develop worsening fever. You become short of breath You cough up blood. Your symptoms persist after you have completed your treatment plan MAKE SURE YOU  Understand these instructions. Will watch your condition. Will get help right away if you are not doing well or get worse.    Thank you for choosing an e-visit.  Your e-visit answers were reviewed by a board certified advanced clinical practitioner to complete your personal care plan. Depending upon the condition, your plan could have included both over the counter or prescription medications.  Please review your pharmacy choice. Make sure the pharmacy is open so you can pick up prescription now. If there is a problem, you may contact your provider through and have the prescription routed to another pharmacy.  Your safety is important to [employer]. If you have drug allergies check your prescription carefully.  For the next 24 hours you can use MyChart to ask questions about today's visit, request a non-urgent call back, or ask for a work or school excuse. You will get an email in the next two days asking about your experience. I hope that your e-visit has been valuable and will speed your recovery.  I have spent 5 minutes in review of e-visit questionnaire, review and updating  patient chart, medical decision making and response to patient.   Rica Mast, PhD, FNP-BC

## 2021-03-05 NOTE — ED Provider Notes (Signed)
[facility] EMERGENCY DEPARTMENT Provider Note   CSN: [id number] Arrival date & time: 03/05/21  1858     History  Chief Complaint  Patient presents with   Cough    Ashley [name] is a 26 y.o. female.  HPI 26 year old female presents with fever and cough.  Symptoms started yesterday.  She was here 2 days ago but states right after she left she was feeling fine.  Woke up yesterday feeling poor with cough, fever, scratchy throat, headache and some congestion in the chest.  Seem to be worse today.  Took a home COVID test that was negative.  She denies any significant past medical history.   Home Medications Prior to Admission medications   Medication Sig Start Date End Date Taking? Authorizing Provider  albuterol (VENTOLIN HFA) 108 (90 Base) MCG/ACT inhaler Inhale 2 puffs into the lungs every 6 (six) hours as needed for wheezing or shortness of breath. 03/05/21   Cathlyn Parsons, NP  azelastine (ASTELIN) 0.1 % nasal spray Place 2 sprays into both nostrils 2 (two) times daily. Use in each nostril as directed 03/05/21   Cathlyn Parsons, NP  benzonatate (TESSALON) 100 MG capsule Take 1 capsule (100 mg total) by mouth 2 (two) times daily as needed for cough. 03/05/21   Cathlyn Parsons, NP  dicyclomine (BENTYL) 20 MG tablet Take 1 tablet (20 mg total) by mouth 2 (two) times daily as needed for spasms. 03/03/21   Caccavale, Sophia, PA-C  fluticasone (FLONASE) 50 MCG/ACT nasal spray Place 2 sprays into both nostrils daily. 01/05/21   Bennie Pierini, FNP  levonorgestrel-ethinyl estradiol (AVIANE) 0.1-20 MG-MCG tablet Take 1 tablet by mouth daily. 01/24/21   Nugent, Odie Sera, NP  medroxyPROGESTERone (DEPO-PROVERA) 150 MG/ML injection Inject 1 mL (150 mg total) into the muscle every 3 (three) months. 10/10/20   Brock Bad, MD  ondansetron (ZOFRAN) 4 MG tablet Take 1 tablet (4 mg total) by mouth every 8 (eight) hours as needed for nausea or vomiting. Patient not taking: Reported on  01/24/2021 11/07/20   Margaretann Loveless, PA-C  ondansetron (ZOFRAN-ODT) 4 MG disintegrating tablet Take 1 tablet (4 mg total) by mouth every 8 (eight) hours as needed for nausea or vomiting. 03/03/21   Caccavale, Sophia, PA-C  Prenat-Fe Poly-Methfol-FA-DHA (VITAFOL ULTRA) 29-0.6-0.4-200 MG CAPS Take 1 capsule by mouth daily before breakfast. 06/12/20   Brock Bad, MD  Spacer/Aero-Holding Deretha Emory DEVI 1 each by Does not apply route as needed. 03/05/21   Cathlyn Parsons, NP  valACYclovir (VALTREX) 500 MG tablet TAKE AS DIRECTED FOR 3 DAYS AS NEEDED FOR EACH OUTBREAK 06/12/20   Brock Bad, MD      Allergies    Penicillins    Review of Systems   Review of Systems  Constitutional:  Positive for fever.  HENT:  Positive for sore throat.   Respiratory:  Positive for cough. Negative for shortness of breath.   Neurological:  Positive for headaches.   Physical Exam Updated Vital Signs BP 137/78 (BP Location: Left Arm)    Pulse 100    Temp (!) 101.6 F (38.7 C) (Oral)    Resp 18    LMP 01/12/2021 Comment: stopped BCP 2 dyas ago   SpO2 99%  Physical Exam Vitals and nursing note reviewed.  Constitutional:      General: She is not in acute distress.    Appearance: She is well-developed. She is not ill-appearing or diaphoretic.  HENT:     Head: Normocephalic  and atraumatic.  Cardiovascular:     Rate and Rhythm: Normal rate and regular rhythm.     Heart sounds: Normal heart sounds.  Pulmonary:     Effort: Pulmonary effort is normal.     Breath sounds: Normal breath sounds. No wheezing, rhonchi or rales.  Abdominal:     General: There is no distension.     Tenderness: There is no abdominal tenderness.  Skin:    General: Skin is warm and dry.  Neurological:     Mental Status: She is alert.    ED Results / Procedures / Treatments   Labs (all labs ordered are listed, but only abnormal results are displayed) Labs Reviewed  RESP PANEL BY RT-PCR (FLU A&B, COVID) ARPGX2 - Abnormal;  Notable for the following components:      Result Value   SARS Coronavirus 2 by RT PCR POSITIVE (*)    All other components within normal limits    EKG None  Radiology No results found.  Procedures Procedures    Medications Ordered in ED Medications  ibuprofen (ADVIL) tablet 800 mg (800 mg Oral Given 03/05/21 1915)    ED Course/ Medical Decision Making/ A&P                            Patient is overall well-appearing.  She appears to have COVID-19.  She was here 2 days ago for GI symptoms but that seems to have resolved.  Unclear if that was the true onset of her symptoms.  However she is otherwise a healthy 26 year old who does not really meet any criteria to get Paxlovid.  Otherwise, we discussed supportive care.  We will give her a work note.  She has a fever but her vitals are otherwise normal and I do not think x-ray or further work-up is needed.  I did review her chart and she contacted her family practice practitioner who had a ED visit and recommended she get seen in person due to her cough.  However I do not think x-ray is needed as above.        Final Clinical Impression(s) / ED Diagnoses Final diagnoses:  COVID-19    Rx / DC Orders ED Discharge Orders     None         Pricilla Loveless, MD 03/05/21 2107

## 2021-03-20 ENCOUNTER — Encounter: Payer: Self-pay | Admitting: Obstetrics and Gynecology

## 2021-03-20 ENCOUNTER — Other Ambulatory Visit: Payer: Self-pay

## 2021-03-20 ENCOUNTER — Ambulatory Visit (INDEPENDENT_AMBULATORY_CARE_PROVIDER_SITE_OTHER): Payer: Medicaid Other | Admitting: Obstetrics and Gynecology

## 2021-03-20 VITALS — Wt 149.7 lb

## 2021-03-20 DIAGNOSIS — N926 Irregular menstruation, unspecified: Secondary | ICD-10-CM | POA: Diagnosis not present

## 2021-03-20 DIAGNOSIS — N921 Excessive and frequent menstruation with irregular cycle: Secondary | ICD-10-CM

## 2021-03-20 MED ORDER — LEVONORGESTREL-ETHINYL ESTRAD 0.1-20 MG-MCG PO TABS: 1.0000 | ORAL_TABLET | Freq: Every day | ORAL | 11 refills | Status: DC

## 2021-03-20 NOTE — Progress Notes (Signed)
26 yo P2 returning for management of AUB on hormonal contraception. Patient reports usage of depo-provera prior to her last pregnancy and experienced AUB. She used depo-provera for the past 12 months with persistent AUB. AUB was treated with a 2 month trial of continuous COC without improvement. Depo-provera was discontinued a month ago. She reports complete resolution of vaginal bleeding with recent COC prescription of Aviane. However, this was discontinued after a 1.5 weeks secondary to nausea and hot flushes. Patient reports return of vaginal bleeding. Patient reports some dysmenorrhea. She is without any other complaints  Past Medical History:  Diagnosis Date   Medical history non-contributory    Past Surgical History:  Procedure Laterality Date   NO PAST SURGERIES     Family History  Problem Relation Age of Onset   Diabetes Mother    Hypertension Mother    Social History   Tobacco Use   Smoking status: Never   Smokeless tobacco: Never  Vaping Use   Vaping Use: Never used  Substance Use Topics   Alcohol use: Yes    Comment: occ   Drug use: No   ROS See pertinent in HPI. All other systems reviewed and non contributory Weight 149 lb 11.2 oz (67.9 kg), last menstrual period 03/03/2021.  GENERAL: Well-developed, well-nourished female in no acute distress.  NEURO: alert and oriented x 3  US PELVIC COMPLETE WITH TRANSVAGINAL  Result Date: 02/01/2021 CLINICAL DATA:  Heavy, irregular vaginal bleeding. EXAM: TRANSABDOMINAL AND TRANSVAGINAL ULTRASOUND OF PELVIS TECHNIQUE: Both transabdominal and transvaginal ultrasound examinations of the pelvis were performed. Transabdominal technique was performed for global imaging of the pelvis including uterus, ovaries, adnexal regions, and pelvic cul-de-sac. It was necessary to proceed with endovaginal exam following the transabdominal exam to visualize the endometrium. COMPARISON:  August 10, 2019 FINDINGS: Uterus Measurements: 8.4 cm x 4.0 cm x  9.5 cm = volume: 80.6 mL. The uterine parenchyma is heterogeneous in echogenicity. No fibroids or other mass is visualized. Endometrium Thickness: 3.6 mm. A mild amount of fluid and echogenic material is seen within the endometrial canal. No abnormal flow is noted within this region on color Doppler evaluation. Right ovary Measurements: 2.8 cm x 3.6 cm x 2.7 cm = volume: 14.4 mL. Normal appearance/no adnexal mass. Left ovary Measurements: 2.0 cm x 1.6 cm x 2.0 cm = volume: 3.4 mL. Normal appearance/no adnexal mass. Other findings No abnormal free fluid. IMPRESSION: Small amount of fluid and echogenic material within the endometrial canal. A small amount of blood products cannot be excluded. Electronically Signed   By: Aram Candela M.D.   On: 02/01/2021 01:03     A/P 26 yo with AUB associated with hormonal contraception - Reassurance provided - Advised to remain hormonal free for 3 menstrual cycle and to use condoms in the meantime- patient agreeable to plan - Discussed levonorgestrel IUD as an option to control her menorrhagia. Patient desires to continue with the same pill she discontinued - New Rx provided with plan to start in May - Patient is current on pap smear and declined STI testing - RTC prn

## 2021-03-26 ENCOUNTER — Ambulatory Visit: Payer: Medicaid Other

## 2021-03-29 ENCOUNTER — Telehealth: Payer: Medicaid Other | Admitting: Physician Assistant

## 2021-03-29 DIAGNOSIS — A084 Viral intestinal infection, unspecified: Secondary | ICD-10-CM | POA: Diagnosis not present

## 2021-03-29 MED ORDER — ONDANSETRON 4 MG PO TBDP: 4.0000 mg | ORAL_TABLET | Freq: Three times a day (TID) | ORAL | 0 refills | Status: DC | PRN

## 2021-03-29 NOTE — Progress Notes (Signed)
I have spent 5 minutes in review of e-visit questionnaire, review and updating patient chart, medical decision making and response to patient.   Kehaulani Fruin Cody Deanda Ruddell, PA-C    

## 2021-03-29 NOTE — Progress Notes (Signed)
We are sorry that you are not feeling well.  Here is how we plan to help!  Based on what you have shared with me it looks like you have Acute Infectious Diarrhea.  Most cases of acute diarrhea are due to infections with virus and bacteria and are self-limited conditions lasting less than 14 days.  For your symptoms you may take Imodium 2 mg tablets that are over the counter at your local pharmacy. Take two tablet now and then one after each loose stool up to 6 a day.  Antibiotics are not needed for most people with diarrhea.  I have sent in Zofran 4 mg 1 tablet every 8 hours as needed for nausea and vomiting   HOME CARE We recommend changing your diet to help with your symptoms for the next few days. Drink plenty of fluids that contain water salt and sugar. Sports drinks such as Gatorade may help.  You may try broths, soups, bananas, applesauce, soft breads, mashed potatoes or crackers.  You are considered infectious for as long as the diarrhea continues. Hand washing or use of alcohol based hand sanitizers is recommend. It is best to stay out of work or school until your symptoms stop.   GET HELP RIGHT AWAY If you have dark yellow colored urine or do not pass urine frequently you should drink more fluids.   If your symptoms worsen  If you feel like you are going to pass out (faint) You have a new problem  MAKE SURE YOU  Understand these instructions. Will watch your condition. Will get help right away if you are not doing well or get worse.  Thank you for choosing an e-visit.  Your e-visit answers were reviewed by a board certified advanced clinical practitioner to complete your personal care plan. Depending upon the condition, your plan could have included both over the counter or prescription medications.  Please review your pharmacy choice. Make sure the pharmacy is open so you can pick up prescription now. If there is a problem, you may contact your provider through MyChart  messaging and have the prescription routed to another pharmacy.  Your safety is important to us. If you have drug allergies check your prescription carefully.   For the next 24 hours you can use MyChart to ask questions about today's visit, request a non-urgent call back, or ask for a work or school excuse. You will get an email in the next two days asking about your experience. I hope that your e-visit has been valuable and will speed your recovery.  

## 2021-04-29 ENCOUNTER — Telehealth: Payer: Medicaid Other | Admitting: Nurse Practitioner

## 2021-04-29 ENCOUNTER — Other Ambulatory Visit: Payer: Self-pay | Admitting: Nurse Practitioner

## 2021-04-29 DIAGNOSIS — J029 Acute pharyngitis, unspecified: Secondary | ICD-10-CM | POA: Diagnosis not present

## 2021-04-29 MED ORDER — IBUPROFEN 600 MG PO TABS: 600.0000 mg | ORAL_TABLET | Freq: Three times a day (TID) | ORAL | 0 refills | Status: DC | PRN

## 2021-04-29 NOTE — Progress Notes (Signed)
?  E-Visit for Sore Throat ? ?We are sorry that you are not feeling well.  Here is how we plan to help! ? ?Your symptoms indicate a likely viral infection (Pharyngitis).   Pharyngitis is inflammation in the back of the throat which can cause a sore throat, scratchiness and sometimes difficulty swallowing.   Pharyngitis is typically caused by a respiratory virus and will just run its course.  Please keep in mind that your symptoms could last up to 10 days.  For throat pain, we recommend over the counter oral pain relief medications such as acetaminophen or aspirin, or anti-inflammatory medications such as ibuprofen or naproxen sodium.  ?As you have not responded well to tylenol I have sent in a higher dosed ibuprofen for pain. ? ?Topical treatments such as oral throat lozenges or sprays may be used as needed.  Avoid close contact with loved ones, especially the very young and elderly.  Remember to wash your hands thoroughly throughout the day as this is the number one way to prevent the spread of infection and wipe down door knobs and counters with disinfectant. ? ?After careful review of your answers, I would not recommend an antibiotic for your condition.  Antibiotics should not be used to treat conditions that we suspect are caused by viruses like the virus that causes the common cold or flu. However, some people can have Strep with atypical symptoms. You may need formal testing in clinic or office to confirm if your symptoms continue or worsen. ? ?Providers prescribe antibiotics to treat infections caused by bacteria. Antibiotics are very powerful in treating bacterial infections when they are used properly.  To maintain their effectiveness, they should be used only when necessary.  Overuse of antibiotics has resulted in the development of super bugs that are resistant to treatment!   ? ?Home Care: ?Only take medications as instructed by your medical team. ?Do not drink alcohol while taking these medications. ?A  steam or ultrasonic humidifier can help congestion.  You can place a towel over your head and breathe in the steam from hot water coming from a faucet. ?Avoid close contacts especially the very young and the elderly. ?Cover your mouth when you cough or sneeze. ?Always remember to wash your hands. ? ?Get Help Right Away If: ?You develop worsening fever or throat pain. ?You develop a severe head ache or visual changes. ?Your symptoms persist after you have completed your treatment plan. ? ?Make sure you ?Understand these instructions. ?Will watch your condition. ?Will get help right away if you are not doing well or get worse. ? ? ?Thank you for choosing an e-visit. ? ?Your e-visit answers were reviewed by a board certified advanced clinical practitioner to complete your personal care plan. Depending upon the condition, your plan could have included both over the counter or prescription medications. ? ?Please review your pharmacy choice. Make sure the pharmacy is open so you can pick up prescription now. If there is a problem, you may contact your provider through [employer] and have the prescription routed to another pharmacy.  Your safety is important to [place]. If you have drug allergies check your prescription carefully.  ? ?For the next 24 hours you can use MyChart to ask questions about today's visit, request a non-urgent call back, or ask for a work or school excuse. ?You will get an email in the next two days asking about your experience. I hope that your e-visit has been valuable and will speed your recovery.  ?

## 2021-04-29 NOTE — Progress Notes (Signed)
I have spent 5 minutes in review of e-visit questionnaire, review and updating patient chart, medical decision making and response to patient.  ° °Kamdon Reisig W Dorea Duff, NP ° °  °

## 2021-05-13 ENCOUNTER — Telehealth: Payer: Medicaid Other | Admitting: Physician Assistant

## 2021-05-13 DIAGNOSIS — L299 Pruritus, unspecified: Secondary | ICD-10-CM

## 2021-05-14 MED ORDER — FLUTICASONE PROPIONATE 50 MCG/ACT NA SUSP: 2.0000 | Freq: Every day | NASAL | 0 refills | Status: DC

## 2021-05-14 NOTE — Progress Notes (Signed)
E visit for Allergic Rhinitis ?We are sorry that you are not feeling well.  Here is how we plan to help! ? ?Based on what you have shared with me it looks like you have Allergic Rhinitis.  Rhinitis is when a reaction occurs that causes nasal congestion, runny nose, sneezing, and itching.  Most types of rhinitis are caused by an inflammation and are associated with symptoms in the eyes ears or throat. ?There are several types of rhinitis.  The most common are acute rhinitis, which is usually caused by a viral illness, allergic or seasonal rhinitis, and nonallergic or year-round rhinitis.  Nasal allergies occur certain times of the year.  Allergic rhinitis is caused when allergens in the air trigger the release of histamine in the body.  Histamine causes itching, swelling, and fluid to build up in the fragile linings of the nasal passages, sinuses and eyelids.  An itchy nose and clear discharge are common. ? ?I recommend the following over the counter treatments: ?You should take a daily dose of antihistamine such as Claritin or Zyrtec OTC ? ?I also would recommend a nasal spray: ?Flonase 2 sprays into each nostril once daily; I will prescribe Flonase ? ?Ear Itching can also be a sign of wax build up. You can use OTC drops for ear wax such as Debrox to see if that helps as well.  ? ?HOME CARE: ? ?You can use an over-the-counter saline nasal spray as needed ?Avoid areas where there is heavy dust, mites, or molds ?Stay indoors on windy days during the pollen season ?Keep windows closed in home, at least in bedroom; use air conditioner. ?Use high-efficiency house air filter ?Keep windows closed in car, turn [facility] on re-circulate ?Avoid playing out with dog during pollen season ? ?GET HELP RIGHT AWAY IF: ? ?If your symptoms do not improve within 10 days ?You become short of breath ?You develop yellow or green discharge from your nose for over 3 days ?You have coughing fits ? ?MAKE SURE YOU: ? ?Understand these  instructions ?Will watch your condition ?Will get help right away if you are not doing well or get worse ? ?Thank you for choosing an e-visit. ?Your e-visit answers were reviewed by a board certified advanced clinical practitioner to complete your personal care plan. Depending upon the condition, your plan could have included both over the counter or prescription medications. ?Please review your pharmacy choice. Be sure that the pharmacy you have chosen is open so that you can pick up your prescription now.  If there is a problem you may message your provider in MyChart to have the prescription routed to another pharmacy. ?Your safety is important to [place]. If you have drug allergies check your prescription carefully.  ?For the next 24 hours, you can use MyChart to ask questions about today?s visit, request a non-urgent call back, or ask for a work or school excuse from your e-visit provider. ?You will get an email in the next two days asking about your experience. I hope that your e-visit has been valuable and will speed your recovery. ? ? ? ? ? ? ?I provided 5 minutes of non face-to-face time during this encounter for chart review and documentation.  ? ?

## 2021-05-15 ENCOUNTER — Encounter: Payer: Self-pay | Admitting: Obstetrics

## 2021-05-15 ENCOUNTER — Other Ambulatory Visit: Payer: Self-pay | Admitting: Obstetrics

## 2021-05-15 ENCOUNTER — Ambulatory Visit (INDEPENDENT_AMBULATORY_CARE_PROVIDER_SITE_OTHER): Payer: Medicaid Other | Admitting: Obstetrics

## 2021-05-15 ENCOUNTER — Other Ambulatory Visit (HOSPITAL_COMMUNITY)
Admission: RE | Admit: 2021-05-15 | Discharge: 2021-05-15 | Disposition: A | Discharge status: Home / Self Care | Payer: Medicaid Other | Source: Ambulatory Visit | Attending: Obstetrics | Admitting: Obstetrics

## 2021-05-15 VITALS — BP 116/78 | HR 73 | Ht 68.0 in | Wt 155.0 lb

## 2021-05-15 DIAGNOSIS — N926 Irregular menstruation, unspecified: Secondary | ICD-10-CM | POA: Diagnosis not present

## 2021-05-15 DIAGNOSIS — N898 Other specified noninflammatory disorders of vagina: Secondary | ICD-10-CM | POA: Insufficient documentation

## 2021-05-15 LAB — POCT URINE PREGNANCY: Preg Test, Ur: NEGATIVE

## 2021-05-15 NOTE — Progress Notes (Signed)
26 y.o GYN presents for "Late" period 5 days late.  C/o cramping, bloating, mood swings, constipation.  She does not use BC. ? ?UPT today is Negative ? ?Last PAP 06/12/2020 ?

## 2021-05-15 NOTE — Progress Notes (Addendum)
Patient ID: Ashley [name], female   DOB: 09/14/1995, 26 y.o.   MRN: [id number] ? ?Chief Complaint  ?Patient presents with  ? Gynecologic Exam  ? ? ?HPI ?Ashley [name] is a 25 y.o. female.  Period is late.  Complains of cramping and bloating and mood swings.  Also has vaginal discharge.  She does not use contraception. ?HPI ? ?Past Medical History:  ?Diagnosis Date  ? Medical history non-contributory   ? ? ?Past Surgical History:  ?Procedure Laterality Date  ? NO PAST SURGERIES    ? ? ?Family History  ?Problem Relation Age of Onset  ? Diabetes Mother   ? Hypertension Mother   ? ? ?Social History ?Social History  ? ?Tobacco Use  ? Smoking status: Never  ? Smokeless tobacco: Never  ?Vaping Use  ? Vaping Use: Never used  ?Substance Use Topics  ? Alcohol use: Yes  ?  Comment: occ  ? Drug use: No  ? ? ?Allergies  ?Allergen Reactions  ? Penicillins Hives and Rash  ?  Foamed from mouth ?Has patient had a PCN reaction causing immediate rash, facial/tongue/throat swelling, SOB or lightheadedness with hypotension: Yes ?Has patient had a PCN reaction causing severe rash involving mucus membranes or skin necrosis: No ?Has patient had a PCN reaction that required hospitalization No ?Has patient had a PCN reaction occurring within the last 10 years: No ?If all of the above answers are "NO", then may proceed with Cephalosporin use. ?  ? ? ?Current Outpatient Medications  ?Medication Sig Dispense Refill  ? fluticasone (FLONASE) 50 MCG/ACT nasal spray Place 2 sprays into both nostrils daily. 16 g 0  ? ibuprofen (ADVIL) 600 MG tablet Take 1 tablet (600 mg total) by mouth every 8 (eight) hours as needed. 30 tablet 0  ? valACYclovir (VALTREX) 500 MG tablet TAKE AS DIRECTED FOR 3 DAYS AS NEEDED FOR EACH OUTBREAK 30 tablet 11  ? ?No current facility-administered medications for this visit.  ? ? ?Review of Systems ?Review of Systems ?Constitutional: negative for fatigue and weight loss ?Respiratory: negative for cough and  wheezing ?Cardiovascular: negative for chest pain, fatigue and palpitations ?Gastrointestinal: positive for abdominal pain and change in bowel habits ?Genitourinary: positive for vaginal discharge ?Integument/breast: negative for nipple discharge ?Musculoskeletal:negative for myalgias ?Neurological: negative for gait problems and tremors ?Behavioral/Psych: negative for abusive relationship, depression ?Endocrine: negative for temperature intolerance    ?  ?Blood pressure 116/78, pulse 73, height 5\' 8"  (1.727 m), weight 155 lb (70.3 kg), last menstrual period 04/12/2021. ? ?Physical Exam ?Physical Exam ?General:   Alert and no distress  ?Skin:   no rash or abnormalities  ?Lungs:   clear to auscultation bilaterally  ?Heart:   regular rate and rhythm, S1, S2 normal, no murmur, click, rub or gallop  ?Breasts:   normal without suspicious masses, skin or nipple changes or axillary nodes  ?Abdomen:  normal findings: no organomegaly, soft, non-tender and no hernia  ?Pelvis:  External genitalia: normal general appearance ?Urinary system: urethral meatus normal and bladder without fullness, nontender ?Vaginal: normal without tenderness, induration or masses ?Cervix: normal appearance ?Adnexa: normal bimanual exam ?Uterus: anteverted and non-tender, normal size  ?  ?I have spent a total of 20 minutes of face-to-face time, excluding clinical staff time, reviewing notes and preparing to see patient, ordering tests and/or medications, and counseling the patient.  ? ?Data Reviewed ?UPT:  Negative ? ?Assessment  ?   ?1. Missed period ?Rx ?- POCT urine pregnancy:  POSITIVE ? ?2. Vaginal  discharge ?Rx: ?- Cervicovaginal ancillary only( [place])  ?  ? ?Plan ?  Will follow clinically for another menstrual cycle ? ?Orders Placed This Encounter  ?Procedures  ? POCT urine pregnancy  ? ? ? ? Brock Bad, MD ?05/15/2021 3:28 PM  ?

## 2021-05-16 ENCOUNTER — Encounter: Payer: Self-pay | Admitting: Obstetrics

## 2021-05-17 LAB — MOLECULAR ANCILLARY ONLY
Bacterial Vaginitis (gardnerella): NEGATIVE
Candida Glabrata: NEGATIVE
Candida Vaginitis: NEGATIVE
Chlamydia: NEGATIVE
Comment: NEGATIVE
Comment: NEGATIVE
Comment: NEGATIVE
Comment: NEGATIVE
Comment: NEGATIVE
Comment: NORMAL
Neisseria Gonorrhea: NEGATIVE
Trichomonas: NEGATIVE

## 2021-06-11 ENCOUNTER — Telehealth: Payer: Medicaid Other | Admitting: Emergency Medicine

## 2021-06-11 DIAGNOSIS — R059 Cough, unspecified: Secondary | ICD-10-CM

## 2021-06-11 DIAGNOSIS — R062 Wheezing: Secondary | ICD-10-CM

## 2021-06-11 NOTE — Progress Notes (Signed)
Did not respond to questions through mychart about symptoms. Will close encounter.  ?

## 2021-06-12 ENCOUNTER — Telehealth (INDEPENDENT_AMBULATORY_CARE_PROVIDER_SITE_OTHER): Payer: Medicaid Other | Admitting: Advanced Practice Midwife

## 2021-06-12 DIAGNOSIS — N926 Irregular menstruation, unspecified: Secondary | ICD-10-CM | POA: Diagnosis not present

## 2021-06-12 DIAGNOSIS — Z3009 Encounter for other general counseling and advice on contraception: Secondary | ICD-10-CM | POA: Diagnosis not present

## 2021-06-12 DIAGNOSIS — R102 Pelvic and perineal pain: Secondary | ICD-10-CM

## 2021-06-12 NOTE — Progress Notes (Signed)
? ? ?  GYNECOLOGY VIRTUAL VISIT ENCOUNTER NOTE ? ?Provider location: [facility] [place]  ? ?Patient location: Home ? ?I connected with Ashley [name] on 06/12/21 at  1:30 PM EDT by MyChart Video Encounter and verified that I am speaking with the correct person using two identifiers. ?  ?I discussed the limitations, risks, security and privacy concerns of performing an evaluation and management service virtually and the availability of in person appointments. I also discussed with the patient that there may be a patient responsible charge related to this service. The patient expressed understanding and agreed to proceed. ?  ?History:  ?Ashley [name] is a 26 y.o. G27P2002 female being evaluated today for late menses and pelvic pain. Her period was expected 4/9, and she had cramping/pulling pain at the time it was expected. When the menses did begin on 4/27, her pain resolved.  She denies any abnormal vaginal discharge, bleeding, pelvic pain or other concerns.   ?  ?  ?Past Medical History:  ?Diagnosis Date  ? Medical history non-contributory   ? ?Past Surgical History:  ?Procedure Laterality Date  ? NO PAST SURGERIES    ? ?The following portions of the patient's history were reviewed and updated as appropriate: allergies, current medications, past family history, past medical history, past social history, past surgical history and problem list.  ? ?Health Maintenance:  Normal pap and negative HRHPV on 06/2020.   ? ?Review of Systems:  ?Pertinent items noted in HPI and remainder of comprehensive ROS otherwise negative. ? ?Physical Exam:  ? ?General:  Alert, oriented and cooperative. Patient appears to be in no acute distress.  ?Mental Status: Normal mood and affect. Normal behavior. Normal judgment and thought content.   ?Respiratory: Normal respiratory effort, no problems with respiration noted  ?Rest of physical exam deferred due to type of encounter ? ?Labs and Imaging ?No results found for this or  any previous visit (from the past 336 hour(s)). ?No results found.   ?  ?Assessment and Plan:  ?1. Missed period ?--Menses expected 4/9, had pulling sensation pain at time of expected period, but period started 4/27 and pain resolved.   ?--F/U if menses continue to be irregular or if pain returns ?--F/U for annual exam, Pap due 2025 ? ?2. Acute pelvic pain, female ? ? ? ?3. Encounter for counseling regarding contraception ?--Discussed pt contraceptive plans and reviewed contraceptive methods based on pt preferences and effectiveness.  Pt may desire pregnancy in the next year and does not desire pregnancy prevention.   ?  ?I discussed the assessment and treatment plan with the patient. The patient was provided an opportunity to ask questions and all were answered. The patient agreed with the plan and demonstrated an understanding of the instructions. ?  ?The patient was advised to call back or seek an in-person evaluation/go to the ED if the symptoms worsen or if the condition fails to improve as anticipated. ? ?I provided 7 minutes of face-to-face time during this encounter. ? ? ?Sharen Counter, CNM ?[facility], [facility]  ?

## 2021-06-12 NOTE — Progress Notes (Signed)
Virtual follow up to discuss late cycle. Patient had cycle on 3/9, but missed cycle on 4/9 with negative pregnancy test. ?Pt states menstrual cycle started on 4/27.  ?Reports pain has improved. ?

## 2021-06-25 ENCOUNTER — Encounter: Payer: Self-pay | Admitting: Obstetrics

## 2021-07-11 ENCOUNTER — Ambulatory Visit: Payer: Medicaid Other | Admitting: Obstetrics

## 2021-07-13 ENCOUNTER — Other Ambulatory Visit: Payer: Self-pay | Admitting: Obstetrics

## 2021-07-13 ENCOUNTER — Other Ambulatory Visit: Payer: Self-pay | Admitting: Obstetrics and Gynecology

## 2021-07-13 ENCOUNTER — Ambulatory Visit (INDEPENDENT_AMBULATORY_CARE_PROVIDER_SITE_OTHER): Payer: Medicaid Other | Admitting: *Deleted

## 2021-07-13 VITALS — BP 124/86 | HR 69

## 2021-07-13 DIAGNOSIS — B009 Herpesviral infection, unspecified: Secondary | ICD-10-CM

## 2021-07-13 DIAGNOSIS — Z3480 Encounter for supervision of other normal pregnancy, unspecified trimester: Secondary | ICD-10-CM

## 2021-07-13 DIAGNOSIS — Z3200 Encounter for pregnancy test, result unknown: Secondary | ICD-10-CM

## 2021-07-13 LAB — POCT URINE PREGNANCY: Preg Test, Ur: POSITIVE — AB

## 2021-07-13 MED ORDER — PROMETHAZINE HCL 25 MG PO TABS: 25.0000 mg | ORAL_TABLET | Freq: Four times a day (QID) | ORAL | 1 refills | Status: DC | PRN

## 2021-07-13 MED ORDER — PRENATAL 19 29-1 MG PO CHEW: 1.0000 | CHEWABLE_TABLET | Freq: Every day | ORAL | 11 refills | Status: DC

## 2021-07-13 NOTE — Progress Notes (Cosign Needed)
Ms. Starnes presents today for UPT. She has no unusual complaints. LMP: 05/31/21    OBJECTIVE: Appears well, in no apparent distress.  OB History     Gravida  2   Para  2   Term  2   Preterm      AB      Living  2      SAB      IAB      Ectopic      Multiple  0   Live Births  2          Home UPT Result: positive In-Office UPT result: positive I have reviewed the patient's medical, obstetrical, social, and family histories, and medications.   ASSESSMENT: Positive pregnancy test  PLAN Prenatal care to be completed at: [facility] sent PNV and phenergan per protocol. US/Intake scheduled at check out.

## 2021-07-13 NOTE — Progress Notes (Signed)
Agree with nurses's documentation of this patient's clinic encounter.  Shyteria Lewis L, MD  

## 2021-07-14 ENCOUNTER — Inpatient Hospital Stay (HOSPITAL_COMMUNITY): Payer: Medicaid Other

## 2021-07-14 ENCOUNTER — Inpatient Hospital Stay (HOSPITAL_COMMUNITY)
Admission: AD | Admit: 2021-07-14 | Discharge: 2021-07-14 | Disposition: A | Discharge status: Home / Self Care | Payer: Medicaid Other | Attending: Obstetrics & Gynecology | Admitting: Obstetrics & Gynecology

## 2021-07-14 DIAGNOSIS — Z3A01 Less than 8 weeks gestation of pregnancy: Secondary | ICD-10-CM | POA: Diagnosis not present

## 2021-07-14 DIAGNOSIS — O3680X0 Pregnancy with inconclusive fetal viability, not applicable or unspecified: Secondary | ICD-10-CM | POA: Insufficient documentation

## 2021-07-14 DIAGNOSIS — O209 Hemorrhage in early pregnancy, unspecified: Secondary | ICD-10-CM | POA: Diagnosis not present

## 2021-07-14 LAB — CBC
HCT: 36 % (ref 36.0–46.0)
Hemoglobin: 11.6 g/dL — ABNORMAL LOW (ref 12.0–15.0)
MCH: 29.5 pg (ref 26.0–34.0)
MCHC: 32.2 g/dL (ref 30.0–36.0)
MCV: 91.6 fL (ref 80.0–100.0)
Platelets: 297 10*3/uL (ref 150–400)
RBC: 3.93 MIL/uL (ref 3.87–5.11)
RDW: 13.3 % (ref 11.5–15.5)
WBC: 7.2 10*3/uL (ref 4.0–10.5)
nRBC: 0 % (ref 0.0–0.2)

## 2021-07-14 LAB — WET PREP, GENITAL
Clue Cells Wet Prep HPF POC: NONE SEEN
Sperm: NONE SEEN
Trich, Wet Prep: NONE SEEN
WBC, Wet Prep HPF POC: <10 (ref <10)
Yeast Wet Prep HPF POC: NONE SEEN

## 2021-07-14 LAB — HCG, QUANTITATIVE, PREGNANCY: hCG, Beta Chain, Quant, S: 82 m[IU]/mL — ABNORMAL HIGH (ref <5)

## 2021-07-14 LAB — URINALYSIS, ROUTINE W REFLEX MICROSCOPIC
Bacteria, UA: NONE SEEN
Bilirubin Urine: NEGATIVE
Glucose, UA: NEGATIVE mg/dL
Ketones, ur: NEGATIVE mg/dL
Leukocytes,Ua: NEGATIVE
Nitrite: NEGATIVE
Protein, ur: NEGATIVE mg/dL
Specific Gravity, Urine: 1.02 (ref 1.005–1.030)
pH: 6 (ref 5.0–8.0)

## 2021-07-14 LAB — ABO/RH: ABO/RH(D): A POS

## 2021-07-14 NOTE — MAU Provider Note (Signed)
History     CSN: [id number]  Arrival date and time: 07/14/21 6063   Event Date/Time   First Provider Initiated Contact with Patient 07/14/21 1938      Chief Complaint  Patient presents with   Abdominal Pain   Vaginal Bleeding   HPI  Ashley [name] is a 26 y.o. G3P2002 at [redacted]w[redacted]d who presents for evaluation of vaginal bleeding and lower abdominal cramping. Patient reports she had light pink spotting earlier today that is now like a period. She also reports lower abdominal cramping that she rates a 5/10. Denies any constipation, diarrhea or any urinary complaints.   OB History     Gravida  3   Para  2   Term  2   Preterm      AB      Living  2      SAB      IAB      Ectopic      Multiple  0   Live Births  2           Past Medical History:  Diagnosis Date   Medical history non-contributory     Past Surgical History:  Procedure Laterality Date   NO PAST SURGERIES      Family History  Problem Relation Age of Onset   Diabetes Mother    Hypertension Mother     Social History   Tobacco Use   Smoking status: Never   Smokeless tobacco: Never  Vaping Use   Vaping Use: Never used  Substance Use Topics   Alcohol use: Yes    Comment: occ   Drug use: No    Allergies:  Allergies  Allergen Reactions   Penicillins Hives and Rash    Foamed from mouth Has patient had a PCN reaction causing immediate rash, facial/tongue/throat swelling, SOB or lightheadedness with hypotension: Yes Has patient had a PCN reaction causing severe rash involving mucus membranes or skin necrosis: No Has patient had a PCN reaction that required hospitalization No Has patient had a PCN reaction occurring within the last 10 years: No If all of the above answers are "NO", then may proceed with Cephalosporin use.     Medications Prior to Admission  Medication Sig Dispense Refill Last Dose   Prenatal Vit-Fe Fumarate-FA (PRENATAL 19) 29-1 MG CHEW CHEW 1 TABLET BY MOUTH EVERY  DAY 30 tablet 11    promethazine (PHENERGAN) 25 MG tablet Take 1 tablet (25 mg total) by mouth every 6 (six) hours as needed for nausea or vomiting. 30 tablet 1    valACYclovir (VALTREX) 500 MG tablet TAKE AS DIRECTED FOR 3 DAYS AS NEEDED FOR EACH OUTBREAK 90 tablet 3     Review of Systems  Constitutional: Negative.  Negative for fatigue and fever.  HENT: Negative.    Respiratory: Negative.  Negative for shortness of breath.   Cardiovascular: Negative.  Negative for chest pain.  Gastrointestinal:  Positive for abdominal pain. Negative for constipation, diarrhea, nausea and vomiting.  Genitourinary:  Positive for vaginal bleeding. Negative for dysuria.  Neurological: Negative.  Negative for dizziness and headaches.   Physical Exam   Blood pressure 120/77, pulse 84, temperature 98.7 F (37.1 C), temperature source Oral, resp. rate 18, height 5\' 8"  (1.727 m), weight 70.6 kg, last menstrual period 05/31/2021, SpO2 99 %.  Patient Vitals for the past 24 hrs:  BP Temp Temp src Pulse Resp SpO2 Height Weight  07/14/21 1914 120/77 98.7 F (37.1 C) Oral 84 18 99 %  5\' 8"  (1.727 m) 70.6 kg    Physical Exam Vitals and nursing note reviewed.  Constitutional:      General: She is not in acute distress.    Appearance: She is well-developed.  HENT:     Head: Normocephalic.  Eyes:     Pupils: Pupils are equal, round, and reactive to light.  Cardiovascular:     Rate and Rhythm: Normal rate and regular rhythm.     Heart sounds: Normal heart sounds.  Pulmonary:     Effort: Pulmonary effort is normal. No respiratory distress.     Breath sounds: Normal breath sounds.  Abdominal:     General: Bowel sounds are normal. There is no distension.     Palpations: Abdomen is soft.     Tenderness: There is no abdominal tenderness.  Skin:    General: Skin is warm and dry.  Neurological:     Mental Status: She is alert and oriented to person, place, and time.  Psychiatric:        Mood and Affect: Mood  normal.        Behavior: Behavior normal.        Thought Content: Thought content normal.        Judgment: Judgment normal.     MAU Course  Procedures  Results for orders placed or performed during the hospital encounter of 07/14/21 (from the past 24 hour(s))  Wet prep, genital     Status: None   Collection Time: 07/14/21  6:54 PM  Result Value Ref Range   Yeast Wet Prep HPF POC NONE SEEN NONE SEEN   Trich, Wet Prep NONE SEEN NONE SEEN   Clue Cells Wet Prep HPF POC NONE SEEN NONE SEEN   WBC, Wet Prep HPF POC <10 <10   Sperm NONE SEEN   Urinalysis, Routine w reflex microscopic     Status: Abnormal   Collection Time: 07/14/21  7:13 PM  Result Value Ref Range   Color, Urine YELLOW YELLOW   APPearance CLEAR CLEAR   Specific Gravity, Urine 1.020 1.005 - 1.030   pH 6.0 5.0 - 8.0   Glucose, UA NEGATIVE NEGATIVE mg/dL   Hgb urine dipstick LARGE (A) NEGATIVE   Bilirubin Urine NEGATIVE NEGATIVE   Ketones, ur NEGATIVE NEGATIVE mg/dL   Protein, ur NEGATIVE NEGATIVE mg/dL   Nitrite NEGATIVE NEGATIVE   Leukocytes,Ua NEGATIVE NEGATIVE   RBC / HPF 11-20 0 - 5 RBC/hpf   WBC, UA 0-5 0 - 5 WBC/hpf   Bacteria, UA NONE SEEN NONE SEEN   Squamous Epithelial / LPF 0-5 0 - 5   Mucus PRESENT   CBC     Status: Abnormal   Collection Time: 07/14/21  7:27 PM  Result Value Ref Range   WBC 7.2 4.0 - 10.5 K/uL   RBC 3.93 3.87 - 5.11 MIL/uL   Hemoglobin 11.6 (L) 12.0 - 15.0 g/dL   HCT 09/13/21 47.4 - 25.9 %   MCV 91.6 80.0 - 100.0 fL   MCH 29.5 26.0 - 34.0 pg   MCHC 32.2 30.0 - 36.0 g/dL   RDW 56.3 87.5 - 64.3 %   Platelets 297 150 - 400 K/uL   nRBC 0.0 0.0 - 0.2 %  hCG, quantitative, pregnancy     Status: Abnormal   Collection Time: 07/14/21  7:27 PM  Result Value Ref Range   hCG, Beta Chain, Quant, S 82 (H) <5 mIU/mL  ABO/Rh     Status: None (Preliminary result)   Collection Time:  07/14/21  7:27 PM  Result Value Ref Range   ABO/RH(D) PENDING      US OB LESS THAN 14 WEEKS WITH OB  TRANSVAGINAL  Result Date: 07/14/2021 CLINICAL DATA:  Spotting, vaginal bleeding, cramping EXAM: OBSTETRIC <14 WK ULTRASOUND TECHNIQUE: Transabdominal ultrasound was performed for evaluation of the gestation as well as the maternal uterus and adnexal regions. COMPARISON:  None Available. FINDINGS: Intrauterine gestational sac: None Yolk sac:  Not Visualized. Embryo:  Not Visualized. Cardiac Activity: Not Visualized. Heart Rate: Not visualized. Subchorionic hemorrhage:  None visualized. Maternal uterus/adnexae: Unremarkable. IMPRESSION: No candidate intrauterine gestation identified by ultrasound. Early pregnancy of unknown location. Recommend ectopic precautions, serial beta HCG and follow-up ultrasound in 7-14 days to assess for development and viability. Electronically Signed   By: Jearld LeschAlex D Bibbey M.D.   On: 07/14/2021 20:38     MDM Labs ordered and reviewed.   UA, UPT CBC, HCG ABO/Rh- A Pos Wet prep and gc/chlamydia US OB Comp Less 14 weeks with Transvaginal  CNM independently reviewed the imaging ordered. Imaging show no IUP  Discussed with client the diagnosis of pregnancy of unknown anatomic location.  Three possibilities of outcome are: a healthy pregnancy that is too early to see a yolk sac to confirm the pregnancy is in the uterus, a pregnancy that is not healthy and has not developed and will not develop, and an ectopic pregnancy that is in the abdomen that cannot be identified at this time.  And ectopic pregnancy can be a life threatening situation as a pregnancy needs to be in the uterus which is a muscle and can stretch to accommodate the growth of a pregnancy.  Other structures in the pelvis and abdomen as not muscular and do not stretch with the growth of a pregnancy.  Worst case scenario is that a structure ruptures with a growing pregnancy not in the uterus and and internal hemorrhage can be a life threatening situation.  We need to follow the progression of this pregnancy carefully.   We need to check another serum pregnancy hormone level to determine if the levels are rising appropriately  and to determine the next steps that are needed for you. Patient's questions were answered.   Assessment and Plan   1. Pregnancy of unknown anatomic location   2. [redacted] weeks gestation of pregnancy   3. Vaginal bleeding affecting early pregnancy     -Discharge home in stable condition -Strict ectopic precautions discussed -Patient advised to follow-up with MAU on 6/12 for repeat HCG, no appointments in Office -Patient may return to MAU as needed or if her condition were to change or worsen  Rolm BookbinderCaroline M Tomesha Sargent, CNM 07/14/2021, 8:54 PM

## 2021-07-14 NOTE — Discharge Instructions (Signed)

## 2021-07-14 NOTE — MAU Note (Signed)
.  Ashley [name] is a 25 y.o. at [redacted]w[redacted]d here in MAU reporting: VB spotting starting today and intermittent lower ABD cramping with constant back pain and right sided pain with dull ache since Wednesday. Pt stated the spotting started with light pink spotting earlier this am and then now is more of a light period flow of bright red bleeding. Pt wearing a pad and states she can feel it coming.  Pt denies LOF, abnormal discharge, and recent intercourse.   Onset of complaint: two days ago  Pain score: 5/10 ABD, 2/10 back Vitals:   07/14/21 1914  BP: 120/77  Pulse: 84  Resp: 18  Temp: 98.7 F (37.1 C)  SpO2: 99%      Lab orders placed from triage:  UA, Wet prep/GC

## 2021-07-15 ENCOUNTER — Inpatient Hospital Stay (HOSPITAL_COMMUNITY)
Admission: AD | Admit: 2021-07-15 | Discharge: 2021-07-15 | Disposition: A | Discharge status: Home / Self Care | Payer: Medicaid Other | Attending: Obstetrics and Gynecology | Admitting: Obstetrics and Gynecology

## 2021-07-15 DIAGNOSIS — O200 Threatened abortion: Secondary | ICD-10-CM | POA: Insufficient documentation

## 2021-07-15 DIAGNOSIS — O3680X0 Pregnancy with inconclusive fetal viability, not applicable or unspecified: Secondary | ICD-10-CM | POA: Diagnosis not present

## 2021-07-15 DIAGNOSIS — O4690 Antepartum hemorrhage, unspecified, unspecified trimester: Secondary | ICD-10-CM

## 2021-07-15 DIAGNOSIS — Z3A01 Less than 8 weeks gestation of pregnancy: Secondary | ICD-10-CM | POA: Insufficient documentation

## 2021-07-15 LAB — HCG, QUANTITATIVE, PREGNANCY: hCG, Beta Chain, Quant, S: 64 m[IU]/mL — ABNORMAL HIGH (ref <5)

## 2021-07-15 MED ORDER — IBUPROFEN 600 MG PO TABS
600.0000 mg | ORAL_TABLET | Freq: Once | ORAL | Status: AC
  Administered 2021-07-15: 600 mg via ORAL
  Filled 2021-07-15: qty 1

## 2021-07-15 NOTE — MAU Note (Signed)
.  Ashley [name] is a 26 y.o. at [redacted]w[redacted]d here in MAU reporting: increased vag bleeding with clots that started at 1100 this morning. States she was seen yesterday for bleeding, but reports its much heavier today.  Reports lower abd pressure. Onset of complaint: 1100 Pain score: 5/10

## 2021-07-15 NOTE — MAU Provider Note (Signed)
History     CSN: [id number]  Arrival date and time: 07/15/21 1146   Event Date/Time   First Provider Initiated Contact with Patient 07/15/21 1227      Chief Complaint  Patient presents with   Vaginal Bleeding   Abdominal Pain   HPI  [name] is a 26 y.o. female G82P2002 @ [redacted]w[redacted]d here in MAU with worsening bleeding. She reports light bleeding yesterday in the AM, around 1500 she started noticing heavier, menstrual like bleeding. The bleeding has slowed down this morning. Her pain is the same. She was seen yesterday in MAU and was scheduled to come back tomorrow for repeat blood work. [place] yesterday showed no IUP. The pain is described as pelvic pressure. The pressure comes and goes.   OB History     Gravida  3   Para  2   Term  2   Preterm      AB      Living  2      SAB      IAB      Ectopic      Multiple  0   Live Births  2           Past Medical History:  Diagnosis Date   Medical history non-contributory     Past Surgical History:  Procedure Laterality Date   NO PAST SURGERIES      Family History  Problem Relation Age of Onset   Diabetes Mother    Hypertension Mother     Social History   Tobacco Use   Smoking status: Never   Smokeless tobacco: Never  Vaping Use   Vaping Use: Never used  Substance Use Topics   Alcohol use: Yes    Comment: occ   Drug use: No    Allergies:  Allergies  Allergen Reactions   Penicillins Hives and Rash    Foamed from mouth Has patient had a PCN reaction causing immediate rash, facial/tongue/throat swelling, SOB or lightheadedness with hypotension: Yes Has patient had a PCN reaction causing severe rash involving mucus membranes or skin necrosis: No Has patient had a PCN reaction that required hospitalization No Has patient had a PCN reaction occurring within the last 10 years: No If all of the above answers are "NO", then may proceed with Cephalosporin use.     Medications Prior to Admission   Medication Sig Dispense Refill Last Dose   Prenatal Vit-Fe Fumarate-FA (PRENATAL 19) 29-1 MG CHEW CHEW 1 TABLET BY MOUTH EVERY DAY 30 tablet 11    promethazine (PHENERGAN) 25 MG tablet Take 1 tablet (25 mg total) by mouth every 6 (six) hours as needed for nausea or vomiting. 30 tablet 1    valACYclovir (VALTREX) 500 MG tablet TAKE AS DIRECTED FOR 3 DAYS AS NEEDED FOR EACH OUTBREAK 90 tablet 3    Results for orders placed or performed during the hospital encounter of 07/15/21 (from the past 48 hour(s))  hCG, quantitative, pregnancy     Status: Abnormal   Collection Time: 07/15/21 12:30 PM  Result Value Ref Range   hCG, Beta Chain, Quant, S 64 (H) <5 mIU/mL    Comment:          GEST. AGE      CONC.  (mIU/mL)   <=1 WEEK        5 - 50     2 WEEKS       50 - 500     3 WEEKS  100 - 10,000     4 WEEKS     1,000 - 30,000     5 WEEKS     3,500 - 115,000   6-8 WEEKS     12,000 - 270,000    12 WEEKS     15,000 - 220,000        FEMALE AND NON-PREGNANT FEMALE:     LESS THAN 5 mIU/mL Performed at [facility], [address] [place], [place] [address]      Review of Systems  Constitutional:  Negative for fever.  Gastrointestinal:  Positive for abdominal pain.  Genitourinary:  Positive for vaginal bleeding. Negative for vaginal discharge.  Neurological:  Negative for dizziness, light-headedness and headaches.   Physical Exam   Blood pressure 124/80, pulse 80, temperature 98.3 F (36.8 C), temperature source Oral, resp. rate 16, last menstrual period 05/31/2021, SpO2 100 %.  Physical Exam Constitutional:      General: She is not in acute distress.    Appearance: She is well-developed. She is not ill-appearing, toxic-appearing or diaphoretic.  HENT:     Head: Normocephalic.  Abdominal:     Tenderness: There is generalized abdominal tenderness. There is no guarding or rebound.  Neurological:     Mental Status: She is alert and oriented to person, place, and time.    MAU  Course  Procedures  MDM  O positive blood type. Hcg 6/10: 82  Assessment and Plan   A:  1. Pregnancy of unknown anatomic location   2. Vaginal bleeding in pregnancy   3. Threatened miscarriage      P:  Discussed results in detail Continue serial Quants.  Go to the office Wednesday morning at 0830 for lab work Ectopic precautions Pelvic rest Support given  Venia Carbon I, NP 07/15/2021 2:37 PM

## 2021-07-16 ENCOUNTER — Other Ambulatory Visit (HOSPITAL_COMMUNITY): Payer: Medicaid Other

## 2021-07-16 LAB — GC/CHLAMYDIA PROBE AMP (~~LOC~~) NOT AT ARMC
Chlamydia: NEGATIVE
Comment: NEGATIVE
Comment: NORMAL
Neisseria Gonorrhea: NEGATIVE

## 2021-07-17 ENCOUNTER — Ambulatory Visit: Payer: Medicaid Other | Admitting: Obstetrics

## 2021-07-18 ENCOUNTER — Other Ambulatory Visit: Payer: Self-pay

## 2021-07-18 ENCOUNTER — Other Ambulatory Visit: Payer: Medicaid Other

## 2021-07-18 DIAGNOSIS — O3680X0 Pregnancy with inconclusive fetal viability, not applicable or unspecified: Secondary | ICD-10-CM

## 2021-07-18 NOTE — Progress Notes (Signed)
error 

## 2021-07-19 LAB — BETA HCG QUANT (REF LAB): hCG Quant: 6 m[IU]/mL

## 2021-07-30 ENCOUNTER — Ambulatory Visit (INDEPENDENT_AMBULATORY_CARE_PROVIDER_SITE_OTHER): Payer: Medicaid Other

## 2021-07-30 ENCOUNTER — Ambulatory Visit
Admission: RE | Admit: 2021-07-30 | Discharge: 2021-07-30 | Disposition: A | Discharge status: Home / Self Care | Payer: Medicaid Other | Source: Ambulatory Visit | Attending: Obstetrics and Gynecology | Admitting: Obstetrics and Gynecology

## 2021-07-30 VITALS — BP 121/84 | HR 74 | Wt 153.2 lb

## 2021-07-30 DIAGNOSIS — Z712 Person consulting for explanation of examination or test findings: Secondary | ICD-10-CM

## 2021-07-30 DIAGNOSIS — O3680X0 Pregnancy with inconclusive fetal viability, not applicable or unspecified: Secondary | ICD-10-CM | POA: Insufficient documentation

## 2021-07-30 NOTE — Progress Notes (Addendum)
Ultrasound Result Visit  Ashley [name] presents to [facility] for results following [place] for pregnancy of unknown location. She was seen in MAU on 07/14/21 and 07/15/21 for vaginal bleeding and abdominal cramping during early pregnancy. [place] did not confirm IUP on 07/14/21, serial beta HCG recommended for follow up.  HCG results: 07/14/21 2050 82  07/15/21 1230 64  07/18/21 1137 6   Ultrasound results, HCG results, and patient history reviewed with [name], MD who states this appears to be a miscarriage. Provider states ultrasound shows anatomically normal uterus and ovaries, no physical cause of miscarriage seen. Provider recommends follow up birth control visit with [facility] location. Results and provider recommendation reviewed with patient. Patient denies any continued bleeding or pain today. All concerns addressed.  Marjo Bicker 07/30/2021 12:14 PM

## 2021-08-03 ENCOUNTER — Other Ambulatory Visit (HOSPITAL_COMMUNITY)
Admission: RE | Admit: 2021-08-03 | Discharge: 2021-08-03 | Disposition: A | Discharge status: Home / Self Care | Payer: Medicaid Other | Source: Ambulatory Visit | Attending: Obstetrics and Gynecology | Admitting: Obstetrics and Gynecology

## 2021-08-03 ENCOUNTER — Ambulatory Visit (INDEPENDENT_AMBULATORY_CARE_PROVIDER_SITE_OTHER): Payer: Medicaid Other

## 2021-08-03 VITALS — BP 114/78 | HR 86 | Ht 68.0 in | Wt 154.0 lb

## 2021-08-03 DIAGNOSIS — Z113 Encounter for screening for infections with a predominantly sexual mode of transmission: Secondary | ICD-10-CM

## 2021-08-03 DIAGNOSIS — B3731 Acute candidiasis of vulva and vagina: Secondary | ICD-10-CM | POA: Insufficient documentation

## 2021-08-03 NOTE — Progress Notes (Signed)
Agree with nurses's documentation of this patient's clinic encounter.  Tamrah Victorino L, MD  

## 2021-08-03 NOTE — Progress Notes (Signed)
SUBJECTIVE:  26 y.o. female complains of white vaginal discharge for 3 day(s) and possible exposure, because her partner cheated on her. Denies abnormal vaginal bleeding or significant pelvic pain or fever. No UTI symptoms. Denies history of known exposure to STD.  Patient's last menstrual period was 08/02/2021 (approximate).  OBJECTIVE:  She appears well, afebrile. Urine dipstick: not done.  ASSESSMENT:  Vaginal Discharge  Partner cheated    PLAN:  GC, chlamydia, trichomonas, BVAG, CVAG probe sent to lab. Treatment: To be determined once lab results are received ROV prn if symptoms persist or worsen.

## 2021-08-06 LAB — CERVICOVAGINAL ANCILLARY ONLY
Bacterial Vaginitis (gardnerella): NEGATIVE
Candida Glabrata: NEGATIVE
Candida Vaginitis: POSITIVE — AB
Chlamydia: NEGATIVE
Comment: NEGATIVE
Comment: NEGATIVE
Comment: NEGATIVE
Comment: NEGATIVE
Comment: NEGATIVE
Comment: NORMAL
Neisseria Gonorrhea: NEGATIVE
Trichomonas: NEGATIVE

## 2021-08-07 LAB — HEPB+HEPC+HIV PANEL
HIV Screen 4th Generation wRfx: NONREACTIVE
Hep B C IgM: NEGATIVE
Hep B Core Total Ab: NEGATIVE
Hep B E Ab: NEGATIVE
Hep B E Ag: NEGATIVE
Hep B Surface Ab, Qual: NONREACTIVE
Hep C Virus Ab: NONREACTIVE
Hepatitis B Surface Ag: NEGATIVE

## 2021-08-07 LAB — RPR: RPR Ser Ql: NONREACTIVE

## 2021-08-08 ENCOUNTER — Encounter: Payer: Self-pay | Admitting: Obstetrics and Gynecology

## 2021-08-08 ENCOUNTER — Other Ambulatory Visit: Payer: Self-pay | Admitting: Obstetrics and Gynecology

## 2021-08-08 MED ORDER — MICONAZOLE NITRATE 2 % VA CREA: 1.0000 | TOPICAL_CREAM | Freq: Every day | VAGINAL | 2 refills | Status: DC

## 2021-08-16 ENCOUNTER — Ambulatory Visit (INDEPENDENT_AMBULATORY_CARE_PROVIDER_SITE_OTHER): Payer: Medicaid Other | Admitting: Obstetrics & Gynecology

## 2021-08-16 ENCOUNTER — Encounter: Payer: Self-pay | Admitting: Obstetrics & Gynecology

## 2021-08-16 VITALS — BP 121/83 | HR 69 | Ht 68.0 in | Wt 155.4 lb

## 2021-08-16 DIAGNOSIS — Z30011 Encounter for initial prescription of contraceptive pills: Secondary | ICD-10-CM

## 2021-08-16 LAB — POCT URINE PREGNANCY: Preg Test, Ur: NEGATIVE

## 2021-08-16 MED ORDER — ETHYNODIOL DIAC-ETH ESTRADIOL 1-35 MG-MCG PO TABS: 1.0000 | ORAL_TABLET | Freq: Every day | ORAL | 11 refills | Status: DC

## 2021-08-16 NOTE — Progress Notes (Signed)
GYN patient presents for birth control options, prefers oral contraceptive. Pregnancy test neg 08/16/21.

## 2021-08-16 NOTE — Progress Notes (Signed)
GYNECOLOGY OFFICE VISIT NOTE  History:   Ashley [name] is a 26 y.o. [id number] here today for OCP initiation. She denies any abnormal vaginal discharge, bleeding, pelvic pain or other concerns.    Past Medical History:  Diagnosis Date   Concussion without loss of consciousness 12/02/2014    Past Surgical History:  Procedure Laterality Date   KNEE SURGERY Left 12/2019    The following portions of the patient's history were reviewed and updated as appropriate: allergies, current medications, past family history, past medical history, past social history, past surgical history and problem list.   Health Maintenance:  Normal pap on 06/12/2020.   Review of Systems:  Pertinent items noted in HPI and remainder of comprehensive ROS otherwise negative.  Physical Exam:  BP 121/83   Pulse 69   Ht 5\' 8"  (1.727 m)   Wt 155 lb 6.4 oz (70.5 kg)   LMP 08/02/2021 (Approximate) Comment: Irregular cycles  BMI 23.63 kg/m  CONSTITUTIONAL: Well-developed, well-nourished female in no acute distress.  HEENT:  Normocephalic, atraumatic. External right and left ear normal. No scleral icterus.  NECK: Normal range of motion, supple, no masses noted on observation SKIN: No rash noted. Not diaphoretic. No erythema. No pallor. MUSCULOSKELETAL: Normal range of motion. No edema noted. NEUROLOGIC: Alert and oriented to person, place, and time. Normal muscle tone coordination. No cranial nerve deficit noted. PSYCHIATRIC: Normal mood and affect. Normal behavior. Normal judgment and thought content. CARDIOVASCULAR: Normal heart rate noted RESPIRATORY: Effort and breath sounds normal, no problems with respiration noted ABDOMEN: No masses noted. No other overt distention noted.   PELVIC: Deferred  Labs and Imaging Results for orders placed or performed in visit on 08/16/21 (from the past 168 hour(s))  POCT urine pregnancy   Collection Time: 08/16/21 12:10 PM  Result Value Ref Range   Preg Test, Ur Negative  Negative      Assessment and Plan:     1. Initiation of oral contraception Has tried Depo Provera, Avianne, Ortho-Novum and has breakthrough bleeding. Will try Zovia with a different progestin to see if this will help. If still a lot of breakthrough bleeding, consider Yasmin or Slynd.  The use of the oral contraceptive has been fully discussed with the patient. This includes the proper method to initiate (i.e. Sunday start after next normal menstrual onset versus same day start) and continue the pills, the need for regular compliance to ensure adequate contraceptive effect, the physiology which make the pill effective, the instructions for what to do in event of a missed pill, and warnings about anticipated minor side effects such as breakthrough spotting, nausea, breast tenderness, weight changes, acne, headaches, etc.  She has been told of the more serious potential side effects such as MI, stroke, and deep vein thrombosis, all of which are very unlikely.  She has been asked to report any signs of such serious problems immediately.  She should back up the pill with a condom during any cycle in which antibiotics are prescribed, and during the first cycle as well. The need for additional protection, such as a condom, to prevent exposure to sexually transmitted diseases has also been discussed- the patient has been clearly reminded that OCP's cannot protect her against diseases such as HIV and others. She understands and wishes to take the medication as prescribed - ethynodiol-ethinyl estradiol (ZOVIA) 1-35 MG-MCG tablet; Take 1 tablet by mouth daily.  Dispense: 28 tablet; Refill: 11 - POCT urine pregnancy  Routine preventative health maintenance measures emphasized. Please  refer to After Visit Summary for other counseling recommendations.   Return in about 2 months (around 10/17/2021) for OCP/BP Check.    I spent 20 minutes dedicated to the care of this patient including pre-visit review of records,  face to face time with the patient discussing her conditions and treatments and post visit orders.    Jaynie Collins, MD, FACOG Obstetrician & Gynecologist, [facility] for [employer], [facility]

## 2021-09-20 ENCOUNTER — Telehealth: Payer: Medicaid Other | Admitting: Family Medicine

## 2021-09-20 DIAGNOSIS — R079 Chest pain, unspecified: Secondary | ICD-10-CM

## 2021-09-20 NOTE — Progress Notes (Signed)
[place]   Questionable heartburn vs cardiac given platform in person is needed to rule out more serious causes of chest pain. Additionally, she has never been formally Dx with heartburn.

## 2021-09-26 ENCOUNTER — Telehealth: Payer: Medicaid Other | Admitting: Physician Assistant

## 2021-09-26 DIAGNOSIS — K219 Gastro-esophageal reflux disease without esophagitis: Secondary | ICD-10-CM

## 2021-09-26 MED ORDER — OMEPRAZOLE 20 MG PO CPDR: 20.0000 mg | DELAYED_RELEASE_CAPSULE | Freq: Every day | ORAL | 0 refills | Status: DC

## 2021-09-26 NOTE — Progress Notes (Signed)
E-Visit for Heartburn ° °We are sorry that you are not feeling well.  Here is how we plan to help! ° °Based on what you shared with me it looks like you most likely have Gastroesophageal Reflux Disease (GERD) ° °Gastroesophageal reflux disease (GERD) happens when acid from your stomach flows up into the esophagus.  When acid comes in contact with the esophagus, the acid causes sorenss (inflammation) in the esophagus.  Over time, GERD may create small holes (ulcers) in the lining of the esophagus. ° °I have prescribed Omeprazole 20 mg one by mouth daily until you follow up with a provider. ° °Your symptoms should improve in the next day or two.  You can use antacids as needed until symptoms resolve.  Call us if your heartburn worsens, you have trouble swallowing, weight loss, spitting up blood or recurrent vomiting. ° °Home Care: °May include lifestyle changes such as weight loss, quitting smoking and alcohol consumption °Avoid foods and drinks that make your symptoms worse, such as: °Caffeine or alcoholic drinks °Chocolate °Peppermint or mint flavorings °Garlic and onions °Spicy foods °Citrus fruits, such as oranges, lemons, or limes °Tomato-based foods such as sauce, chili, salsa and pizza °Fried and fatty foods °Avoid lying down for 3 hours prior to your bedtime or prior to taking a nap °Eat small, frequent meals instead of a large meals °Wear loose-fitting clothing.  Do not wear anything tight around your waist that causes pressure on your stomach. °Raise the head of your bed 6 to 8 inches with wood blocks to help you sleep.  Extra pillows will not help. ° °Seek Help Right Away If: °You have pain in your arms, neck, jaw, teeth or back °Your pain increases or changes in intensity or duration °You develop nausea, vomiting or sweating (diaphoresis) °You develop shortness of breath or you faint °Your vomit is green, yellow, black or looks like coffee grounds or blood °Your stool is red, bloody or black ° °These  symptoms could be signs of other problems, such as heart disease, gastric bleeding or esophageal bleeding. ° °Make sure you : °Understand these instructions. °Will watch your condition. °Will get help right away if you are not doing well or get worse. ° °Your e-visit answers were reviewed by a board certified advanced clinical practitioner to complete your personal care plan.  Depending on the condition, your plan could have included both over the counter or prescription medications. ° °If there is a problem please reply  once you have received a response from your provider. ° °Your safety is important to us.  If you have drug allergies check your prescription carefully.   ° °You can use MyChart to ask questions about today’s visit, request a non-urgent call back, or ask for a work or school excuse for 24 hours related to this e-Visit. If it has been greater than 24 hours you will need to follow up with your provider, or enter a new e-Visit to address those concerns. ° °You will get an e-mail in the next two days asking about your experience.  I hope that your e-visit has been valuable and will speed your recovery. Thank you for using e-visits. ° ° °I provided 5 minutes of non face-to-face time during this encounter for chart review and documentation.  ° °

## 2021-10-16 ENCOUNTER — Telehealth: Payer: Medicaid Other | Admitting: Physician Assistant

## 2021-10-16 DIAGNOSIS — N760 Acute vaginitis: Secondary | ICD-10-CM | POA: Diagnosis not present

## 2021-10-16 DIAGNOSIS — B9689 Other specified bacterial agents as the cause of diseases classified elsewhere: Secondary | ICD-10-CM | POA: Diagnosis not present

## 2021-10-16 MED ORDER — METRONIDAZOLE 500 MG PO TABS: 500.0000 mg | ORAL_TABLET | Freq: Two times a day (BID) | ORAL | 0 refills | Status: DC

## 2021-10-16 NOTE — Progress Notes (Signed)
Message sent to patient requesting further input regarding current symptoms. Awaiting patient response.  

## 2021-10-16 NOTE — Progress Notes (Signed)
I have spent 5 minutes in review of e-visit questionnaire, review and updating patient chart, medical decision making and response to patient.   Yates Weisgerber Cody Maimouna Rondeau, PA-C    

## 2021-10-16 NOTE — Progress Notes (Signed)
E-Visit for Vaginal Symptoms  We are sorry that you are not feeling well. Here is how we plan to help! Based on what you shared with me it looks like you: May have a vaginosis due to bacteria  Vaginosis is an inflammation of the vagina that can result in discharge, itching and pain. The cause is usually a change in the normal balance of vaginal bacteria or an infection. Vaginosis can also result from reduced estrogen levels after menopause.  The most common causes of vaginosis are:   Bacterial vaginosis which results from an overgrowth of one on several organisms that are normally present in your vagina.   Yeast infections which are caused by a naturally occurring fungus called candida.   Vaginal atrophy (atrophic vaginosis) which results from the thinning of the vagina from reduced estrogen levels after menopause.   Trichomoniasis which is caused by a parasite and is commonly transmitted by sexual intercourse.  Factors that increase your risk of developing vaginosis include: Medications, such as antibiotics and steroids Uncontrolled diabetes Use of hygiene products such as bubble bath, vaginal spray or vaginal deodorant Douching Wearing damp or tight-fitting clothing Using an intrauterine device (IUD) for birth control Hormonal changes, such as those associated with pregnancy, birth control pills or menopause Sexual activity Having a sexually transmitted infection  Your treatment plan is Metronidazole or Flagyl 500mg  twice a day for 7 days.  I have electronically sent this prescription into the pharmacy that you have chosen.  Be sure to take all of the medication as directed. Stop taking any medication if you develop a rash, tongue swelling or shortness of breath. Mothers who are breast feeding should consider pumping and discarding their breast milk while on these antibiotics. However, there is no consensus that infant exposure at these doses would be harmful.  Remember that  medication creams can weaken latex condoms.  If not resolving, or any new symptoms despite treatment, you need an in-person evaluation, especially giving new sexual partner.   HOME CARE:  Good hygiene may prevent some types of vaginosis from recurring and may relieve some symptoms:  Avoid baths, hot tubs and whirlpool spas. Rinse soap from your outer genital area after a shower, and dry the area well to prevent irritation. Don't use scented or harsh soaps, such as those with deodorant or antibacterial action. Avoid irritants. These include scented tampons and pads. Wipe from front to back after using the toilet. Doing so avoids spreading fecal bacteria to your vagina.  Other things that may help prevent vaginosis include:  Don't douche. Your vagina doesn't require cleansing other than normal bathing. Repetitive douching disrupts the normal organisms that reside in the vagina and can actually increase your risk of vaginal infection. Douching won't clear up a vaginal infection. Use a latex condom. Both female and female latex condoms may help you avoid infections spread by sexual contact. Wear cotton underwear. Also wear pantyhose with a cotton crotch. If you feel comfortable without it, skip wearing underwear to bed. Yeast thrives in Your symptoms should improve in the next day or two.  GET HELP RIGHT AWAY IF:  You have pain in your lower abdomen ( pelvic area or over your ovaries) You develop nausea or vomiting You develop a fever Your discharge changes or worsens You have persistent pain with intercourse You develop shortness of breath, a rapid pulse, or you faint.  These symptoms could be signs of problems or infections that need to be evaluated by a medical  provider now.  MAKE SURE YOU   Understand these instructions. Will watch your condition. Will get help right away if you are not doing well or get worse.  Thank you for choosing an e-visit.  Your e-visit  answers were reviewed by a board certified advanced clinical practitioner to complete your personal care plan. Depending upon the condition, your plan could have included both over the counter or prescription medications.  Please review your pharmacy choice. Make sure the pharmacy is open so you can pick up prescription now. If there is a problem, you may contact your provider through [employer] and have the prescription routed to another pharmacy.  Your safety is important to [place]. If you have drug allergies check your prescription carefully.   For the next 24 hours you can use MyChart to ask questions about today's visit, request a non-urgent call back, or ask for a work or school excuse. You will get an email in the next two days asking about your experience. I hope that your e-visit has been valuable and will speed your recovery.

## 2021-10-17 ENCOUNTER — Ambulatory Visit (INDEPENDENT_AMBULATORY_CARE_PROVIDER_SITE_OTHER): Payer: Medicaid Other | Admitting: Student

## 2021-10-17 ENCOUNTER — Encounter: Payer: Self-pay | Admitting: Student

## 2021-10-17 VITALS — BP 115/74 | HR 78 | Ht 68.0 in | Wt 148.0 lb

## 2021-10-17 DIAGNOSIS — Z3041 Encounter for surveillance of contraceptive pills: Secondary | ICD-10-CM | POA: Diagnosis not present

## 2021-10-17 NOTE — Progress Notes (Signed)
  History:  [name] is a 26 y.o. [id number] who presents to clinic today for OCP surveillance and follow-up. Patient reports that she is happy with the pill. Patient has noted her cycle becoming regular. Negative for undesirable side effects.   The following portions of the patient's history were reviewed and updated as appropriate: allergies, current medications, family history, past medical history, social history, past surgical history and problem list.  Review of Systems:  Review of Systems  All other systems reviewed and are negative.     Objective:  Physical Exam BP 115/74   Pulse 78   Ht 5\' 8"  (1.727 m)   Wt 148 lb (67.1 kg)   LMP 10/08/2021 (Exact Date)   BMI 22.50 kg/m  Physical Exam Constitutional:      Appearance: Normal appearance. She is normal weight.  Cardiovascular:     Rate and Rhythm: Normal rate.  Abdominal:     General: Bowel sounds are normal.     Palpations: Abdomen is soft.  Skin:    General: Skin is warm and dry.  Neurological:     Mental Status: She is alert.    Labs and Imaging No results found for this or any previous visit (from the past 24 hour(s)).  No results found.   Assessment & Plan:  1. Oral contraceptive pill surveillance - Plan to continue with OCP. BP normal in office and at home. Encouraged continued monitoring for side effects that would require medical attention.  F/U PRN or in 1 year for annual.    Approximately 10 minutes of face-to-face time was spent with this patient   12/08/2021, NP 10/17/2021 4:26 PM

## 2021-10-17 NOTE — Progress Notes (Addendum)
26 y.o GYN presents for OCP FU.  Reports no problems today.

## 2021-10-20 ENCOUNTER — Ambulatory Visit (INDEPENDENT_AMBULATORY_CARE_PROVIDER_SITE_OTHER): Payer: Medicaid Other

## 2021-10-20 ENCOUNTER — Ambulatory Visit
Admission: EM | Admit: 2021-10-20 | Discharge: 2021-10-20 | Disposition: A | Discharge status: Home / Self Care | Payer: Medicaid Other | Attending: Urgent Care | Admitting: Urgent Care

## 2021-10-20 DIAGNOSIS — M25562 Pain in left knee: Secondary | ICD-10-CM | POA: Diagnosis not present

## 2021-10-20 DIAGNOSIS — Z87828 Personal history of other (healed) physical injury and trauma: Secondary | ICD-10-CM

## 2021-10-20 DIAGNOSIS — Z9889 Other specified postprocedural states: Secondary | ICD-10-CM

## 2021-10-20 MED ORDER — PREDNISONE 20 MG PO TABS: ORAL_TABLET | ORAL | 0 refills | Status: DC

## 2021-10-20 NOTE — ED Provider Notes (Signed)
[facility]  Note:  This document was prepared using Systems analyst and may include unintentional dictation errors.  MRN: [id number] DOB: 02-03-1996  Subjective:   Makyiah Bin is a 26 y.o. female presenting for 2 day history of acute onset severe left knee pain around and behind her patella. No known injury, trauma, fall. Had knee arthroscopy in 2021. Does not have an orthopedist currently. No history of gout. Patient did try Aleve with some improvement but not today.   No current facility-administered medications for this encounter.  Current Outpatient Medications:    miconazole (MONISTAT 7) 2 % vaginal cream, Place 1 Applicatorful vaginally at bedtime. Apply for seven nights (Patient not taking: Reported on 08/16/2021), Disp: 30 g, Rfl: 2   ethynodiol-ethinyl estradiol (ZOVIA) 1-35 MG-MCG tablet, Take 1 tablet by mouth daily., Disp: 28 tablet, Rfl: 11   metroNIDAZOLE (FLAGYL) 500 MG tablet, Take 1 tablet (500 mg total) by mouth 2 (two) times daily., Disp: 14 tablet, Rfl: 0   omeprazole (PRILOSEC) 20 MG capsule, Take 1 capsule (20 mg total) by mouth daily., Disp: 30 capsule, Rfl: 0   SYMBICORT 160-4.5 MCG/ACT inhaler, SMARTSIG:2 Puff(s) By Mouth Twice Daily, Disp: , Rfl:    valACYclovir (VALTREX) 500 MG tablet, TAKE AS DIRECTED FOR 3 DAYS AS NEEDED FOR EACH OUTBREAK (Patient not taking: Reported on 07/30/2021), Disp: 90 tablet, Rfl: 3   VENTOLIN HFA 108 (90 Base) MCG/ACT inhaler, SMARTSIG:1 Puff(s) By Mouth Every 4-6 Hours PRN, Disp: , Rfl:    Allergies  Allergen Reactions   Penicillins Anaphylaxis, Hives and Rash    Foamed from mouth Has patient had a PCN reaction causing immediate rash, facial/tongue/throat swelling, SOB or lightheadedness with hypotension: Yes Has patient had a PCN reaction causing severe rash involving mucus membranes or skin necrosis: No Has patient had a PCN reaction that required hospitalization No Has patient had a  PCN reaction occurring within the last 10 years: No If all of the above answers are "NO", then may proceed with Cephalosporin use.     Past Medical History:  Diagnosis Date   Concussion without loss of consciousness 12/02/2014     Past Surgical History:  Procedure Laterality Date   KNEE SURGERY Left 12/2019    Family History  Problem Relation Age of Onset   Diabetes Mother    Hypertension Mother    Diabetes Sister    Hypertension Sister     Social History   Tobacco Use   Smoking status: Never   Smokeless tobacco: Never  Vaping Use   Vaping Use: Never used  Substance Use Topics   Alcohol use: Yes    Comment: occ   Drug use: No    ROS   Objective:   Vitals: BP 116/69 (BP Location: Left Arm)   Pulse 74   Temp 98.5 F (36.9 C) (Oral)   Resp 18   LMP 10/08/2021 (Exact Date)   SpO2 98%   Physical Exam Constitutional:      General: She is not in acute distress.    Appearance: Normal appearance. She is well-developed. She is not ill-appearing, toxic-appearing or diaphoretic.  HENT:     Head: Normocephalic and atraumatic.     Nose: Nose normal.     Mouth/Throat:     Mouth: Mucous membranes are moist.  Eyes:     General: No scleral icterus.       Right eye: No discharge.        Left eye: No  discharge.     Extraocular Movements: Extraocular movements intact.  Cardiovascular:     Rate and Rhythm: Normal rate.  Pulmonary:     Effort: Pulmonary effort is normal.  Musculoskeletal:     Left knee: Bony tenderness (behind patella) present. No swelling, deformity, effusion, erythema, ecchymosis, lacerations or crepitus. Decreased range of motion. Tenderness present over the patellar tendon. No medial joint line or lateral joint line tenderness. Normal alignment and normal patellar mobility.  Skin:    General: Skin is warm and dry.  Neurological:     General: No focal deficit present.     Mental Status: She is alert and oriented to person, place, and time.   Psychiatric:        Mood and Affect: Mood normal.        Behavior: Behavior normal.     DG Knee Complete 4 Views Left  Result Date: 10/20/2021 CLINICAL DATA:  Pain EXAM: LEFT KNEE - COMPLETE 4+ VIEW COMPARISON:  10/09/2011 FINDINGS: No fracture or dislocation is seen. There is no effusion. Small bony spurs are seen in the patella. IMPRESSION: No recent fracture or dislocation is seen in left knee. Electronically Signed   By: Ernie Avena M.D.   On: 10/20/2021 10:29    Applied an Ace wrap to the left knee.   Assessment and Plan :   PDMP not reviewed this encounter.  1. Acute pain of left knee   2. History of dislocation of knee   3. History of arthroscopy of left knee    Undifferentiated severe left knee pain. Suspect inflammatory pain, possible ligament or meniscus source given knee buckling. Patient prefers aggressive management and therefore will use an oral prednisone course, RICE method. Follow up with ortho. Counseled patient on potential for adverse effects with medications prescribed/recommended today, ER and return-to-clinic precautions discussed, patient verbalized understanding.    Wallis Bamberg, PA-C 10/20/21 1148

## 2021-10-20 NOTE — ED Notes (Addendum)
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    On Thursday knee was aching and patient states she has been using her Voltran gel and icing and that does not seem to be helping. Patient states feels like her knee is tight and when she straightens her knee she feel tension behind the knee cap.   Pain when straightening 10/10 when relaxing no pain  NO Self Harmful thought

## 2021-10-20 NOTE — ED Triage Notes (Signed)
Left Knee Pain    Previous dislocations in High School in 2021 had a knee scope to remove scar tissue and loose bone fragments.     On Thursday knee was aching and patient states she has been using her Voltran gel and icing and that does not seem to be helping. Patient states feels like her knee is tight and when she straightens her knee she feel tension behind the knee cap.

## 2021-10-22 ENCOUNTER — Other Ambulatory Visit (HOSPITAL_COMMUNITY)
Admission: RE | Admit: 2021-10-22 | Discharge: 2021-10-22 | Disposition: A | Discharge status: Home / Self Care | Payer: Medicaid Other | Source: Ambulatory Visit | Attending: Obstetrics and Gynecology | Admitting: Obstetrics and Gynecology

## 2021-10-22 ENCOUNTER — Ambulatory Visit (INDEPENDENT_AMBULATORY_CARE_PROVIDER_SITE_OTHER): Payer: Medicaid Other | Admitting: General Practice

## 2021-10-22 VITALS — BP 123/83 | HR 79 | Ht 68.0 in | Wt 149.3 lb

## 2021-10-22 DIAGNOSIS — Z202 Contact with and (suspected) exposure to infections with a predominantly sexual mode of transmission: Secondary | ICD-10-CM | POA: Insufficient documentation

## 2021-10-22 NOTE — Progress Notes (Signed)
SUBJECTIVE:  26 y.o. female presents for STD testing for possible exposure. Denies abnormal vaginal bleeding or significant pelvic pain or fever. No UTI symptoms. Pt recently has new partner and is unsure of exposure.  Patient's last menstrual period was 10/08/2021 (exact date).  OBJECTIVE:  She appears well, afebrile. Urine dipstick: not done.  ASSESSMENT:  Vaginal Discharge  Vaginal Odor   PLAN:  GC, chlamydia, trichomonas, BVAG, CVAG probe sent to lab. Treatment: To be determined once lab results are received ROV prn if symptoms persist or worsen.

## 2021-10-23 LAB — CERVICOVAGINAL ANCILLARY ONLY
Bacterial Vaginitis (gardnerella): NEGATIVE
Candida Glabrata: NEGATIVE
Candida Vaginitis: POSITIVE — AB
Chlamydia: NEGATIVE
Comment: NEGATIVE
Comment: NEGATIVE
Comment: NEGATIVE
Comment: NEGATIVE
Comment: NEGATIVE
Comment: NORMAL
Neisseria Gonorrhea: NEGATIVE
Trichomonas: NEGATIVE

## 2021-10-23 MED ORDER — FLUCONAZOLE 150 MG PO TABS: 150.0000 mg | ORAL_TABLET | Freq: Once | ORAL | 0 refills | Status: AC

## 2021-10-23 NOTE — Addendum Note (Signed)
Addended by: Mora Bellman on: 10/23/2021 01:45 PM   Modules accepted: Orders

## 2021-10-30 ENCOUNTER — Telehealth: Payer: Medicaid Other | Admitting: Family Medicine

## 2021-10-30 DIAGNOSIS — H6690 Otitis media, unspecified, unspecified ear: Secondary | ICD-10-CM | POA: Diagnosis not present

## 2021-10-30 MED ORDER — DOXYCYCLINE HYCLATE 100 MG PO TABS: 100.0000 mg | ORAL_TABLET | Freq: Two times a day (BID) | ORAL | 0 refills | Status: AC

## 2021-10-30 NOTE — Progress Notes (Signed)
E Visit for ear infection  We are sorry that you are not feeling well. Here is how we plan to help!   Doxycycline 100 mg twice daily for 7 days  Your symptoms should improve over the next 3 days and should resolve in about 7 days.  HOME CARE:  Wash your hands frequently. Do not place the tip of the bottle on your ear or touch it with your fingers. You can take Acetominophen 650 mg every 4-6 hours as needed for pain.  If pain is severe or moderate, you can apply a heating pad (set on low) or hot water bottle (wrapped in a towel) to outer ear for 20 minutes.  This will also increase drainage. Avoid ear plugs Do not use Q-tips After showers, help the water run out by tilting your head to one side.  GET HELP RIGHT AWAY IF:  Fever is over 102.2 degrees. You develop progressive ear pain or hearing loss. Ear symptoms persist longer than 3 days after treatment.  MAKE SURE YOU:  Understand these instructions. Will watch your condition. Will get help right away if you are not doing well or get worse.     Thank you for choosing an e-visit.  Your e-visit answers were reviewed by a board certified advanced clinical practitioner to complete your personal care plan. Depending upon the condition, your plan could have included both over the counter or prescription medications.  Please review your pharmacy choice. Make sure the pharmacy is open so you can pick up prescription now. If there is a problem, you may contact your provider through [employer] and have the prescription routed to another pharmacy.  Your safety is important to [place]. If you have drug allergies check your prescription carefully.   For the next 24 hours you can use MyChart to ask questions about today's visit, request a non-urgent call back, or ask for a work or school excuse. You will get an email in the next two days asking about your experience. I hope that your e-visit has been valuable and will speed your  recovery.  I provided 5 minutes of non face-to-face time during this encounter for chart review, medication and order placement, as well as and documentation.

## 2021-11-20 ENCOUNTER — Other Ambulatory Visit (HOSPITAL_COMMUNITY)
Admission: RE | Admit: 2021-11-20 | Discharge: 2021-11-20 | Disposition: A | Discharge status: Home / Self Care | Payer: Medicaid Other | Source: Ambulatory Visit | Attending: Obstetrics and Gynecology | Admitting: Obstetrics and Gynecology

## 2021-11-20 ENCOUNTER — Ambulatory Visit (INDEPENDENT_AMBULATORY_CARE_PROVIDER_SITE_OTHER): Payer: Medicaid Other | Admitting: General Practice

## 2021-11-20 VITALS — BP 125/78 | HR 81 | Ht 68.0 in | Wt 148.2 lb

## 2021-11-20 DIAGNOSIS — Z202 Contact with and (suspected) exposure to infections with a predominantly sexual mode of transmission: Secondary | ICD-10-CM | POA: Insufficient documentation

## 2021-11-20 NOTE — Progress Notes (Signed)
SUBJECTIVE:  26 y.o. female complains vaginal itch ad discomfort. Pt states partner c/o penial discharge and burning with urination.   Denies abnormal vaginal bleeding or significant pelvic pain or fever. No UTI symptoms. Unsure of exposure to STD.  No LMP recorded.  OBJECTIVE:  She appears well, afebrile. Urine dipstick: not done.  ASSESSMENT:  Vaginal Discharge  Vaginal Odor   PLAN:  GC, chlamydia, trichomonas, BVAG, CVAG probe sent to lab. Treatment: To be determined once lab results are received ROV prn if symptoms persist or worsen.

## 2021-11-21 ENCOUNTER — Ambulatory Visit
Admission: RE | Admit: 2021-11-21 | Discharge: 2021-11-21 | Disposition: A | Discharge status: Home / Self Care | Payer: Medicaid Other | Source: Ambulatory Visit | Attending: Urgent Care | Admitting: Urgent Care

## 2021-11-21 ENCOUNTER — Telehealth: Payer: Medicaid Other | Admitting: Family

## 2021-11-21 VITALS — BP 111/73 | HR 89 | Temp 98.5°F | Resp 16

## 2021-11-21 DIAGNOSIS — H9201 Otalgia, right ear: Secondary | ICD-10-CM | POA: Diagnosis not present

## 2021-11-21 DIAGNOSIS — J029 Acute pharyngitis, unspecified: Secondary | ICD-10-CM

## 2021-11-21 DIAGNOSIS — H6991 Unspecified Eustachian tube disorder, right ear: Secondary | ICD-10-CM | POA: Diagnosis not present

## 2021-11-21 LAB — CERVICOVAGINAL ANCILLARY ONLY
Bacterial Vaginitis (gardnerella): NEGATIVE
Candida Glabrata: NEGATIVE
Candida Vaginitis: POSITIVE — AB
Chlamydia: NEGATIVE
Comment: NEGATIVE
Comment: NEGATIVE
Comment: NEGATIVE
Comment: NEGATIVE
Comment: NEGATIVE
Comment: NORMAL
Neisseria Gonorrhea: POSITIVE — AB
Trichomonas: NEGATIVE

## 2021-11-21 LAB — HEPATITIS C ANTIBODY: Hep C Virus Ab: NONREACTIVE

## 2021-11-21 LAB — RPR: RPR Ser Ql: NONREACTIVE

## 2021-11-21 LAB — HIV ANTIBODY (ROUTINE TESTING W REFLEX): HIV Screen 4th Generation wRfx: NONREACTIVE

## 2021-11-21 LAB — HEPATITIS B SURFACE ANTIGEN: Hepatitis B Surface Ag: NEGATIVE

## 2021-11-21 MED ORDER — CETIRIZINE HCL 10 MG PO TABS: 10.0000 mg | ORAL_TABLET | Freq: Every day | ORAL | 0 refills | Status: DC

## 2021-11-21 MED ORDER — FLUTICASONE PROPIONATE 50 MCG/ACT NA SUSP
2.0000 | Freq: Every day | NASAL | 12 refills | Status: DC
Start: 2021-11-21 — End: 2022-01-03

## 2021-11-21 MED ORDER — PSEUDOEPHEDRINE HCL 60 MG PO TABS: 60.0000 mg | ORAL_TABLET | Freq: Four times a day (QID) | ORAL | 0 refills | Status: DC | PRN

## 2021-11-21 MED ORDER — NAPROXEN 500 MG PO TABS: 500.0000 mg | ORAL_TABLET | Freq: Two times a day (BID) | ORAL | 0 refills | Status: DC

## 2021-11-21 MED ORDER — AZITHROMYCIN 250 MG PO TABS: ORAL_TABLET | ORAL | 0 refills | Status: DC

## 2021-11-21 NOTE — Discharge Instructions (Addendum)
Start Flonase and Zyrtec daily through the end of November. Use pseudoephedrine the rest of this week and then as needed. Ibuprofen can be used for pain relief as needed.   Just in case you have no improvement and definitely if you are worsening by Sunday, go ahead and fill the prescription for azithromycin to address an ongoing ear infection.

## 2021-11-21 NOTE — ED Provider Notes (Signed)
[facility]  Note:  This document was prepared using Conservation officer, historic buildingsDragon voice recognition software and may include unintentional dictation errors.  MRN: [id number] DOB: 09-28-1995  Subjective:   Ashley [name] is a 26 y.o. female presenting for 1 month history of persistent intermittent right ear pain, right ear fullness, popping.  Symptoms radiate into the neck and jaw on the right side.  Patient underwent a course of doxycycline as prescribed from an ED visit on 10/30/2021.  No drainage, tinnitus, fever.  Regarding her penicillin allergy, states that it is remote when she was a child and all that she can remember is that she had a rash and was "foaming at the mouth".  Chart review shows that she has previously been prescribed Keflex and then switched to a different antibiotic rather quickly.  Patient cannot remember if she has ever taken Keflex.  No current facility-administered medications for this encounter.  Current Outpatient Medications:    miconazole (MONISTAT 7) 2 % vaginal cream, Place 1 Applicatorful vaginally at bedtime. Apply for seven nights (Patient not taking: Reported on 08/16/2021), Disp: 30 g, Rfl: 2   ethynodiol-ethinyl estradiol (ZOVIA) 1-35 MG-MCG tablet, Take 1 tablet by mouth daily., Disp: 28 tablet, Rfl: 11   metroNIDAZOLE (FLAGYL) 500 MG tablet, Take 1 tablet (500 mg total) by mouth 2 (two) times daily. (Patient not taking: Reported on 11/20/2021), Disp: 14 tablet, Rfl: 0   omeprazole (PRILOSEC) 20 MG capsule, Take 1 capsule (20 mg total) by mouth daily. (Patient not taking: Reported on 11/20/2021), Disp: 30 capsule, Rfl: 0   predniSONE (DELTASONE) 20 MG tablet, Take 2 tablets daily with breakfast. (Patient not taking: Reported on 11/20/2021), Disp: 10 tablet, Rfl: 0   SYMBICORT 160-4.5 MCG/ACT inhaler, SMARTSIG:2 Puff(s) By Mouth Twice Daily, Disp: , Rfl:    valACYclovir (VALTREX) 500 MG tablet, TAKE AS DIRECTED FOR 3 DAYS AS NEEDED FOR EACH OUTBREAK, Disp:  90 tablet, Rfl: 3   VENTOLIN HFA 108 (90 Base) MCG/ACT inhaler, SMARTSIG:1 Puff(s) By Mouth Every 4-6 Hours PRN, Disp: , Rfl:    Allergies  Allergen Reactions   Penicillins Anaphylaxis, Hives and Rash    Foamed from mouth Has patient had a PCN reaction causing immediate rash, facial/tongue/throat swelling, SOB or lightheadedness with hypotension: Yes Has patient had a PCN reaction causing severe rash involving mucus membranes or skin necrosis: No Has patient had a PCN reaction that required hospitalization No Has patient had a PCN reaction occurring within the last 10 years: No If all of the above answers are "NO", then may proceed with Cephalosporin use.     Past Medical History:  Diagnosis Date   Concussion without loss of consciousness 12/02/2014     Past Surgical History:  Procedure Laterality Date   KNEE SURGERY Left 12/2019    Family History  Problem Relation Age of Onset   Diabetes Mother    Hypertension Mother    Diabetes Sister    Hypertension Sister     Social History   Tobacco Use   Smoking status: Never   Smokeless tobacco: Never  Vaping Use   Vaping Use: Never used  Substance Use Topics   Alcohol use: Not Currently    Comment: occ   Drug use: No    ROS   Objective:   Vitals: BP 111/73 (BP Location: Right Arm)   Pulse 89   Temp 98.5 F (36.9 C) (Oral)   Resp 16   LMP 10/08/2021   SpO2 98%   Physical  Exam Constitutional:      General: She is not in acute distress.    Appearance: Normal appearance. She is well-developed. She is not ill-appearing, toxic-appearing or diaphoretic.  HENT:     Head: Normocephalic and atraumatic.     Right Ear: Tympanic membrane, ear canal and external ear normal. No tenderness. There is no impacted cerumen. Tympanic membrane is not injected, perforated, erythematous or bulging.     Left Ear: Tympanic membrane, ear canal and external ear normal. No tenderness. There is no impacted cerumen. Tympanic membrane is  not injected, perforated, erythematous or bulging.     Nose: Nose normal.     Mouth/Throat:     Mouth: Mucous membranes are moist.  Eyes:     General: No scleral icterus.       Right eye: No discharge.        Left eye: No discharge.     Extraocular Movements: Extraocular movements intact.  Cardiovascular:     Rate and Rhythm: Normal rate.  Pulmonary:     Effort: Pulmonary effort is normal.  Skin:    General: Skin is warm and dry.  Neurological:     General: No focal deficit present.     Mental Status: She is alert and oriented to person, place, and time.  Psychiatric:        Mood and Affect: Mood normal.        Behavior: Behavior normal.     Results for orders placed or performed in visit on 11/20/21 (from the past 24 hour(s))  HIV antibody (with reflex)     Status: None   Collection Time: 11/20/21  4:06 PM  Result Value Ref Range   HIV Screen 4th Generation wRfx Non Reactive Non Reactive   Narrative   Performed at:  [facility] [address] [place], [place]  [id number] Lab Director: Rush Farmer MD, Phone:  [phone]  RPR     Status: None   Collection Time: 11/20/21  4:06 PM  Result Value Ref Range   RPR Ser Ql Non Reactive Non Reactive   Narrative   Performed at:  [facility] [address] [place], [place]  [id number] Lab Director: Rush Farmer MD, Phone:  [phone]  Hepatitis C Antibody     Status: None   Collection Time: 11/20/21  4:06 PM  Result Value Ref Range   Hep C Virus Ab Non Reactive Non Reactive   Narrative   Performed at:  [facility] [address], [place], [place]  [id number] Lab Director: Rush Farmer MD, Phone:  [phone]  Hepatitis B Surface AntiGEN     Status: None   Collection Time: 11/20/21  4:06 PM  Result Value Ref Range   Hepatitis B Surface Ag Negative Negative   Narrative   Performed at:  [facility] [address], [place], [place]  [id number] Lab Director: Rush Farmer MD,  Phone:  [phone]    Assessment and Plan :   PDMP not reviewed this encounter.  1. Eustachian tube dysfunction, right   2. Right ear pain     Unremarkable ENT exam.  Will use conservative management for what I suspect is eustachian tube dysfunction.  Recommended starting Flonase, Zyrtec, Sudafed.  I did offer patient a prescription for azithromycin has a different antibiotic and doxycycline to address an ongoing underlying otitis media.  Counseled patient on potential for adverse effects with medications prescribed/recommended today, ER and return-to-clinic precautions discussed, patient verbalized understanding.  Jaynee Eagles, [place] 11/21/21 [phone]

## 2021-11-21 NOTE — Progress Notes (Signed)
E-Visit for Sore Throat - Strep Symptoms  We are sorry that you are not feeling well.  Here is how we plan to help!  Based on what you have shared with me it is likely that you have strep pharyngitis.  Strep pharyngitis is inflammation and infection in the back of the throat.  This is an infection cause by bacteria and is treated with antibiotics.  It appears you were seen today and given a Zpak. This will treat strep throat. I recommend taking this. For throat pain, we recommend over the counter oral pain relief medications such as acetaminophen or aspirin, or anti-inflammatory medications such as ibuprofen or naproxen sodium. Topical treatments such as oral throat lozenges or sprays may be used as needed. Strep infections are not as easily transmitted as other respiratory infections, however we still recommend that you avoid close contact with loved ones, especially the very young and elderly.  Remember to wash your hands thoroughly throughout the day as this is the number one way to prevent the spread of infection and wipe down door knobs and counters with disinfectant.   Home Care: Only take medications as instructed by your medical team. Complete the entire course of an antibiotic. Do not take these medications with alcohol. A steam or ultrasonic humidifier can help congestion.  You can place a towel over your head and breathe in the steam from hot water coming from a faucet. Avoid close contacts especially the very young and the elderly. Cover your mouth when you cough or sneeze. Always remember to wash your hands.  Get Help Right Away If: You develop worsening fever or sinus pain. You develop a severe head ache or visual changes. Your symptoms persist after you have completed your treatment plan.  Make sure you Understand these instructions. Will watch your condition. Will get help right away if you are not doing well or get worse.   Thank you for choosing an e-visit.  Your  e-visit answers were reviewed by a board certified advanced clinical practitioner to complete your personal care plan. Depending upon the condition, your plan could have included both over the counter or prescription medications.  Please review your pharmacy choice. Make sure the pharmacy is open so you can pick up prescription now. If there is a problem, you may contact your provider through [employer] and have the prescription routed to another pharmacy.  Your safety is important to [place]. If you have drug allergies check your prescription carefully.   For the next 24 hours you can use MyChart to ask questions about today's visit, request a non-urgent call back, or ask for a work or school excuse. You will get an email in the next two days asking about your experience. I hope that your e-visit has been valuable and will speed your recovery.  Approximately 5 minutes was spent documenting and reviewing patient's chart.

## 2021-11-21 NOTE — ED Triage Notes (Signed)
Pt states rt ear and neck pain and under her right jaw for a month. Was treated in September with and antibiotic with no improvement.

## 2021-11-22 MED ORDER — METRONIDAZOLE 500 MG PO TABS: 500.0000 mg | ORAL_TABLET | Freq: Two times a day (BID) | ORAL | 0 refills | Status: DC

## 2021-11-22 NOTE — Addendum Note (Signed)
Addended by: Mora Bellman on: 11/22/2021 11:48 AM   Modules accepted: Orders

## 2021-11-23 ENCOUNTER — Other Ambulatory Visit: Payer: Self-pay

## 2021-11-23 ENCOUNTER — Ambulatory Visit (INDEPENDENT_AMBULATORY_CARE_PROVIDER_SITE_OTHER): Payer: Medicaid Other | Admitting: General Practice

## 2021-11-23 DIAGNOSIS — Z202 Contact with and (suspected) exposure to infections with a predominantly sexual mode of transmission: Secondary | ICD-10-CM | POA: Diagnosis not present

## 2021-11-23 DIAGNOSIS — B3731 Acute candidiasis of vulva and vagina: Secondary | ICD-10-CM

## 2021-11-23 MED ORDER — FLUCONAZOLE 150 MG PO TABS: 150.0000 mg | ORAL_TABLET | Freq: Once | ORAL | 0 refills | Status: AC

## 2021-11-23 MED ORDER — CEFTRIAXONE SODIUM 500 MG IJ SOLR
500.0000 mg | Freq: Once | INTRAMUSCULAR | Status: AC
  Administered 2021-11-23: 500 mg via INTRAMUSCULAR

## 2021-11-23 NOTE — Progress Notes (Signed)
Pt presents for Rocephin injection. Pt tested positive for Gonorrhea 11-20-2021.  Pt states she is unsure if she has had rocephin before and does not remember reaction to penicillin in regard to drug interaction. Per Dr. Currie Paris, Rocephin ok to give.  Injection given in the RUOQ per pt request. Pt tolerated well. Pt will return in 4 weeks for TOC.  Diflucan sent for +yeast

## 2021-12-21 ENCOUNTER — Ambulatory Visit (INDEPENDENT_AMBULATORY_CARE_PROVIDER_SITE_OTHER): Payer: Medicaid Other | Admitting: General Practice

## 2021-12-21 ENCOUNTER — Other Ambulatory Visit (HOSPITAL_COMMUNITY)
Admission: RE | Admit: 2021-12-21 | Discharge: 2021-12-21 | Disposition: A | Discharge status: Home / Self Care | Payer: Medicaid Other | Source: Ambulatory Visit | Attending: Obstetrics and Gynecology | Admitting: Obstetrics and Gynecology

## 2021-12-21 VITALS — BP 125/85 | HR 74 | Ht 68.0 in | Wt 146.6 lb

## 2021-12-21 DIAGNOSIS — Z113 Encounter for screening for infections with a predominantly sexual mode of transmission: Secondary | ICD-10-CM

## 2021-12-21 DIAGNOSIS — Z202 Contact with and (suspected) exposure to infections with a predominantly sexual mode of transmission: Secondary | ICD-10-CM | POA: Diagnosis present

## 2021-12-21 NOTE — Progress Notes (Signed)
SUBJECTIVE:  26 y.o. female presents for TOC. Pt tested positive for yeast and Gonorrhea 11-20-21. Denies abnormal vaginal bleeding or significant pelvic pain or fever. No UTI symptoms. Denies history of known exposure to STD.  Pt c/o abdominal pain on 12-08-21 and abdominal pain with intercourse 3 days ago. Denies pain today, but states pain was from the waist, down. States pain was mild in pelvic region. Pt denies possibly of pregnancy and declined UPT today. Pt encouraged to monitor symptoms and schedule and appt to see and provider if symptoms return.   No LMP recorded.  OBJECTIVE:  She appears well, afebrile. Urine dipstick: not done.  ASSESSMENT:  Vaginal Discharge  Vaginal Odor   PLAN:  GC, chlamydia, trichomonas, BVAG, CVAG probe sent to lab. Treatment: To be determined once lab results are received ROV prn if symptoms persist or worsen.

## 2021-12-23 IMAGING — US US PELVIS COMPLETE WITH TRANSVAGINAL
1 series · 14 of 25 positions shown · non-contrast
Comparison: 01/19/2019

CLINICAL DATA: Pelvic pain for 1 month, LMP 08/09/2019

EXAM:
TRANSABDOMINAL AND TRANSVAGINAL ULTRASOUND OF PELVIS
TECHNIQUE: Both transabdominal and transvaginal ultrasound examinations of the
pelvis were performed. Transabdominal technique was performed for
global imaging of the pelvis including uterus, ovaries, adnexal
regions, and pelvic cul-de-sac. It was necessary to proceed with
endovaginal exam following the transabdominal exam to visualize the
endometrium and LEFT ovary.

[Series 1: us pelvis complete with transvaginal · 0.16mm/px · 14 of 91 slices shown]
[im 1/91]
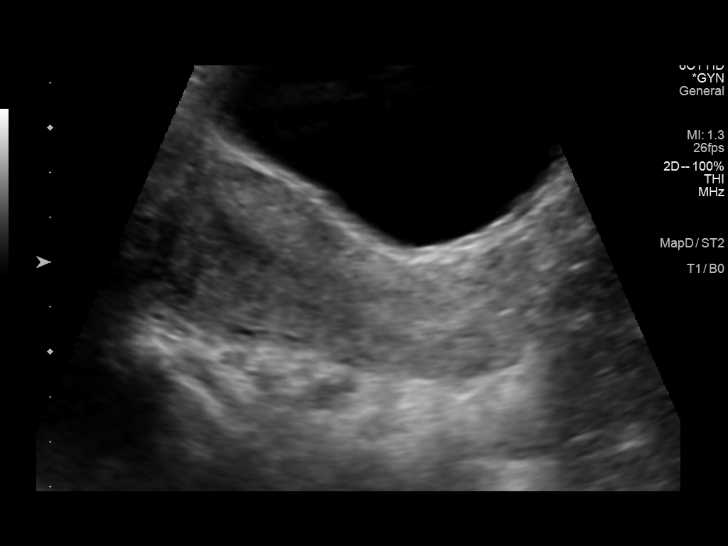
[im 8/91]
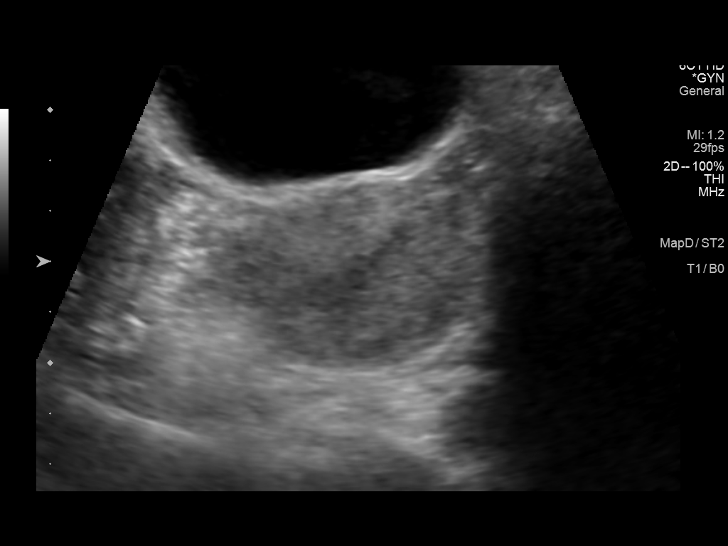
[im 16/91]
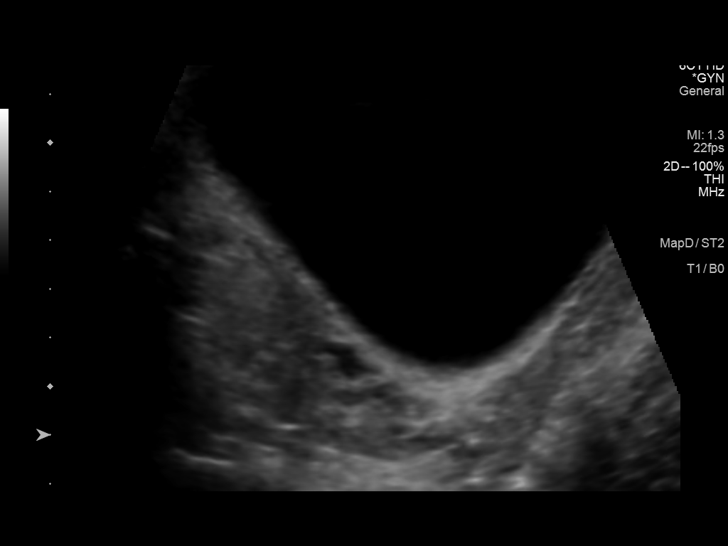
[im 23/91]
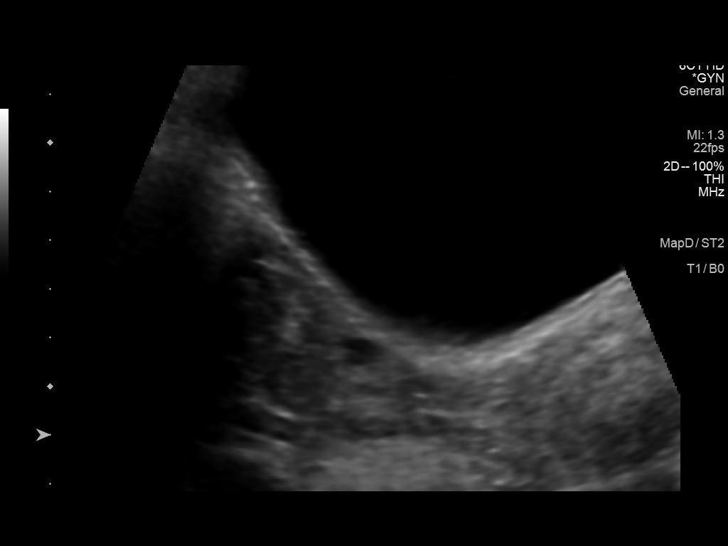
[im 31/91]
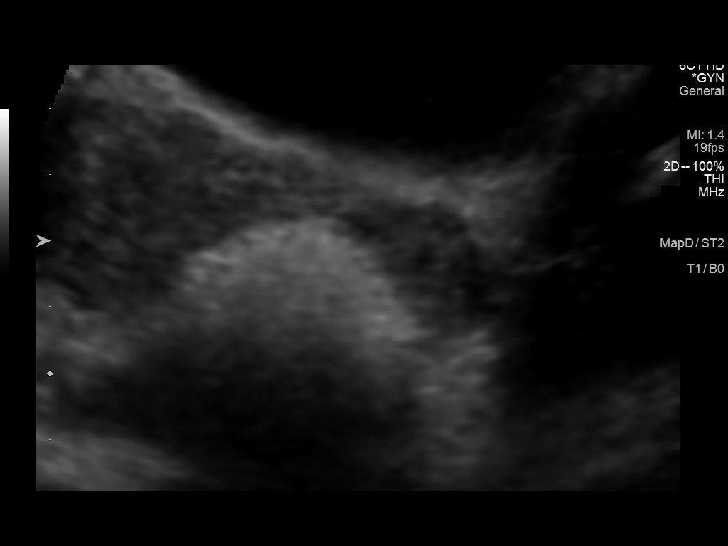
[im 34/91]
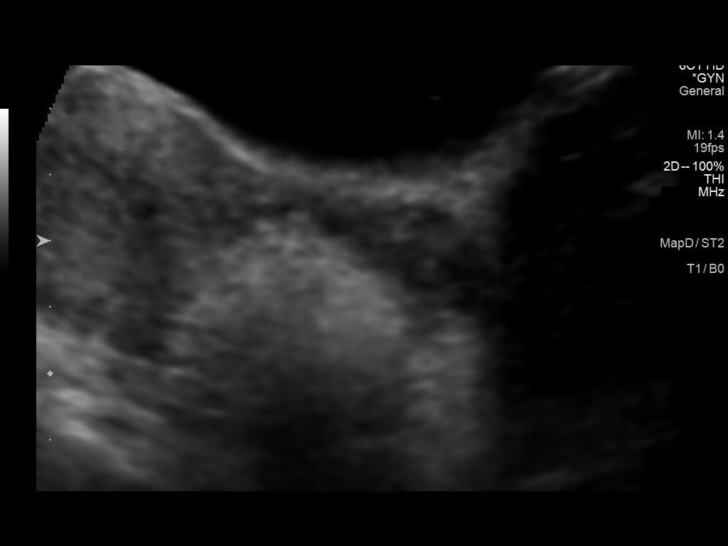
[im 42/91]
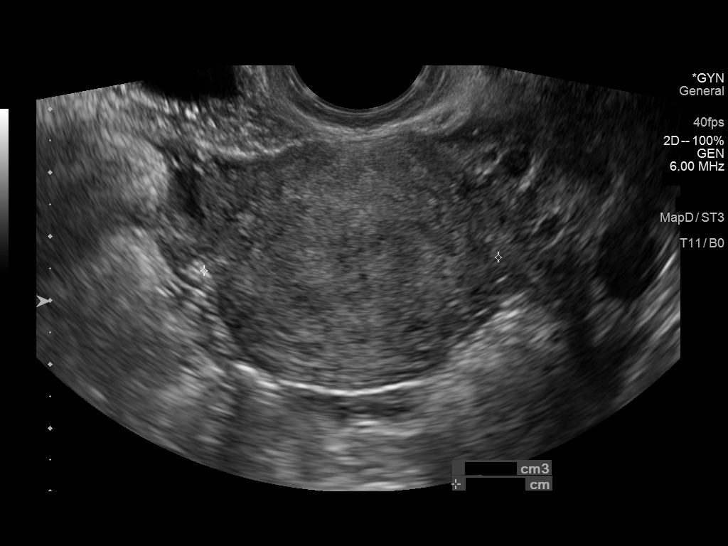
[im 49/91]
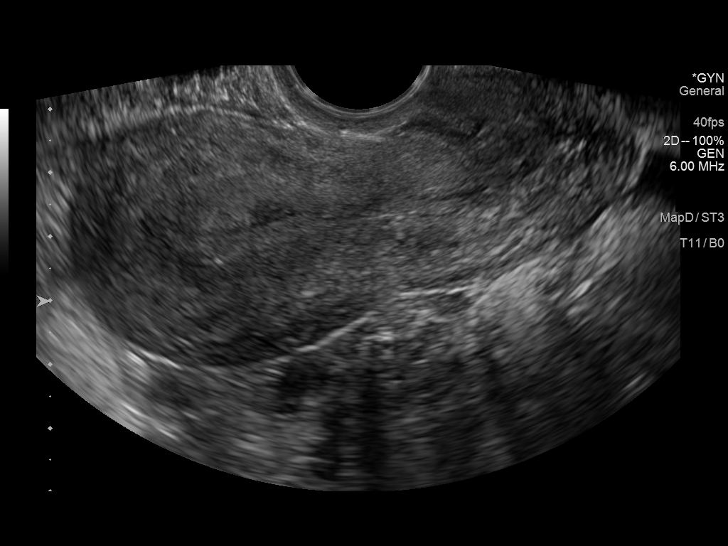
[im 57/91]
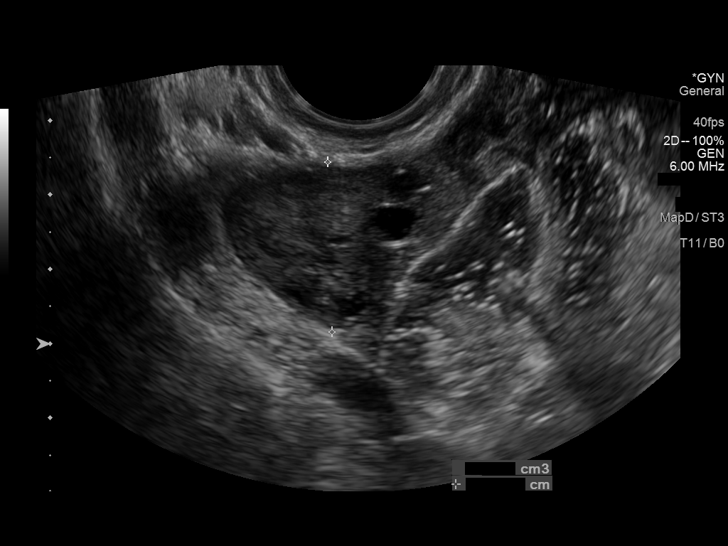
[im 61/91]
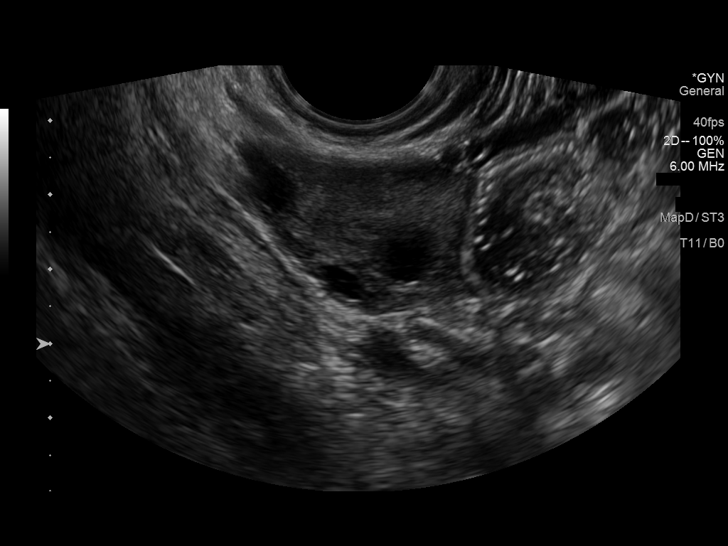
[im 68/91]
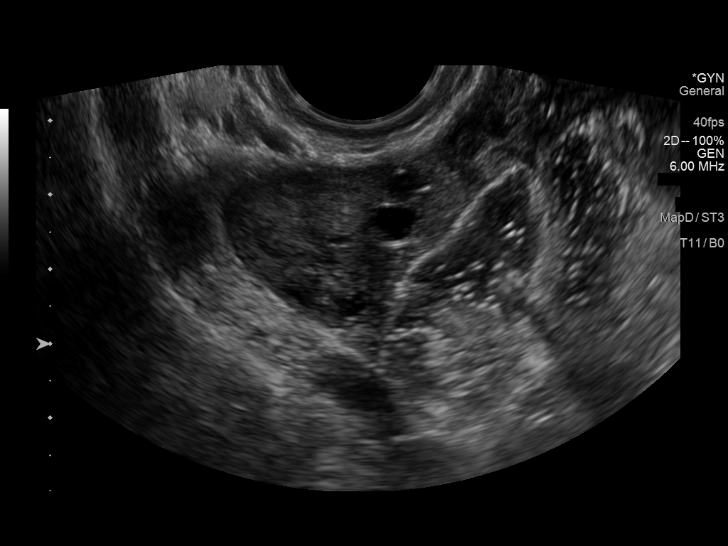
[im 76/91]
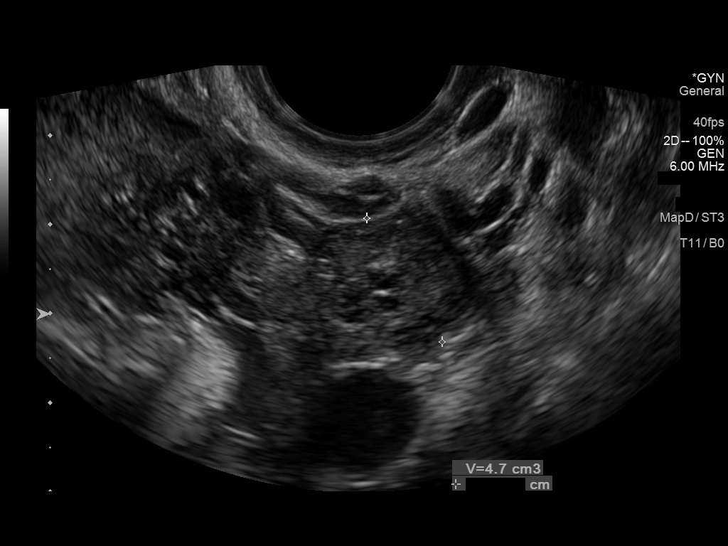
[im 83/91]
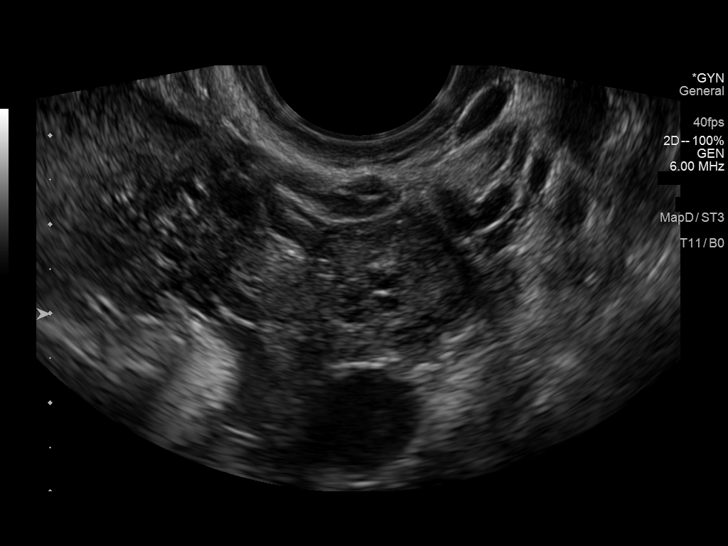
[im 91/91]
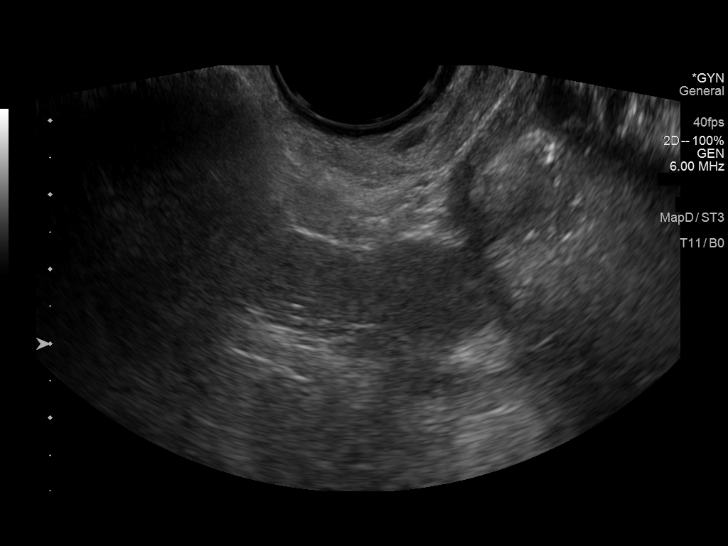

[14 of 25 positions shown; findings below may reference images not displayed]

FINDINGS: Uterus

Measurements: 7.9 x 3.5 x 4.4 cm = volume: 64 mL. Anteverted. Normal
morphology without mass. Incidentally noted few Nabothian cysts at
cervix.

Endometrium

Thickness: 8 mm.  No endometrial fluid or focal abnormality

Right ovary

Measurements: 4.5 x 2.0 x 2.3 cm = volume: 10.9 mL. Dominant
follicle without mass

Left ovary

Measurements: 3.6 x 1.5 x 1.6 cm = volume: 4.7 mL. Normal morphology
without mass

Other findings

No free pelvic fluid.  No adnexal masses.
IMPRESSION: Normal exam.

## 2021-12-24 LAB — CERVICOVAGINAL ANCILLARY ONLY
Bacterial Vaginitis (gardnerella): NEGATIVE
Candida Glabrata: NEGATIVE
Candida Vaginitis: NEGATIVE
Chlamydia: NEGATIVE
Comment: NEGATIVE
Comment: NEGATIVE
Comment: NEGATIVE
Comment: NEGATIVE
Comment: NEGATIVE
Comment: NORMAL
Neisseria Gonorrhea: NEGATIVE
Trichomonas: NEGATIVE

## 2022-01-02 ENCOUNTER — Telehealth: Payer: Medicaid Other | Admitting: Physician Assistant

## 2022-01-02 DIAGNOSIS — J029 Acute pharyngitis, unspecified: Secondary | ICD-10-CM | POA: Diagnosis not present

## 2022-01-02 NOTE — Progress Notes (Signed)
E-Visit for Sore Throat  We are sorry that you are not feeling well.  Here is how we plan to help!  Your symptoms indicate a likely viral infection (Pharyngitis).   Pharyngitis is inflammation in the back of the throat which can cause a sore throat, scratchiness and sometimes difficulty swallowing.   Pharyngitis is typically caused by a respiratory virus and will just run its course.  Please keep in mind that your symptoms could last up to 10 days.  For throat pain, we recommend over the counter oral pain relief medications such as acetaminophen or aspirin, or anti-inflammatory medications such as ibuprofen or naproxen sodium.  Topical treatments such as oral throat lozenges or sprays may be used as needed.  Avoid close contact with loved ones, especially the very young and elderly.  Remember to wash your hands thoroughly throughout the day as this is the number one way to prevent the spread of infection and wipe down door knobs and counters with disinfectant.  After careful review of your answers, I would not recommend an antibiotic for your condition.  Antibiotics should not be used to treat conditions that we suspect are caused by viruses like the virus that causes the common cold or flu. However, some people can have Strep with atypical symptoms. You may need formal testing in clinic or office to confirm if your symptoms continue or worsen.  Providers prescribe antibiotics to treat infections caused by bacteria. Antibiotics are very powerful in treating bacterial infections when they are used properly.  To maintain their effectiveness, they should be used only when necessary.  Overuse of antibiotics has resulted in the development of super bugs that are resistant to treatment!    Home Care: Only take medications as instructed by your medical team. Do not drink alcohol while taking these medications. A steam or ultrasonic humidifier can help congestion.  You can place a towel over your head and  breathe in the steam from hot water coming from a faucet. Avoid close contacts especially the very young and the elderly. Cover your mouth when you cough or sneeze. Always remember to wash your hands.  Get Help Right Away If: You develop worsening fever or throat pain. You develop a severe head ache or visual changes. Your symptoms persist after you have completed your treatment plan.  Make sure you Understand these instructions. Will watch your condition. Will get help right away if you are not doing well or get worse.   Thank you for choosing an e-visit.  Your e-visit answers were reviewed by a board certified advanced clinical practitioner to complete your personal care plan. Depending upon the condition, your plan could have included both over the counter or prescription medications.  Please review your pharmacy choice. Make sure the pharmacy is open so you can pick up prescription now. If there is a problem, you may contact your provider through MyChart messaging and have the prescription routed to another pharmacy.  Your safety is important to us. If you have drug allergies check your prescription carefully.   For the next 24 hours you can use MyChart to ask questions about today's visit, request a non-urgent call back, or ask for a work or school excuse. You will get an email in the next two days asking about your experience. I hope that your e-visit has been valuable and will speed your recovery.  I have spent 5 minutes in review of e-visit questionnaire, review and updating patient chart, medical decision making and response   to patient.   Ambar Raphael M Jahkai Yandell, PA-C  

## 2022-01-03 ENCOUNTER — Other Ambulatory Visit: Payer: Self-pay

## 2022-01-03 ENCOUNTER — Telehealth: Payer: Medicaid Other | Admitting: Physician Assistant

## 2022-01-03 ENCOUNTER — Emergency Department (HOSPITAL_BASED_OUTPATIENT_CLINIC_OR_DEPARTMENT_OTHER)
Admission: EM | Admit: 2022-01-03 | Discharge: 2022-01-03 | Disposition: A | Discharge status: Home / Self Care | Payer: Medicaid Other | Attending: Emergency Medicine | Admitting: Emergency Medicine

## 2022-01-03 ENCOUNTER — Encounter (HOSPITAL_BASED_OUTPATIENT_CLINIC_OR_DEPARTMENT_OTHER): Payer: Self-pay | Admitting: Urology

## 2022-01-03 ENCOUNTER — Ambulatory Visit
Admission: EM | Admit: 2022-01-03 | Discharge: 2022-01-03 | Disposition: A | Discharge status: Home / Self Care | Payer: Medicaid Other | Attending: Nurse Practitioner | Admitting: Nurse Practitioner

## 2022-01-03 DIAGNOSIS — Z1152 Encounter for screening for COVID-19: Secondary | ICD-10-CM | POA: Insufficient documentation

## 2022-01-03 DIAGNOSIS — H66002 Acute suppurative otitis media without spontaneous rupture of ear drum, left ear: Secondary | ICD-10-CM | POA: Diagnosis not present

## 2022-01-03 DIAGNOSIS — Z8616 Personal history of COVID-19: Secondary | ICD-10-CM | POA: Diagnosis not present

## 2022-01-03 DIAGNOSIS — B349 Viral infection, unspecified: Secondary | ICD-10-CM | POA: Diagnosis not present

## 2022-01-03 DIAGNOSIS — J069 Acute upper respiratory infection, unspecified: Secondary | ICD-10-CM

## 2022-01-03 DIAGNOSIS — J029 Acute pharyngitis, unspecified: Secondary | ICD-10-CM

## 2022-01-03 LAB — POCT RAPID STREP A (OFFICE): Rapid Strep A Screen: NEGATIVE

## 2022-01-03 LAB — RESP PANEL BY RT-PCR (FLU A&B, COVID) ARPGX2
Influenza A by PCR: NEGATIVE
Influenza A by PCR: NEGATIVE
Influenza B by PCR: NEGATIVE
Influenza B by PCR: NEGATIVE
SARS Coronavirus 2 by RT PCR: NEGATIVE
SARS Coronavirus 2 by RT PCR: NEGATIVE

## 2022-01-03 MED ORDER — KETOROLAC TROMETHAMINE 30 MG/ML IJ SOLN
30.0000 mg | Freq: Once | INTRAMUSCULAR | Status: AC
  Administered 2022-01-03: 30 mg via INTRAMUSCULAR
  Filled 2022-01-03: qty 1

## 2022-01-03 MED ORDER — FLUTICASONE PROPIONATE 50 MCG/ACT NA SUSP: 2.0000 | Freq: Every day | NASAL | 0 refills | Status: DC

## 2022-01-03 MED ORDER — ONDANSETRON 4 MG PO TBDP: 4.0000 mg | ORAL_TABLET | Freq: Three times a day (TID) | ORAL | 0 refills | Status: DC | PRN

## 2022-01-03 MED ORDER — AZITHROMYCIN 250 MG PO TABS: 250.0000 mg | ORAL_TABLET | Freq: Every day | ORAL | 0 refills | Status: DC

## 2022-01-03 MED ORDER — BENZONATATE 100 MG PO CAPS: 100.0000 mg | ORAL_CAPSULE | Freq: Three times a day (TID) | ORAL | 0 refills | Status: DC | PRN

## 2022-01-03 NOTE — ED Triage Notes (Signed)
Sore throat x 2 days, body aches, and fever  Was seen at UC earlier strep negative , no covid/flu done

## 2022-01-03 NOTE — ED Triage Notes (Signed)
Pt states sore throat for the past 2 days. Has been taking OTC medications with no relief.

## 2022-01-03 NOTE — Discharge Instructions (Signed)
Rest and fluids Continue over-the-counter ibuprofen, TheraFlu, etc. Follow-up with your PCP 2 to 3 days for recheck ER for any worsening symptoms I hope you feel better soon

## 2022-01-03 NOTE — Progress Notes (Signed)
I have spent 5 minutes in review of e-visit questionnaire, review and updating patient chart, medical decision making and response to patient.   Nola Botkins Cody Laloni Rowton, PA-C    

## 2022-01-03 NOTE — ED Provider Notes (Signed)
[facility]    CSN: [id number] Arrival date & time: 01/03/22  0841      History   Chief Complaint Chief Complaint  Patient presents with   Sore Throat    HPI Ashley [name] is a 26 y.o. female presents for evaluation of sore throat.  Patient reports 2 days of a sore throat, with body aches, chills, and fever.  Denies any cough, congestion, nausea/vomiting/diarrhea.  She does have a history of asthma, uses her Symbicort and has no wheezing or shortness of breath with symptoms.  No smoking history.  She is vaccinated for COVID and flu.  She has been taking OTC ibuprofen, TheraFlu, and decongestants for symptoms since onset.  No other concerns at this time.   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 Authorizing Provider  azithromycin (ZITHROMAX) 250 MG tablet Day 1: take 2 tablets. Day 2-5: Take 1 tablet daily. 11/21/21   Wallis Bamberg, PA-C  benzonatate (TESSALON) 100 MG capsule Take 1 capsule (100 mg total) by mouth 3 (three) times daily as needed for cough. 01/03/22   Waldon Merl, PA-C  cetirizine (ZYRTEC ALLERGY) 10 MG tablet Take 1 tablet (10 mg total) by mouth daily. 11/21/21   Wallis Bamberg, PA-C  ethynodiol-ethinyl estradiol (ZOVIA) 1-35 MG-MCG tablet Take 1 tablet by mouth daily.  08/16/21   Anyanwu, Jethro Bastos, MD  fluticasone (FLONASE) 50 MCG/ACT nasal spray Place 2 sprays into both nostrils daily. 01/03/22   Waldon Merl, PA-C  metroNIDAZOLE (FLAGYL) 500 MG tablet Take 1 tablet (500 mg total) by mouth 2 (two) times daily. 11/22/21   Constant, Peggy, MD  naproxen (NAPROSYN) 500 MG tablet Take 1 tablet (500 mg total) by mouth 2 (two) times daily with a meal. 11/21/21   Wallis Bamberg, PA-C  pseudoephedrine (SUDAFED) 60 MG tablet Take 1 tablet (60 mg total) by mouth every 6 (six) hours as needed for congestion. 11/21/21   Wallis Bamberg, PA-C  SYMBICORT 160-4.5 MCG/ACT inhaler SMARTSIG:2 Puff(s) By Mouth Twice Daily 07/17/21   [provider]  valACYclovir (VALTREX) 500 MG tablet TAKE AS DIRECTED FOR 3 DAYS AS NEEDED FOR EACH OUTBREAK 07/13/21   Brock Bad, MD  VENTOLIN HFA 108 [phone] Base) MCG/ACT inhaler SMARTSIG:1 Puff(s) By Mouth Every 4-6 Hours PRN 06/11/21   [provider]    Family History Family History  Problem Relation Age of Onset   Diabetes Mother    Hypertension Mother    Diabetes Sister    Hypertension Sister  Social History Social History   Tobacco Use   Smoking status: Never   Smokeless tobacco: Never  Vaping Use   Vaping Use: Never used  Substance Use Topics   Alcohol use: Not Currently    Comment: occ   Drug use: No     Allergies   Penicillins   Review of Systems Review of Systems  Constitutional:  Positive for fever.  HENT:  Positive for sore throat.   Musculoskeletal:  Positive for myalgias.     Physical Exam Triage Vital Signs ED Triage Vitals  Enc Vitals Group     BP 01/03/22 0854 119/83     Pulse Rate 01/03/22 0854 76     Resp 01/03/22 0854 16     Temp 01/03/22 0854 99 F (37.2 C)     Temp Source 01/03/22 0854 Oral     SpO2 01/03/22 0854 97 %     Weight --      Height --      Head Circumference --      Peak Flow --      Pain Score 01/03/22 0855 10     Pain Loc --      Pain Edu? --       Excl. in [place]? --    No data found.  Updated Vital Signs BP 119/83 (BP Location: Right Arm)   Pulse 76   Temp 99 F (37.2 C) (Oral)   Resp 16   LMP 12/23/2021 (Exact Date)   SpO2 97%   Visual Acuity Right Eye Distance:   Left Eye Distance:   Bilateral Distance:    Right Eye Near:   Left Eye Near:    Bilateral Near:     Physical Exam Vitals and nursing note reviewed.  Constitutional:      General: She is not in acute distress.    Appearance: She is well-developed. She is not ill-appearing.  HENT:     Head: Normocephalic and atraumatic.     Right Ear: Tympanic membrane and ear canal normal.     Left Ear: Tympanic membrane and ear canal normal.     Nose: Congestion present.     Mouth/Throat:     Mouth: Mucous membranes are moist.     Pharynx: Oropharynx is clear. Uvula midline. Posterior oropharyngeal erythema present.     Tonsils: No tonsillar exudate or tonsillar abscesses.  Eyes:     Conjunctiva/sclera: Conjunctivae normal.     Pupils: Pupils are equal, round, and reactive to light.  Cardiovascular:     Rate and Rhythm: Normal rate and regular rhythm.     Heart sounds: Normal heart sounds.  Pulmonary:     Effort: Pulmonary effort is normal.     Breath sounds: Normal breath sounds.  Musculoskeletal:     Cervical back: Normal range of motion and neck supple.  Lymphadenopathy:     Cervical: No cervical adenopathy.  Skin:    General: Skin is warm and dry.  Neurological:     General: No focal deficit present.     Mental Status: She is alert and oriented to person, place, and time.  Psychiatric:        Mood and Affect: Mood normal.        Behavior: Behavior normal.      UC Treatments / Results  Labs (all labs ordered are listed, but only abnormal results are displayed) Labs Reviewed  CULTURE, GROUP A STREP ([place])  RESP PANEL BY RT-PCR (FLU A&B, COVID) [id number]  POCT  RAPID STREP A (OFFICE)    EKG   Radiology No results found.  Procedures Procedures  (including critical care time)  Medications Ordered in UC Medications - No data to display  Initial Impression / Assessment and Plan / UC Course  I have reviewed the triage vital signs and the nursing notes.  Pertinent labs & imaging results that were available during my care of the patient were reviewed by me and considered in my medical decision making (see chart for details).     Discussed with patient viral illness and symptomatic treatment  Throat culture  COVID/flu PCR Continue OTC medications as needed for symptoms  Rest and fluids Follow up with PCP in 2-3 days for re-check  ER precautions reviewed and pt verbalized understanding  Final Clinical Impressions(s) / UC Diagnoses   Final diagnoses:  Sore throat  Viral illness     Discharge Instructions      Rest and fluids Continue over-the-counter ibuprofen, TheraFlu, etc. Follow-up with your PCP 2 to 3 days for recheck ER for any worsening symptoms I hope you feel better soon    ED Prescriptions   None    PDMP not reviewed this encounter.   Radford Pax, NP 01/03/22 [phone]

## 2022-01-03 NOTE — Progress Notes (Signed)
E-Visit for Upper Respiratory Infection   We are sorry you are not feeling well.  Here is how we plan to help!  Based on what you have shared with me, it looks like you may have a viral upper respiratory infection.  Upper respiratory infections are caused by a large number of viruses; however, rhinovirus is the most common cause.   Symptoms vary from person to person, with common symptoms including sore throat, cough, fatigue or lack of energy and feeling of general discomfort.  A low-grade fever of up to 100.4 may present, but is often uncommon.  Symptoms vary however, and are closely related to a person's age or underlying illnesses.  The most common symptoms associated with an upper respiratory infection are nasal discharge or congestion, cough, sneezing, headache and pressure in the ears and face.  These symptoms usually persist for about 3 to 10 days, but can last up to 2 weeks.  It is important to know that upper respiratory infections do not cause serious illness or complications in most cases.    Upper respiratory infections can be transmitted from person to person, with the most common method of transmission being a person's hands.  The virus is able to live on the skin and can infect other persons for up to 2 hours after direct contact.  Also, these can be transmitted when someone coughs or sneezes; thus, it is important to cover the mouth to reduce this risk.  To keep the spread of the illness at bay, good hand hygiene is very important.  This is an infection that is most likely caused by a virus. There are no specific treatments other than to help you with the symptoms until the infection runs its course.  We are sorry you are not feeling well.  Here is how we plan to help!   For nasal congestion, you may use an oral decongestants such as Mucinex D or if you have glaucoma or high blood pressure use plain Mucinex.  Saline nasal spray or nasal drops can help and can safely be used as often as  needed for congestion.  For your congestion, I have prescribed Fluticasone nasal spray one spray in each nostril twice a day  If you do not have a history of heart disease, hypertension, diabetes or thyroid disease, prostate/bladder issues or glaucoma, you may also use Sudafed to treat nasal congestion.  It is highly recommended that you consult with a pharmacist or your primary care physician to ensure this medication is safe for you to take.     If you have a cough, you may use cough suppressants such as Delsym and Robitussin.  If you have glaucoma or high blood pressure, you can also use Coricidin HBP.   For cough I have prescribed for you A prescription cough medication called Tessalon Perles 100 mg. You may take 1-2 capsules every 8 hours as needed for cough  If you have a sore or scratchy throat, use a saltwater gargle-  to  teaspoon of salt dissolved in a 4-ounce to 8-ounce glass of warm water.  Gargle the solution for approximately 15-30 seconds and then spit.  It is important not to swallow the solution.  You can also use throat lozenges/cough drops and Chloraseptic spray to help with throat pain or discomfort.  Warm or cold liquids can also be helpful in relieving throat pain. Strep throat is usually accompanied by a higher fever and no respiratory symptoms (nasal/chest congestion, drainage, cough, etc), and tonsillar  swelling with white patches. As you do not meet this criteria and no mentioned exposure to strep, it is unlikely that is the cause of your symptoms.   For headache, pain or general discomfort, you can use Ibuprofen or Tylenol as directed.   Some authorities believe that zinc sprays or the use of Echinacea may shorten the course of your symptoms.   HOME CARE Only take medications as instructed by your medical team. Be sure to drink plenty of fluids. Water is fine as well as fruit juices, sodas and electrolyte beverages. You may want to stay away from caffeine or alcohol. If  you are nauseated, try taking small sips of liquids. How do you know if you are getting enough fluid? Your urine should be a pale yellow or almost colorless. Get rest. Taking a steamy shower or using a humidifier may help nasal congestion and ease sore throat pain. You can place a towel over your head and breathe in the steam from hot water coming from a faucet. Using a saline nasal spray works much the same way. Cough drops, hard candies and sore throat lozenges may ease your cough. Avoid close contacts especially the very young and the elderly Cover your mouth if you cough or sneeze Always remember to wash your hands.   GET HELP RIGHT AWAY IF: You develop worsening fever. If your symptoms do not improve within 10 days You develop yellow or green discharge from your nose over 3 days. You have coughing fits You develop a severe head ache or visual changes. You develop shortness of breath, difficulty breathing or start having chest pain Your symptoms persist after you have completed your treatment plan  MAKE SURE YOU  Understand these instructions. Will watch your condition. Will get help right away if you are not doing well or get worse.  Thank you for choosing an e-visit.  Your e-visit answers were reviewed by a board certified advanced clinical practitioner to complete your personal care plan. Depending upon the condition, your plan could have included both over the counter or prescription medications.  Please review your pharmacy choice. Make sure the pharmacy is open so you can pick up prescription now. If there is a problem, you may contact your provider through [employer] and have the prescription routed to another pharmacy.  Your safety is important to [place]. If you have drug allergies check your prescription carefully.   For the next 24 hours you can use MyChart to ask questions about today's visit, request a non-urgent call back, or ask for a work or school excuse. You will  get an email in the next two days asking about your experience. I hope that your e-visit has been valuable and will speed your recovery.

## 2022-01-03 NOTE — Discharge Instructions (Signed)
Please read and follow all provided instructions.  Your diagnoses today include:  1. Viral URI with cough   2. Non-recurrent acute suppurative otitis media of left ear without spontaneous rupture of tympanic membrane    Tests performed today include: Vital signs. See below for your results today.  Flu and COVID testing: Was negative  Medications prescribed:  Azithromycin - antibiotic for respiratory infection  You have been prescribed an antibiotic medicine: take the entire course of medicine even if you are feeling better. Stopping early can cause the antibiotic not to work.  Please use over-the-counter NSAID medications (ibuprofen, naproxen) or Tylenol (acetaminophen) as directed on the packaging for pain -- as long as you do not have any reasons avoid these medications. Reasons to avoid NSAID medications include: weak kidneys, a history of bleeding in your stomach or gut, or uncontrolled high blood pressure or previous heart attack. Reasons to avoid Tylenol include: liver problems or ongoing alcohol use. Never take more than 4000mg  or 8 Extra strength Tylenol in a 24 hour period.     Take any prescribed medications only as directed. Treatment for your infection is aimed at treating the symptoms. There are no medications, such as antibiotics, that will cure your infection.   Home care instructions:  You can take Tylenol and/or Ibuprofen as directed on the packaging for fever reduction and pain relief.    For cough: honey 1/2 to 1 teaspoon (you can dilute the honey in water or another fluid).  You can also use guaifenesin and dextromethorphan for cough. You can use a humidifier for chest congestion and cough.  If you don't have a humidifier, you can sit in the bathroom with the hot shower running.      For sore throat: try warm salt water gargles, cepacol lozenges, throat spray, warm tea or water with lemon/honey, popsicles or ice, or OTC cold relief medicine for throat discomfort.    For  congestion: take a daily anti-histamine like Zyrtec, Claritin, and a oral decongestant, such as pseudoephedrine.  You can also use Flonase 1-2 sprays in each nostril daily.    It is important to stay hydrated: drink plenty of fluids (water, gatorade/powerade/pedialyte, juices, or teas) to keep your throat moisturized and help further relieve irritation/discomfort.   Your illness is contagious and can be spread to others, especially during the first 3 or 4 days. It cannot be cured by antibiotics or other medicines. Take basic precautions such as washing your hands often, covering your mouth when you cough or sneeze, and avoiding public places where you could spread your illness to others.   Please continue drinking plenty of fluids.  Use over-the-counter medicines as needed as directed on packaging for symptom relief.  You may also use ibuprofen or tylenol as directed on packaging for pain or fever.  Do not take multiple medicines containing Tylenol or acetaminophen to avoid taking too much of this medication.  Follow-up instructions: Please follow-up with your primary care provider in the next 3 days for further evaluation of your symptoms if you are not feeling better.   Return instructions:  Please return to the Emergency Department if you experience worsening symptoms.  RETURN IMMEDIATELY IF you develop shortness of breath, confusion or altered mental status, a new rash, become dizzy, faint, or poorly responsive, or are unable to be cared for at home. Please return if you have persistent vomiting and cannot keep down fluids or develop a fever that is not controlled by tylenol or motrin.  Please return if you have any other emergent concerns.  Additional Information:  Your vital signs today were: BP 118/73 (BP Location: Left Arm)   Pulse (!) 101   Temp 99.8 F (37.7 C) (Oral)   Resp 17   Ht 5\' 8"  (1.727 m)   Wt 68 kg   LMP 12/23/2021 (Exact Date)   SpO2 99%   BMI 22.81 kg/m  If your  blood pressure (BP) was elevated above 135/85 this visit, please have this repeated by your doctor within one month. --------------

## 2022-01-03 NOTE — ED Provider Notes (Signed)
[facility] EMERGENCY DEPARTMENT Provider Note   CSN: [id number] Arrival date & time: 01/03/22  1744     History  Chief Complaint  Patient presents with   Sore Throat    Ashley [name] is a 26 y.o. female.  Patient presents emergency department for evaluation of sore throat, body aches and fever.  Her symptoms started 2 days ago.  Patient was seen at urgent care earlier today and had a strep throat test which was negative.  No nausea, vomiting, or diarrhea.  She feels generally unwell.  She also has left ear pain which worsened over the course of the day today.  She denies urinary symptoms.  No known sick contacts.       Home Medications Prior to Admission medications   Medication Sig Start Date End Date Taking? Authorizing Provider  azithromycin (ZITHROMAX) 250 MG tablet Day 1: take 2 tablets. Day 2-5: Take 1 tablet daily. 11/21/21   Wallis Bamberg, PA-C  benzonatate (TESSALON) 100 MG capsule Take 1 capsule (100 mg total) by mouth 3 (three) times daily as needed for cough. 01/03/22   Waldon Merl, PA-C  cetirizine (ZYRTEC ALLERGY) 10 MG tablet Take 1 tablet (10 mg total) by mouth daily. 11/21/21   Wallis Bamberg, PA-C  ethynodiol-ethinyl estradiol (ZOVIA) 1-35 MG-MCG tablet Take 1 tablet by mouth daily. 08/16/21   Anyanwu, Jethro Bastos, MD  fluticasone (FLONASE) 50 MCG/ACT nasal spray Place 2 sprays into both nostrils daily. 01/03/22   Waldon Merl, PA-C  metroNIDAZOLE (FLAGYL) 500 MG tablet Take 1 tablet (500 mg total) by mouth 2 (two) times daily. 11/22/21   Constant, Peggy, MD  naproxen (NAPROSYN) 500 MG tablet Take 1 tablet (500 mg total) by mouth 2 (two) times daily with a meal. 11/21/21   Wallis Bamberg, PA-C  pseudoephedrine (SUDAFED) 60 MG tablet Take 1 tablet (60 mg total) by mouth every 6 (six) hours as needed for congestion. 11/21/21   Wallis Bamberg, PA-C  SYMBICORT 160-4.5 MCG/ACT inhaler SMARTSIG:2 Puff(s) By Mouth Twice Daily 07/17/21   [provider]   valACYclovir (VALTREX) 500 MG tablet TAKE AS DIRECTED FOR 3 DAYS AS NEEDED FOR EACH OUTBREAK 07/13/21   Brock Bad, MD  VENTOLIN HFA 108 [phone] Base) MCG/ACT inhaler SMARTSIG:1 Puff(s) By Mouth Every 4-6 Hours PRN 06/11/21   [provider]      Allergies    Penicillins    Review of Systems   Review of Systems  Physical Exam Updated Vital Signs BP 118/73 (BP Location: Left Arm)   Pulse (!) 101   Temp 99.8 F (37.7 C) (Oral)   Resp 17   Ht 5\' 8"  (1.727 m)   Wt 68 kg   LMP 12/23/2021 (Exact Date)   SpO2 99%   BMI 22.81 kg/m  Physical Exam Vitals and nursing note reviewed.  Constitutional:      General: She is not in acute distress.    Appearance: She is well-developed.  HENT:     Head: Normocephalic and atraumatic.     Right Ear: Tympanic membrane, ear canal and external ear normal. Tympanic membrane is not erythematous or bulging.     Left Ear: Ear canal and external ear normal. Tympanic membrane is erythematous and bulging.     Nose: Nose normal.     Mouth/Throat:     Mouth: Mucous membranes are moist.  Eyes:     Conjunctiva/sclera: Conjunctivae normal.  Cardiovascular:     Rate and Rhythm: Normal rate and regular rhythm.  Heart sounds: No murmur heard. Pulmonary:     Effort: No respiratory distress.     Breath sounds: No wheezing, rhonchi or rales.  Abdominal:     Palpations: Abdomen is soft.     Tenderness: There is no abdominal tenderness. There is no guarding or rebound.  Musculoskeletal:     Cervical back: Normal range of motion and neck supple.     Right lower leg: No edema.     Left lower leg: No edema.  Skin:    General: Skin is warm and dry.     Findings: No rash.  Neurological:     General: No focal deficit present.     Mental Status: She is alert. Mental status is at baseline.     Motor: No weakness.  Psychiatric:        Mood and Affect: Mood normal.     ED Results / Procedures / Treatments   Labs (all labs ordered are listed,  but only abnormal results are displayed) Labs Reviewed  RESP PANEL BY RT-PCR (FLU A&B, COVID) ARPGX2    EKG None  Radiology No results found.  Procedures Procedures    Medications Ordered in ED Medications  ketorolac (TORADOL) 30 MG/ML injection 30 mg (has no administration in time range)    ED Course/ Medical Decision Making/ A&P    Patient seen and examined. History obtained directly from patient. Work-up including labs, imaging, EKG ordered in triage, if performed, were reviewed.    Labs/EKG: Awaiting results of flu, COVID.  Imaging: None ordered  Medications/Fluids: Ordered: IM Toradol.  Most recent vital signs reviewed and are as follows: BP 118/73 (BP Location: Left Arm)   Pulse (!) 101   Temp 99.8 F (37.7 C) (Oral)   Resp 17   Ht 5\' 8"  (1.727 m)   Wt 68 kg   LMP 12/23/2021 (Exact Date)   SpO2 99%   BMI 22.81 kg/m   Initial impression: Otitis media on the left, fever/flulike symptoms  7:36 PM Reassessment performed. Patient appears stable.  Labs personally reviewed and interpreted including: Flu and COVID are negative.  Reviewed pertinent lab work and imaging with patient at bedside. Questions answered.   Plan: Discharge to home.   Prescriptions written for: Azithromycin for ear infection due to penicillin allergy.  Other home care instructions discussed: Rest, hydration, OTC meds  ED return instructions discussed: Persistent vomiting, severe headache, trouble breathing, other concerns.  Follow-up instructions discussed: Patient encouraged to follow-up with their PCP in 5 days.                            Medical Decision Making Risk Prescription drug management.   Patient with symptoms consistent with a viral syndrome.  However, she does have clinical signs of a left otitis media on exam.  COVID, flu, and strep were negative today.  Vitals are stable.  Appears fairly well-hydrated and is not vomiting..  Lung exam normal, no signs of  pneumonia. Supportive therapy indicated with return if symptoms worsen.          Final Clinical Impression(s) / ED Diagnoses Final diagnoses:  Viral URI with cough  Non-recurrent acute suppurative otitis media of left ear without spontaneous rupture of tympanic membrane    Rx / DC Orders ED Discharge Orders          Ordered    azithromycin (ZITHROMAX) 250 MG tablet  Daily        01/03/22  [address]              Renne Crigler, PA-C 01/03/22 1940    Maia Plan, MD 01/04/22 1011

## 2022-01-06 LAB — CULTURE, GROUP A STREP (THRC)

## 2022-03-21 ENCOUNTER — Ambulatory Visit: Payer: Self-pay

## 2022-03-23 ENCOUNTER — Telehealth: Payer: Self-pay | Admitting: Urgent Care

## 2022-03-23 DIAGNOSIS — N898 Other specified noninflammatory disorders of vagina: Secondary | ICD-10-CM

## 2022-03-23 MED ORDER — FLUCONAZOLE 150 MG PO TABS: 150.0000 mg | ORAL_TABLET | Freq: Once | ORAL | 0 refills | Status: AC

## 2022-03-23 NOTE — Progress Notes (Signed)

## 2022-04-10 ENCOUNTER — Telehealth: Payer: Self-pay | Admitting: Physician Assistant

## 2022-04-10 DIAGNOSIS — J069 Acute upper respiratory infection, unspecified: Secondary | ICD-10-CM

## 2022-04-10 MED ORDER — BENZONATATE 100 MG PO CAPS: 100.0000 mg | ORAL_CAPSULE | Freq: Three times a day (TID) | ORAL | 0 refills | Status: DC | PRN

## 2022-04-10 MED ORDER — AZELASTINE HCL 0.1 % NA SOLN: 1.0000 | Freq: Two times a day (BID) | NASAL | 0 refills | Status: DC

## 2022-04-10 NOTE — Progress Notes (Signed)
E-Visit for Upper Respiratory Infection   We are sorry you are not feeling well.  Here is how we plan to help!  Based on what you have shared with me, it looks like you may have a viral upper respiratory infection.  Upper respiratory infections are caused by a large number of viruses; however, rhinovirus is the most common cause.   Symptoms vary from person to person, with common symptoms including sore throat, cough, fatigue or lack of energy and feeling of general discomfort.  A low-grade fever of up to 100.4 may present, but is often uncommon.  Symptoms vary however, and are closely related to a person's age or underlying illnesses.  The most common symptoms associated with an upper respiratory infection are nasal discharge or congestion, cough, sneezing, headache and pressure in the ears and face.  These symptoms usually persist for about 3 to 10 days, but can last up to 2 weeks.  It is important to know that upper respiratory infections do not cause serious illness or complications in most cases.    Upper respiratory infections can be transmitted from person to person, with the most common method of transmission being a person's hands.  The virus is able to live on the skin and can infect other persons for up to 2 hours after direct contact.  Also, these can be transmitted when someone coughs or sneezes; thus, it is important to cover the mouth to reduce this risk.  To keep the spread of the illness at [place], good hand hygiene is very important.  This is an infection that is most likely caused by a virus. There are no specific treatments other than to help you with the symptoms until the infection runs its course.  We are sorry you are not feeling well.  Here is how we plan to help!   For nasal congestion, you may use an oral decongestants such as Mucinex D or if you have glaucoma or high blood pressure use plain Mucinex.  Saline nasal spray or nasal drops can help and can safely be used as often as  needed for congestion.  For your congestion, I have prescribed Azelastine nasal spray two sprays in each nostril twice a day  If you do not have a history of heart disease, hypertension, diabetes or thyroid disease, prostate/bladder issues or glaucoma, you may also use Sudafed to treat nasal congestion.  It is highly recommended that you consult with a pharmacist or your primary care physician to ensure this medication is safe for you to take.     If you have a cough, you may use cough suppressants such as Delsym and Robitussin.  If you have glaucoma or high blood pressure, you can also use Coricidin HBP.   For cough I have prescribed for you A prescription cough medication called Tessalon Perles 100 mg. You may take 1-2 capsules every 8 hours as needed for cough  If you have a sore or scratchy throat, use a saltwater gargle-  to  teaspoon of salt dissolved in a 4-ounce to 8-ounce glass of warm water.  Gargle the solution for approximately 15-30 seconds and then spit.  It is important not to swallow the solution.  You can also use throat lozenges/cough drops and Chloraseptic spray to help with throat pain or discomfort.  Warm or cold liquids can also be helpful in relieving throat pain.  For headache, pain or general discomfort, you can use Ibuprofen or Tylenol as directed.   Some authorities believe  that zinc sprays or the use of Echinacea may shorten the course of your symptoms.   HOME CARE Only take medications as instructed by your medical team. Be sure to drink plenty of fluids. Water is fine as well as fruit juices, sodas and electrolyte beverages. You may want to stay away from caffeine or alcohol. If you are nauseated, try taking small sips of liquids. How do you know if you are getting enough fluid? Your urine should be a pale yellow or almost colorless. Get rest. Taking a steamy shower or using a humidifier may help nasal congestion and ease sore throat pain. You can place a towel over  your head and breathe in the steam from hot water coming from a faucet. Using a saline nasal spray works much the same way. Cough drops, hard candies and sore throat lozenges may ease your cough. Avoid close contacts especially the very young and the elderly Cover your mouth if you cough or sneeze Always remember to wash your hands.   GET HELP RIGHT AWAY IF: You develop worsening fever. If your symptoms do not improve within 10 days You develop yellow or green discharge from your nose over 3 days. You have coughing fits You develop a severe head ache or visual changes. You develop shortness of breath, difficulty breathing or start having chest pain Your symptoms persist after you have completed your treatment plan  MAKE SURE YOU  Understand these instructions. Will watch your condition. Will get help right away if you are not doing well or get worse.  Thank you for choosing an e-visit.  Your e-visit answers were reviewed by a board certified advanced clinical practitioner to complete your personal care plan. Depending upon the condition, your plan could have included both over the counter or prescription medications.  Please review your pharmacy choice. Make sure the pharmacy is open so you can pick up prescription now. If there is a problem, you may contact your provider through [employer] and have the prescription routed to another pharmacy.  Your safety is important to [place]. If you have drug allergies check your prescription carefully.   For the next 24 hours you can use MyChart to ask questions about today's visit, request a non-urgent call back, or ask for a work or school excuse. You will get an email in the next two days asking about your experience. I hope that your e-visit has been valuable and will speed your recovery.  I have spent 5 minutes in review of e-visit questionnaire, review and updating patient chart, medical decision making and response to patient.    Mar Daring, PA-C

## 2022-05-07 ENCOUNTER — Ambulatory Visit: Payer: Managed Care, Other (non HMO) | Admitting: Obstetrics

## 2022-05-20 ENCOUNTER — Ambulatory Visit
Admission: EM | Admit: 2022-05-20 | Discharge: 2022-05-20 | Disposition: A | Discharge status: Home / Self Care | Payer: Managed Care, Other (non HMO) | Attending: Nurse Practitioner | Admitting: Nurse Practitioner

## 2022-05-20 DIAGNOSIS — L989 Disorder of the skin and subcutaneous tissue, unspecified: Secondary | ICD-10-CM

## 2022-05-20 DIAGNOSIS — Z113 Encounter for screening for infections with a predominantly sexual mode of transmission: Secondary | ICD-10-CM | POA: Diagnosis present

## 2022-05-20 DIAGNOSIS — R599 Enlarged lymph nodes, unspecified: Secondary | ICD-10-CM | POA: Diagnosis present

## 2022-05-20 DIAGNOSIS — N760 Acute vaginitis: Secondary | ICD-10-CM

## 2022-05-20 LAB — POCT URINE PREGNANCY: Preg Test, Ur: NEGATIVE

## 2022-05-20 LAB — POCT URINALYSIS DIP (MANUAL ENTRY)
Bilirubin, UA: NEGATIVE
Blood, UA: NEGATIVE
Glucose, UA: NEGATIVE mg/dL
Ketones, POC UA: NEGATIVE mg/dL
Leukocytes, UA: NEGATIVE
Nitrite, UA: NEGATIVE
Protein Ur, POC: NEGATIVE mg/dL
Spec Grav, UA: 1.025 (ref 1.010–1.025)
Urobilinogen, UA: 0.2 E.U./dL
pH, UA: 6 (ref 5.0–8.0)

## 2022-05-20 MED ORDER — SULFAMETHOXAZOLE-TRIMETHOPRIM 800-160 MG PO TABS: 1.0000 | ORAL_TABLET | Freq: Two times a day (BID) | ORAL | 0 refills | Status: DC

## 2022-05-20 MED ORDER — FLUCONAZOLE 150 MG PO TABS: 150.0000 mg | ORAL_TABLET | Freq: Every day | ORAL | 0 refills | Status: AC

## 2022-05-20 NOTE — Discharge Instructions (Addendum)
The clinic will contact you with results of the testing done today if positive Diflucan as prescribed Start Bactrim to cover for possible skin infection Please follow-up with your PCP if your symptoms do not improve Please go to the emergency room for any worsening symptoms

## 2022-05-20 NOTE — ED Triage Notes (Addendum)
Pt c/o 3 raised area to rt buttocks with itching, states her husband has 1 raised on his upper thigh. Pt requesting STD testing. C/o vaginal itchy x2 days. States took AZO with no relief.  Pt c/o a knot to rt lower abdomen/upper thigh area x3 days.

## 2022-05-20 NOTE — ED Provider Notes (Signed)
[facility]    CSN: [id number] Arrival date & time: 05/20/22  0847      History   Chief Complaint Chief Complaint  Patient presents with   Rash   SEXUALLY TRANSMITTED DISEASE    HPI Ashley [name] is a 27 y.o. female presents for evaluation of vaginal itching.  Patient reports 2 days of vaginal itching without discharge.  Denies dysuria, fevers, chills, nausea/vomiting, flank pain.  No known STD exposure but would like screening.  In addition she reports she has 3 sores on her right buttock that have been there for 3 days.  States they itch but denies any swelling, drainage, warmth, pain.  No history of MRSA.  States her husband had a similar sore that was treated with antibiotics that helped some.  She states they do not know what it was.  In addition she reports an enlarged lymph node in her right groin.  She did take an over-the-counter Azo yeast infection medication without improvement.  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 Authorizing Provider  fluconazole (DIFLUCAN) 150 MG tablet Take 1 tablet (150 mg total) by mouth daily for 2 doses. Take 1 dose today and may repeat in 3 days if symptoms persist 05/20/22 05/22/22 Yes Radford Pax, NP   sulfamethoxazole-trimethoprim (BACTRIM DS) 800-160 MG tablet Take 1 tablet by mouth 2 (two) times daily for 7 days. 05/20/22 05/27/22 Yes Radford Pax, NP  azelastine (ASTELIN) 0.1 % nasal spray Place 1 spray into both nostrils 2 (two) times daily. Use in each nostril as directed 04/10/22   Margaretann Loveless, PA-C  cetirizine (ZYRTEC ALLERGY) 10 MG tablet Take 1 tablet (10 mg total) by mouth daily. 11/21/21   Wallis Bamberg, PA-C  ethynodiol-ethinyl estradiol (ZOVIA) 1-35 MG-MCG tablet Take 1 tablet by mouth daily. 08/16/21   Anyanwu, Jethro Bastos, MD  fluticasone (FLONASE) 50 MCG/ACT nasal spray Place 2 sprays into both nostrils daily. 01/03/22   Waldon Merl, PA-C  naproxen (NAPROSYN) 500 MG tablet Take 1 tablet (500 mg total) by mouth 2 (two) times daily with a meal. 11/21/21   Wallis Bamberg, PA-C  pseudoephedrine (SUDAFED) 60 MG tablet Take 1 tablet (60 mg total) by mouth every 6 (six) hours as needed for congestion. 11/21/21   Wallis Bamberg, PA-C  SYMBICORT 160-4.5 MCG/ACT inhaler SMARTSIG:2 Puff(s) By Mouth Twice Daily 07/17/21   [provider]  valACYclovir (VALTREX) 500 MG tablet TAKE AS DIRECTED FOR 3 DAYS AS NEEDED FOR  EACH OUTBREAK 07/13/21   Brock Bad, MD  VENTOLIN HFA 108 [phone] Base) MCG/ACT inhaler SMARTSIG:1 Puff(s) By Mouth Every 4-6 Hours PRN 06/11/21   [provider]    Family History Family History  Problem Relation Age of Onset   Diabetes Mother    Hypertension Mother    Diabetes Sister    Hypertension Sister     Social History Social History   Tobacco Use   Smoking status: Never   Smokeless tobacco: Never  Vaping Use   Vaping Use: Never used  Substance Use Topics   Alcohol use: Not Currently    Comment: occ   Drug use: No     Allergies   Penicillins   Review of Systems Review of Systems  Genitourinary:        Vaginal itching  Skin:  Positive for rash.       Enlarged lymph node in right groin     Physical Exam Triage Vital  Signs ED Triage Vitals  Enc Vitals Group     BP 05/20/22 0915 130/82     Pulse Rate 05/20/22 0915 73     Resp 05/20/22 0915 15     Temp 05/20/22 0915 98.3 F (36.8 C)     Temp Source 05/20/22 0915 Oral     SpO2 05/20/22 0915 94 %     Weight --      Height --      Head Circumference --      Peak Flow --      Pain Score 05/20/22 0923 6     Pain Loc --      Pain Edu? --      Excl. in GC? --    No data found.  Updated Vital Signs BP 130/82 (BP Location: Right Arm)   Pulse 73   Temp 98.3 F (36.8 C) (Oral)   Resp 15   LMP 05/13/2022   SpO2 94%   Breastfeeding No   Visual Acuity Right Eye Distance:   Left Eye Distance:   Bilateral Distance:    Right Eye Near:   Left Eye Near:    Bilateral Near:     Physical Exam Vitals and nursing note reviewed.  Constitutional:      Appearance: Normal appearance.  HENT:     Head: Normocephalic and atraumatic.  Eyes:     Pupils: Pupils are equal, round, and reactive to light.  Cardiovascular:     Rate and Rhythm: Normal rate.  Pulmonary:     Effort: Pulmonary effort is normal.  Skin:    General: Skin is warm and dry.          Comments: There are 3 slightly raised mildly erythematous lesions to the right buttock that are in a linear pattern.  No vesicles, drainage, warmth.  Nontender to palpation.  No induration or fluctuance.  No swelling.  Each lesion has a dark center but no scabbing.  Neurological:     General: No focal deficit present.     Mental Status: She is alert and oriented to person, place, and time.  Psychiatric:        Mood and Affect: Mood normal.        Behavior: Behavior normal.      UC Treatments / Results  Labs (all labs ordered are listed, but only abnormal results are displayed) Labs Reviewed  HSV CULTURE AND TYPING  RPR  HIV ANTIBODY (ROUTINE TESTING W REFLEX)  POCT URINALYSIS DIP (MANUAL ENTRY)  POCT  URINE PREGNANCY  CERVICOVAGINAL ANCILLARY ONLY    EKG   Radiology No results  found.  Procedures Procedures (including critical care time)  Medications Ordered in UC Medications - No data to display  Initial Impression / Assessment and Plan / UC Course  I have reviewed the triage vital signs and the nursing notes.  Pertinent labs & imaging results that were available during my care of the patient were reviewed by me and considered in my medical decision making (see chart for details).     Reviewed exam and symptoms with patient.  UA and hCG negative.  STD testing is ordered and will contact if positive Will start Bactrim to treat for skin infection while awaiting results.  Does not seem consistent with HSV or abscess.  Questionable atypical presentation. Start Diflucan for vaginal itching Patient follow-up with PCP if symptoms do not improve ER precautions reviewed and patient verbalized understanding Final Clinical Impressions(s) / UC Diagnoses   Final diagnoses:  Acute vaginitis  Skin lesion  Screening examination for STD (sexually transmitted disease)  Enlarged lymph nodes     Discharge Instructions      The clinic will contact you with results of the testing done today if positive Diflucan as prescribed Start Bactrim to cover for possible skin infection Please follow-up with your PCP if your symptoms do not improve Please go to the emergency room for any worsening symptoms     ED Prescriptions     Medication Sig Dispense Auth. Provider   fluconazole (DIFLUCAN) 150 MG tablet Take 1 tablet (150 mg total) by mouth daily for 2 doses. Take 1 dose today and may repeat in 3 days if symptoms persist 2 tablet Radford Pax, NP   sulfamethoxazole-trimethoprim (BACTRIM DS) 800-160 MG tablet Take 1 tablet by mouth 2 (two) times daily for 7 days. 14 tablet Radford Pax, NP      PDMP not reviewed this encounter.   Radford Pax, NP 05/20/22 [phone]

## 2022-05-21 LAB — CERVICOVAGINAL ANCILLARY ONLY
Bacterial Vaginitis (gardnerella): NEGATIVE
Candida Glabrata: NEGATIVE
Candida Vaginitis: POSITIVE — AB
Chlamydia: NEGATIVE
Comment: NEGATIVE
Comment: NEGATIVE
Comment: NEGATIVE
Comment: NEGATIVE
Comment: NEGATIVE
Comment: NORMAL
Neisseria Gonorrhea: NEGATIVE
Trichomonas: NEGATIVE

## 2022-05-23 ENCOUNTER — Ambulatory Visit: Payer: Managed Care, Other (non HMO) | Admitting: Obstetrics

## 2022-05-23 ENCOUNTER — Ambulatory Visit
Admission: EM | Admit: 2022-05-23 | Discharge: 2022-05-23 | Disposition: A | Discharge status: Home / Self Care | Payer: Managed Care, Other (non HMO) | Attending: Urgent Care | Admitting: Urgent Care

## 2022-05-23 DIAGNOSIS — N766 Ulceration of vulva: Secondary | ICD-10-CM

## 2022-05-23 DIAGNOSIS — B009 Herpesviral infection, unspecified: Secondary | ICD-10-CM

## 2022-05-23 LAB — HSV CULTURE AND TYPING

## 2022-05-23 MED ORDER — VALACYCLOVIR HCL 1 G PO TABS: 1000.0000 mg | ORAL_TABLET | Freq: Three times a day (TID) | ORAL | 0 refills | Status: DC

## 2022-05-23 MED ORDER — DOXYCYCLINE HYCLATE 100 MG PO CAPS: 100.0000 mg | ORAL_CAPSULE | Freq: Two times a day (BID) | ORAL | 0 refills | Status: DC

## 2022-05-23 NOTE — ED Provider Notes (Signed)
[facility]  Note:  This document was prepared using Conservation officer, historic buildings and may include unintentional dictation errors.  MRN: [id number] DOB: 07-14-1995  Subjective:   Ashley [name] is a 27 y.o. female presenting for recheck on ulcerations of her right buttock. Was last seen 05/20/2022.  Was started on Bactrim and fluconazole.  Has a history of HSV-2 infection, cultures were obtained and are still pending.  RPR and HIV testing is also still pending.  The vaginal swab results were negative for everything except Candida vaginitis.  Unfortunately, now her symptoms have progressed and she has developed significant redness, swelling, tenderness over the right groin area.  No ulcerations have been noted there.  Reports that her partner had very similar symptoms over that area including an ulceration.  This was treated with doxycycline and he had significant improvement.  They were sexually active despite him not having completed his treatment and stopping his doxycycline.  She would like to get recommendations on what she should do going forward.  Has had other symptoms including throat pain, fever, headache, nausea without vomiting.  No current facility-administered medications for this encounter.  Current Outpatient Medications:    azelastine (ASTELIN) 0.1 % nasal spray, Place 1 spray into both nostrils 2 (two) times daily. Use in each nostril as directed, Disp: 30 mL, Rfl: 0   cetirizine (ZYRTEC ALLERGY) 10 MG tablet, Take 1 tablet (10 mg total) by mouth daily., Disp: 30 tablet, Rfl: 0   ethynodiol-ethinyl estradiol (ZOVIA) 1-35 MG-MCG tablet, Take 1 tablet by mouth daily., Disp: 28 tablet, Rfl: 11   fluticasone (FLONASE) 50 MCG/ACT nasal spray, Place 2 sprays into both nostrils daily., Disp: 16 g, Rfl: 0   naproxen (NAPROSYN) 500 MG tablet, Take 1 tablet (500 mg total) by mouth 2 (two) times daily with a meal., Disp: 30 tablet, Rfl: 0   pseudoephedrine  (SUDAFED) 60 MG tablet, Take 1 tablet (60 mg total) by mouth every 6 (six) hours as needed for congestion., Disp: 30 tablet, Rfl: 0   sulfamethoxazole-trimethoprim (BACTRIM DS) 800-160 MG tablet, Take 1 tablet by mouth 2 (two) times daily for 7 days., Disp: 14 tablet, Rfl: 0   SYMBICORT 160-4.5 MCG/ACT inhaler, SMARTSIG:2 Puff(s) By Mouth Twice Daily, Disp: , Rfl:    valACYclovir (VALTREX) 500 MG tablet, TAKE AS DIRECTED FOR 3 DAYS AS NEEDED FOR EACH OUTBREAK, Disp: 90 tablet, Rfl: 3   VENTOLIN HFA 108 (90 Base) MCG/ACT inhaler, SMARTSIG:1 Puff(s) By Mouth Every 4-6 Hours PRN, Disp: , Rfl:    Allergies  Allergen Reactions   Penicillins Anaphylaxis, Hives and Rash    Foamed from mouth Has patient had a PCN reaction causing immediate rash, facial/tongue/throat swelling, SOB or lightheadedness with hypotension: Yes Has patient had a PCN reaction causing severe rash involving mucus membranes or skin necrosis: No Has patient had a PCN reaction that required hospitalization No Has patient had a PCN reaction occurring within the last 10 years: No If all of the above answers are "NO", then may proceed with Cephalosporin use.     Past Medical History:  Diagnosis Date   Concussion without loss of consciousness 12/02/2014     Past Surgical History:  Procedure Laterality Date   KNEE SURGERY Left 12/2019    Family History  Problem Relation Age of Onset   Diabetes Mother    Hypertension Mother    Diabetes Sister    Hypertension Sister     Social History   Tobacco Use  Smoking status: Never   Smokeless tobacco: Never  Vaping Use   Vaping Use: Never used  Substance Use Topics   Alcohol use: Not Currently    Comment: occ   Drug use: No    ROS   Objective:   Vitals: BP 102/70 (BP Location: Right Arm)   Pulse (!) 101   Temp 98.3 F (36.8 C) (Oral)   Resp 18   LMP 05/13/2022   SpO2 95%   Physical Exam Constitutional:      General: She is not in acute distress.     Appearance: Normal appearance. She is well-developed. She is not ill-appearing, toxic-appearing or diaphoretic.  HENT:     Head: Normocephalic and atraumatic.     Nose: Nose normal.     Mouth/Throat:     Mouth: Mucous membranes are moist.     Pharynx: No pharyngeal swelling, oropharyngeal exudate, posterior oropharyngeal erythema or uvula swelling.     Tonsils: No tonsillar exudate or tonsillar abscesses. 0 on the right. 0 on the left.  Eyes:     General: No scleral icterus.       Right eye: No discharge.        Left eye: No discharge.     Extraocular Movements: Extraocular movements intact.  Cardiovascular:     Rate and Rhythm: Normal rate.  Pulmonary:     Effort: Pulmonary effort is normal.  Abdominal:     Hernia: There is no hernia in the left inguinal area or right inguinal area.    Genitourinary:    Labia:        Right: No rash, tenderness, lesion or injury.        Left: No rash, tenderness, lesion or injury.     Lymphadenopathy:     Lower Body: Right inguinal adenopathy present. No left inguinal adenopathy.  Skin:    General: Skin is warm and dry.  Neurological:     General: No focal deficit present.     Mental Status: She is alert and oriented to person, place, and time.  Psychiatric:        Mood and Affect: Mood normal.        Behavior: Behavior normal.     Assessment and Plan :   PDMP not reviewed this encounter.  1. Genital ulcer, female   2. HSV-2 infection     Discussed differential which includes cellulitis, lymphoma granuloma venereum.  Will cover for this with doxycycline.  I am not able to test for LGV.  However, had an extensive discussion with patient about cellulitis, LGV, STIs.  Also reviewed extensively the possibility of an HSV flareup and recommended valacyclovir.  Labs pending. Counseled patient on potential for adverse effects with medications prescribed/recommended today, ER and return-to-clinic precautions discussed, patient verbalized  understanding.    Wallis Bamberg, [place] 05/23/22 [phone]

## 2022-05-23 NOTE — Discharge Instructions (Addendum)
Please start doxycycline to address a possible cellulitis infection, possible lymphoma granuloma venereum. Unfortunately, I cannot test for this with your presentation as I need to culture the area. But you can start the medication to take care of the infection. Please also start the valacyclovir to address a suspected recurrent HSV flare up.

## 2022-05-23 NOTE — ED Triage Notes (Signed)
Pt was seen here on Monday for 3 ulcers to R buttocks, as well as swollen lymph node at R groin. Had concern for STD d/t husband having similar ulcers. Treated with bactrim and metronidazole (for yeast infection). Vaginal swabs neg. Blood work pending. Since that time pt reports sore throat, fever (Tmax 102.8), HA (no relief with maintenance and PRN migraine meds), persistent nausea without vomiting, and welts to R upper thigh, along with several scattered itchy bumps.

## 2022-05-23 NOTE — ED Notes (Signed)
This RN present for exam of groin and buttocks by M. Mani, PA-C. Pt tolerated well.

## 2022-05-25 LAB — HIV ANTIBODY (ROUTINE TESTING W REFLEX): HIV Screen 4th Generation wRfx: NONREACTIVE

## 2022-05-25 LAB — RPR: RPR Ser Ql: NONREACTIVE

## 2022-06-14 ENCOUNTER — Ambulatory Visit
Admission: RE | Admit: 2022-06-14 | Discharge: 2022-06-14 | Disposition: A | Discharge status: Home / Self Care | Payer: Managed Care, Other (non HMO) | Source: Ambulatory Visit | Attending: Internal Medicine | Admitting: Internal Medicine

## 2022-06-14 VITALS — BP 116/80 | HR 75 | Temp 99.2°F | Resp 18

## 2022-06-14 DIAGNOSIS — H6502 Acute serous otitis media, left ear: Secondary | ICD-10-CM | POA: Diagnosis not present

## 2022-06-14 DIAGNOSIS — J302 Other seasonal allergic rhinitis: Secondary | ICD-10-CM

## 2022-06-14 LAB — POCT URINE PREGNANCY: Preg Test, Ur: NEGATIVE

## 2022-06-14 MED ORDER — PSEUDOEPHEDRINE HCL ER 120 MG PO TB12: 120.0000 mg | ORAL_TABLET | Freq: Two times a day (BID) | ORAL | 0 refills | Status: DC

## 2022-06-14 MED ORDER — PREDNISONE 20 MG PO TABS: 20.0000 mg | ORAL_TABLET | Freq: Every day | ORAL | 0 refills | Status: DC

## 2022-06-14 NOTE — ED Triage Notes (Signed)
Pt reports she has  left ear fullness and itching, left side throat itching, left nostril clogged, and sneezing x 2  days. Took norel, benadryl, and tylenol which only gave slight relief.

## 2022-06-14 NOTE — ED Provider Notes (Signed)
[facility]    CSN: [id number] Arrival date & time: 06/14/22  1548      History   Chief Complaint Chief Complaint  Patient presents with   Ear Fullness    I feel as if I may have an ear infection? - Entered by patient    HPI Ashley [name] is a 27 y.o. female who presents with L ear fullness and itching, L throat itching, and L nose stuffy and has been sneezing x 2 days. Has tried norel, benadryl and tylenol with slight relief.     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 Authorizing Provider  predniSONE (DELTASONE) 20 MG tablet Take 1 tablet (20 mg total) by mouth daily with breakfast. 06/14/22  Yes Rodriguez-Southworth, Nettie Elm, PA-C  pseudoephedrine (SUDAFED) 120 MG 12 hr tablet Take 1 tablet (120 mg total) by mouth 2 (two) times daily. 06/14/22  Yes Rodriguez-Southworth, Viviana Simpler  SYMBICORT 160-4.5 MCG/ACT inhaler SMARTSIG:2 Puff(s) By Mouth Twice Daily 07/17/21   [provider]  valACYclovir (VALTREX) 1000 MG tablet Take 1 tablet (1,000 mg total) by mouth 3 (three) times daily. 05/23/22   Wallis Bamberg, PA-C  VENTOLIN HFA 108 [phone] Base) MCG/ACT inhaler SMARTSIG:1 Puff(s) By Mouth Every 4-6 Hours PRN 06/11/21   [provider]    Family History Family History  Problem Relation Age  of Onset   Diabetes Mother    Hypertension Mother    Diabetes Sister    Hypertension Sister     Social History Social History   Tobacco Use   Smoking status: Never   Smokeless tobacco: Never  Vaping Use   Vaping Use: Never used  Substance Use Topics   Alcohol use: Not Currently    Comment: occ   Drug use: No     Allergies   Penicillins   Review of Systems Review of Systems She missed her cycle this month and has not been using birth control  Physical Exam Triage Vital Signs ED Triage Vitals [06/14/22 1615]  Enc Vitals Group     BP 116/80     Pulse Rate 75     Resp 18     Temp 99.2 F (37.3 C)     Temp Source Oral     SpO2 96 %     Weight      Height      Head Circumference      Peak Flow      Pain Score 7     Pain Loc      Pain Edu?  Excl. in GC?    No data found.  Updated Vital Signs BP 116/80 (BP Location: Right Arm)   Pulse 75   Temp 99.2 F (37.3 C) (Oral)   Resp 18   LMP 05/13/2022   SpO2 96%   Visual Acuity Right Eye Distance:   Left Eye Distance:   Bilateral Distance:    Right Eye Near:   Left Eye Near:    Bilateral Near:     Physical Exam Alert pt NAD who seems nasally congested EYES- non icterus, mild watering, no purulent drainage NOSE- moderate mucosa congestion which is pale pink with clear mucous. No sinus tenderness TM- both gray and little dull, L>R, canals are normal PHARYNX- clear, clear drainage noted.  NECK- supple with no nodes LUNGS- clear HEART - RRR with no murmurs SKIN- non jaundiced, no rashes.    UC Treatments / Results  Labs (all labs ordered are listed, but only abnormal results are displayed) Labs Reviewed  POCT URINE PREGNANCY   Urine pregnancy test is negative EKG   Radiology No results found.  Procedures Procedures (including critical care time)  Medications Ordered in UC Medications - No data to display  Initial Impression / Assessment and Plan / UC Course  I have reviewed the  triage vital signs and the nursing notes.  Pertinent labs  results that were available during my care of the patient were reviewed by me and considered in my medical decision making (see chart for details).  L SOM Eustachian tube dysfunction Allergic rhinitis  I placed her on Prednisone and Sudafed as noted and advised to take Claritin qd.   Final Clinical Impressions(s) / UC Diagnoses   Final diagnoses:  Non-recurrent acute serous otitis media of left ear  Seasonal allergic rhinitis due to fungal spores     Discharge Instructions      Your pregnancy test is negative. If you dont want to be pregnant make sure to have your partner wear a condom. You may take Claritin 10 mg daily for the next month until your allergies improve      ED Prescriptions     Medication Sig Dispense Auth. Provider   pseudoephedrine (SUDAFED) 120 MG 12 hr tablet Take 1 tablet (120 mg total) by mouth 2 (two) times daily. 20 tablet Rodriguez-Southworth, Nettie Elm, PA-C   predniSONE (DELTASONE) 20 MG tablet Take 1 tablet (20 mg total) by mouth daily with breakfast. 5 tablet Rodriguez-Southworth, Nettie Elm, PA-C      PDMP not reviewed this encounter.   Garey Ham, PA-C 06/14/22 1706

## 2022-06-14 NOTE — Discharge Instructions (Signed)
Your pregnancy test is negative. If you dont want to be pregnant make sure to have your partner wear a condom. You may take Claritin 10 mg daily for the next month until your allergies improve

## 2022-06-17 ENCOUNTER — Telehealth: Payer: Managed Care, Other (non HMO) | Admitting: Physician Assistant

## 2022-06-17 DIAGNOSIS — J019 Acute sinusitis, unspecified: Secondary | ICD-10-CM | POA: Diagnosis not present

## 2022-06-17 DIAGNOSIS — B9689 Other specified bacterial agents as the cause of diseases classified elsewhere: Secondary | ICD-10-CM

## 2022-06-17 MED ORDER — DOXYCYCLINE HYCLATE 100 MG PO TABS: 100.0000 mg | ORAL_TABLET | Freq: Two times a day (BID) | ORAL | 0 refills | Status: DC

## 2022-06-17 NOTE — Progress Notes (Signed)
E-Visit for Sinus Problems  We are sorry that you are not feeling well.  Here is how we plan to help!  Based on what you have shared with me it looks like you have sinusitis.  Sinusitis is inflammation and infection in the sinus cavities of the head.  Based on your presentation I believe you most likely have Acute Bacterial Sinusitis.  This is an infection caused by bacteria and is treated with antibiotics. I have prescribed Doxycycline 100mg by mouth twice a day for 10 days. You may use an oral decongestant such as Mucinex D or if you have glaucoma or high blood pressure use plain Mucinex. Saline nasal spray help and can safely be used as often as needed for congestion.  If you develop worsening sinus pain, fever or notice severe headache and vision changes, or if symptoms are not better after completion of antibiotic, please schedule an appointment with a health care provider.    Sinus infections are not as easily transmitted as other respiratory infection, however we still recommend that you avoid close contact with loved ones, especially the very young and elderly.  Remember to wash your hands thoroughly throughout the day as this is the number one way to prevent the spread of infection!  Home Care: Only take medications as instructed by your medical team. Complete the entire course of an antibiotic. Do not take these medications with alcohol. A steam or ultrasonic humidifier can help congestion.  You can place a towel over your head and breathe in the steam from hot water coming from a faucet. Avoid close contacts especially the very young and the elderly. Cover your mouth when you cough or sneeze. Always remember to wash your hands.  Get Help Right Away If: You develop worsening fever or sinus pain. You develop a severe head ache or visual changes. Your symptoms persist after you have completed your treatment plan.  Make sure you Understand these instructions. Will watch your  condition. Will get help right away if you are not doing well or get worse.  Thank you for choosing an e-visit.  Your e-visit answers were reviewed by a board certified advanced clinical practitioner to complete your personal care plan. Depending upon the condition, your plan could have included both over the counter or prescription medications.  Please review your pharmacy choice. Make sure the pharmacy is open so you can pick up prescription now. If there is a problem, you may contact your provider through MyChart messaging and have the prescription routed to another pharmacy.  Your safety is important to us. If you have drug allergies check your prescription carefully.   For the next 24 hours you can use MyChart to ask questions about today's visit, request a non-urgent call back, or ask for a work or school excuse. You will get an email in the next two days asking about your experience. I hope that your e-visit has been valuable and will speed your recovery.  I have spent 5 minutes in review of e-visit questionnaire, review and updating patient chart, medical decision making and response to patient.   Tramaine Snell M Trygve Thal, PA-C  

## 2022-06-25 ENCOUNTER — Encounter: Payer: Self-pay | Admitting: Obstetrics

## 2022-06-25 ENCOUNTER — Ambulatory Visit: Payer: Managed Care, Other (non HMO) | Admitting: Obstetrics

## 2022-06-25 VITALS — BP 114/81 | HR 82 | Ht 68.0 in | Wt 133.3 lb

## 2022-06-25 DIAGNOSIS — F325 Major depressive disorder, single episode, in full remission: Secondary | ICD-10-CM | POA: Diagnosis not present

## 2022-06-25 DIAGNOSIS — Z3041 Encounter for surveillance of contraceptive pills: Secondary | ICD-10-CM

## 2022-06-25 DIAGNOSIS — E569 Vitamin deficiency, unspecified: Secondary | ICD-10-CM

## 2022-06-25 DIAGNOSIS — Z30013 Encounter for initial prescription of injectable contraceptive: Secondary | ICD-10-CM | POA: Diagnosis not present

## 2022-06-25 DIAGNOSIS — Z3009 Encounter for other general counseling and advice on contraception: Secondary | ICD-10-CM

## 2022-06-25 LAB — POCT URINE PREGNANCY: Preg Test, Ur: NEGATIVE

## 2022-06-25 MED ORDER — MEDROXYPROGESTERONE ACETATE 150 MG/ML IM SUSP: 150.0000 mg | INTRAMUSCULAR | 4 refills | Status: DC

## 2022-06-25 MED ORDER — PNV-DHA+DOCUSATE 27-1.25-300 MG PO CAPS: 1.0000 | ORAL_CAPSULE | Freq: Every day | ORAL | 4 refills | Status: DC

## 2022-06-25 MED ORDER — MEDROXYPROGESTERONE ACETATE 150 MG/ML IM SUSP
150.0000 mg | Freq: Once | INTRAMUSCULAR | Status: AC
  Administered 2022-06-25: 150 mg via INTRAMUSCULAR

## 2022-06-25 NOTE — Progress Notes (Signed)
Ashley [name] is a 27 y.o. female who presents for contraception counseling. The patient has no complaints today. The patient is sexually active. Pertinent past medical history: none.  The information documented in the HPI was reviewed and verified. Subjective:   Menstrual History: OB History     Gravida  3   Para  2   Term  2   Preterm      AB      Living  2      SAB      IAB      Ectopic      Multiple  0   Live Births  2           Patient's last menstrual period was 06/21/2022 (approximate).   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   Vitamin D deficiency 08/13/2017   Past Medical History:  Diagnosis Date   Concussion without loss of consciousness 12/02/2014    Past Surgical History:  Procedure Laterality Date   KNEE SURGERY Left 12/2019     Current Outpatient Medications:    medroxyPROGESTERone (DEPO-PROVERA) 150 MG/ML injection, Inject 1 mL (150 mg total) into the muscle every 3 (three) months., Disp: 1 mL, Rfl: 4   ondansetron (ZOFRAN-ODT) 4 MG disintegrating tablet, Take by mouth., Disp: , Rfl:    predniSONE (DELTASONE) 20 MG tablet, Take 1 tablet (20 mg total) by mouth daily with breakfast., Disp: 5 tablet, Rfl: 0   Prenat-FeFum-DSS-FA-DHA w/o A (PNV-DHA+DOCUSATE) 27-1.25-300 MG CAPS, Take 1 capsule by mouth daily before breakfast., Disp: 90 capsule, Rfl: 4   pseudoephedrine (SUDAFED) 120 MG 12 hr tablet, Take 1 tablet (120 mg total) by mouth 2 (two) times daily., Disp: 20 tablet, Rfl: 0   rizatriptan (MAXALT) 5 MG tablet, Take by mouth., Disp: , Rfl:    SUMAtriptan (IMITREX) 25 MG tablet, PLEASE SEE ATTACHED FOR DETAILED DIRECTIONS, Disp: , Rfl:    SYMBICORT 160-4.5 MCG/ACT inhaler, SMARTSIG:2 Puff(s) By Mouth Twice Daily, Disp: , Rfl:    topiramate (TOPAMAX) 25 MG tablet,  Take 25 mg by mouth at bedtime., Disp: , Rfl:    valACYclovir (VALTREX) 1000 MG tablet, Take 1 tablet (1,000 mg total) by mouth 3 (three) times daily., Disp: 21 tablet, Rfl: 0   VENTOLIN HFA 108 (90 Base) MCG/ACT inhaler, SMARTSIG:1 Puff(s) By Mouth Every 4-6 Hours PRN, Disp: , Rfl:    Doxylamine-Pyridoxine (DICLEGIS) 10-10 MG TBEC, Take 1 tablet by mouth daily. (Patient not taking: Reported on 06/25/2022), Disp: , Rfl:    fluconazole (DIFLUCAN) 150 MG tablet, Take 1 tablet PO once. Repeat in 3 days if needed., Disp: 2 tablet, Rfl: 0 Allergies  Allergen Reactions   Penicillins Anaphylaxis, Hives and Rash    Foamed from mouth Has patient had a PCN reaction causing immediate rash, facial/tongue/throat swelling, SOB or lightheadedness with hypotension: Yes Has patient had a PCN reaction causing severe rash involving mucus membranes or skin necrosis: No Has patient had a PCN reaction that required hospitalization No Has patient had a PCN reaction occurring within the last 10 years: No If all of the above answers are "NO", then may proceed with Cephalosporin use.     Social History   Tobacco Use   Smoking status: Never   Smokeless tobacco: Never  Substance Use Topics   Alcohol use: Not Currently    Comment: occ  Family History  Problem Relation Age of Onset   Diabetes Mother    Hypertension Mother    Diabetes Sister    Hypertension Sister        Review of Systems Constitutional: negative for weight loss Genitourinary:negative for abnormal menstrual periods and vaginal discharge   Objective:   BP 114/81   Pulse 82   Ht 5\' 8"  (1.727 m)   Wt 133 lb 4.8 oz (60.5 kg)   LMP 06/21/2022 (Approximate)   BMI 20.27 kg/m    General:   alert  Skin:   no rash or abnormalities  Lungs:   clear to auscultation bilaterally  Heart:   regular rate and rhythm, S1, S2 normal, no murmur, click, rub or gallop  Breasts:   normal without suspicious masses, skin or nipple changes or axillary  nodes  Abdomen:  normal findings: no organomegaly, soft, non-tender and no hernia  Pelvis:  External genitalia: normal general appearance Urinary system: urethral meatus normal and bladder without fullness, nontender Vaginal: normal without tenderness, induration or masses Cervix: normal appearance Adnexa: normal bimanual exam Uterus: anteverted and non-tender, normal size   Lab Review Urine pregnancy test Labs reviewed yes Radiologic studies reviewed no   Assessment:    27 y.o., starting Depo-Provera injections, no contraindications.   Plan:    All questions answered. Contraception: Depo-Provera injections. Discussed healthy lifestyle modifications.  Meds ordered this encounter  Medications   medroxyPROGESTERone (DEPO-PROVERA) injection 150 mg   medroxyPROGESTERone (DEPO-PROVERA) 150 MG/ML injection    Sig: Inject 1 mL (150 mg total) into the muscle every 3 (three) months.    Dispense:  1 mL    Refill:  4   Prenat-FeFum-DSS-FA-DHA w/o A (PNV-DHA+DOCUSATE) 27-1.25-300 MG CAPS    Sig: Take 1 capsule by mouth daily before breakfast.    Dispense:  90 capsule    Refill:  4   Orders Placed This Encounter  Procedures   POCT urine pregnancy   Need to obtain previous records Follow up as needed.    Brock Bad, MD 06/28/2022 2:10 PM

## 2022-06-25 NOTE — Progress Notes (Addendum)
Pt presents for birth control consult. Stopped taking combo OCP in April due headaches, mood changes and inc BP. Desires another pill or Depo today.  Wants to defer pap today, is on menstrual cycle.   Date last pap: 06/12/2020. Last Depo-Provera: Initiated today. Side Effects if any: NONE. Serum HCG indicated? Negative in office. Depo-Provera 150 mg IM given by:  Next appointment due Aug 5-Aug 19.

## 2022-06-25 NOTE — Progress Notes (Signed)
error 

## 2022-06-27 ENCOUNTER — Telehealth: Payer: Managed Care, Other (non HMO) | Admitting: Physician Assistant

## 2022-06-27 DIAGNOSIS — T3695XA Adverse effect of unspecified systemic antibiotic, initial encounter: Secondary | ICD-10-CM | POA: Diagnosis not present

## 2022-06-27 DIAGNOSIS — B379 Candidiasis, unspecified: Secondary | ICD-10-CM | POA: Diagnosis not present

## 2022-06-27 MED ORDER — FLUCONAZOLE 150 MG PO TABS: ORAL_TABLET | ORAL | 0 refills | Status: DC

## 2022-06-27 NOTE — Progress Notes (Signed)

## 2022-06-27 NOTE — Addendum Note (Signed)
Addended by: Waldon Merl on: 06/27/2022 02:22 PM   Modules accepted: Orders

## 2022-06-27 NOTE — Progress Notes (Signed)
I have spent 5 minutes in review of e-visit questionnaire, review and updating patient chart, medical decision making and response to patient.   Nana Vastine Cody Jaysun Wessels, PA-C    

## 2022-08-06 ENCOUNTER — Other Ambulatory Visit: Payer: Self-pay | Admitting: Obstetrics

## 2022-08-06 DIAGNOSIS — B009 Herpesviral infection, unspecified: Secondary | ICD-10-CM

## 2022-08-16 ENCOUNTER — Encounter: Payer: Self-pay | Admitting: Obstetrics

## 2022-08-19 ENCOUNTER — Telehealth: Payer: Managed Care, Other (non HMO) | Admitting: Family Medicine

## 2022-08-19 DIAGNOSIS — R58 Hemorrhage, not elsewhere classified: Secondary | ICD-10-CM

## 2022-08-19 NOTE — Progress Notes (Signed)
Because we are not able to give you anything to stop the bleeding.  You will need to be seen if you have been bleeding this long. They can help with headache and nausea as well.    I feel your condition warrants further evaluation and I recommend that you be seen in a face to face visit at a local Urgent Care in person.   NOTE: There will be NO CHARGE for this eVisit

## 2022-09-03 ENCOUNTER — Ambulatory Visit
Admission: RE | Admit: 2022-09-03 | Discharge: 2022-09-03 | Disposition: A | Discharge status: Home / Self Care | Payer: BC Managed Care – PPO | Source: Ambulatory Visit | Attending: Emergency Medicine | Admitting: Emergency Medicine

## 2022-09-03 ENCOUNTER — Telehealth: Payer: BC Managed Care – PPO

## 2022-09-03 VITALS — BP 131/84 | HR 67 | Temp 99.5°F | Resp 17

## 2022-09-03 DIAGNOSIS — A5402 Gonococcal vulvovaginitis, unspecified: Secondary | ICD-10-CM | POA: Diagnosis not present

## 2022-09-03 DIAGNOSIS — B3731 Acute candidiasis of vulva and vagina: Secondary | ICD-10-CM | POA: Diagnosis not present

## 2022-09-03 DIAGNOSIS — N3001 Acute cystitis with hematuria: Secondary | ICD-10-CM | POA: Diagnosis not present

## 2022-09-03 DIAGNOSIS — Z113 Encounter for screening for infections with a predominantly sexual mode of transmission: Secondary | ICD-10-CM | POA: Insufficient documentation

## 2022-09-03 DIAGNOSIS — B9689 Other specified bacterial agents as the cause of diseases classified elsewhere: Secondary | ICD-10-CM | POA: Insufficient documentation

## 2022-09-03 DIAGNOSIS — N898 Other specified noninflammatory disorders of vagina: Secondary | ICD-10-CM | POA: Insufficient documentation

## 2022-09-03 DIAGNOSIS — N760 Acute vaginitis: Secondary | ICD-10-CM | POA: Diagnosis not present

## 2022-09-03 LAB — POCT URINALYSIS DIP (MANUAL ENTRY)
Bilirubin, UA: NEGATIVE
Glucose, UA: NEGATIVE mg/dL
Ketones, POC UA: NEGATIVE mg/dL
Nitrite, UA: NEGATIVE
Protein Ur, POC: NEGATIVE mg/dL
Spec Grav, UA: 1.015 (ref 1.010–1.025)
Urobilinogen, UA: 0.2 E.U./dL
pH, UA: 7.5 (ref 5.0–8.0)

## 2022-09-03 LAB — POCT URINE PREGNANCY: Preg Test, Ur: NEGATIVE

## 2022-09-03 MED ORDER — FLUCONAZOLE 150 MG PO TABS: 150.0000 mg | ORAL_TABLET | Freq: Once | ORAL | 0 refills | Status: AC

## 2022-09-03 MED ORDER — SULFAMETHOXAZOLE-TRIMETHOPRIM 800-160 MG PO TABS: 1.0000 | ORAL_TABLET | Freq: Two times a day (BID) | ORAL | 0 refills | Status: AC

## 2022-09-03 NOTE — Progress Notes (Signed)
I have spent 5 minutes in review of e-visit questionnaire, review and updating patient chart, medical decision making and response to patient.   William Cody Martin, PA-C    

## 2022-09-03 NOTE — Progress Notes (Signed)

## 2022-09-03 NOTE — ED Provider Notes (Signed)
[name]    CSN: [id number] Arrival date & time: 09/03/22  1712    HISTORY   Chief Complaint  Patient presents with   SEXUALLY TRANSMITTED DISEASE    Entered by patient   Vaginal Discharge   HPI Ashley [name] is a pleasant, 27 y.o. female who presents to urgent care today. Patient complains of increased vaginal discharge that is not abnormal in appearance, burning with urination, increased frequency of urination, suprapubic pain and mild lower back pain.  Patient denies blood in urine, perineal pain, flank pain, fever, chills, malaise, rigors, significant fatigue, vaginal irritation, and known exposure to STD.     The history is provided by the patient.   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 Authorizing Provider  sulfamethoxazole-trimethoprim (BACTRIM DS) 800-160 MG tablet Take 1 tablet by mouth 2 (two) times daily for 3 days. 09/03/22 09/06/22 Yes Theadora Rama Scales, PA-C  fluconazole (DIFLUCAN) 150 MG tablet Take 1 tablet (150 mg total) by mouth once for 1 dose. 09/03/22 09/03/22  Waldon Merl, PA-C  medroxyPROGESTERone (DEPO-PROVERA) 150 MG/ML injection Inject 1 mL (150 mg total) into the muscle every 3 (three) months. 06/25/22   Brock Bad, MD  ondansetron (ZOFRAN-ODT) 4 MG disintegrating  tablet Take by mouth. 07/20/17   [provider]  predniSONE (DELTASONE) 20 MG tablet Take 1 tablet (20 mg total) by mouth daily with breakfast. 06/14/22   Rodriguez-Southworth, Nettie Elm, PA-C  Prenat-FeFum-DSS-FA-DHA w/o A (PNV-DHA+DOCUSATE) 27-1.25-300 MG CAPS Take 1 capsule by mouth daily before breakfast. 06/25/22   Brock Bad, MD  pseudoephedrine (SUDAFED) 120 MG 12 hr tablet Take 1 tablet (120 mg total) by mouth 2 (two) times daily. 06/14/22   Rodriguez-Southworth, Nettie Elm, PA-C  rizatriptan (MAXALT) 5 MG tablet Take by mouth. 05/21/22   [provider]  SUMAtriptan (IMITREX) 25 MG tablet PLEASE SEE ATTACHED FOR DETAILED DIRECTIONS 03/26/22   [provider]  SYMBICORT 160-4.5 MCG/ACT inhaler SMARTSIG:2 Puff(s) By Mouth Twice Daily 07/17/21   [provider]  topiramate (TOPAMAX) 25 MG tablet Take 25 mg by mouth at bedtime. 03/26/22   [provider]  valACYclovir (VALTREX) 1000 MG tablet Take 1 tablet (1,000 mg total) by mouth 3 (three) times daily. 05/23/22   Wallis Bamberg, PA-C  valACYclovir (VALTREX) 500 MG tablet TAKE AS DIRECTED FOR 3 DAYS AS NEEDED FOR EACH OUTBREAK 08/06/22   Constant, Peggy, MD  VENTOLIN HFA 108 (90 Base) MCG/ACT inhaler SMARTSIG:1 Puff(s) By Mouth Every 4-6 Hours PRN 06/11/21   [provider]    Family History Family  History  Problem Relation Age of Onset   Diabetes Mother    Hypertension Mother    Diabetes Sister    Hypertension Sister    Social History Social History   Tobacco Use   Smoking status: Never   Smokeless tobacco: Never  Vaping Use   Vaping status: Never Used  Substance Use Topics   Alcohol use: Not Currently    Comment: occ   Drug use: No   Allergies   Penicillins  Review of Systems Review of Systems Pertinent findings revealed after performing a 14 point review of systems has been noted in the history of present illness.  Physical Exam Vital Signs BP 131/84 (BP Location: Right Arm)    Pulse 67   Temp 99.5 F (37.5 C) (Oral)   Resp 17   SpO2 98%   No data found.  Physical Exam Vitals and nursing note reviewed.  Constitutional:      General: She is not in acute distress.    Appearance: Normal appearance. She is not ill-appearing.  HENT:     Head: Normocephalic and atraumatic.  Eyes:     General: Lids are normal.        Right eye: No discharge.        Left eye: No discharge.     Extraocular Movements: Extraocular movements intact.     Conjunctiva/sclera: Conjunctivae normal.     Right eye: Right conjunctiva is not injected.     Left eye: Left conjunctiva is not injected.  Neck:     Trachea: Trachea and phonation normal.  Cardiovascular:     Rate and Rhythm: Normal rate and regular rhythm.     Pulses: Normal pulses.     Heart sounds: Normal heart sounds. No murmur heard.    No friction rub. No gallop.  Pulmonary:     Effort: Pulmonary effort is normal. No accessory muscle usage, prolonged expiration or respiratory distress.     Breath sounds: Normal breath sounds. No stridor, decreased air movement or transmitted upper airway sounds. No decreased breath sounds, wheezing, rhonchi or rales.  Chest:     Chest wall: No tenderness.  Genitourinary:    Comments: Patient politely declines pelvic exam today, patient provided a vaginal swab for testing. Musculoskeletal:        General: Normal range of motion.     Cervical back: Normal range of motion and neck supple. Normal range of motion.  Lymphadenopathy:     Cervical: No cervical adenopathy.  Skin:    General: Skin is warm and dry.     Findings: No erythema or rash.  Neurological:     General: No focal deficit present.     Mental Status: She is alert and oriented to person, place, and time.  Psychiatric:        Mood and Affect: Mood normal.        Behavior: Behavior normal.     Visual Acuity Right Eye Distance:   Left Eye Distance:   Bilateral Distance:    Right Eye Near:   Left Eye Near:     Bilateral Near:     UC Couse / Diagnostics / Procedures:     Radiology No results found.  Procedures Procedures (including critical care time) EKG  Pending results:  Labs Reviewed  POCT URINALYSIS DIP (MANUAL ENTRY) - Abnormal; Notable for the following components:      Result Value   Color, UA light yellow (*)    Clarity, UA cloudy (*)  Blood, UA trace-intact (*)    Leukocytes, UA Moderate (2+) (*)    All other components within normal limits  POCT URINE PREGNANCY  CERVICOVAGINAL ANCILLARY ONLY    Medications Ordered in UC: Medications - No data to display  UC Diagnoses / Final Clinical Impressions(s)   I have reviewed the triage vital signs and the nursing notes.  Pertinent labs & imaging results that were available during my care of the patient were reviewed by me and considered in my medical decision making (see chart for details).    Final diagnoses:  Screening examination for STD (sexually transmitted disease)  Acute cystitis with hematuria   STD screening was performed, patient advised that the results be posted to their MyChart and if any of the results are positive, they will be notified by phone, further treatment will be provided as indicated based on results of STD screening. Patient was advised to abstain from sexual intercourse until that they receive the results of their STD testing.  Patient was also advised to use condoms to protect themselves from STD exposure.   Urine pregnancy test was negative.    Patient was advised to begin antibiotics now due to findings on urine dip. Patient was advised to begin antibiotics today due to having active symptoms of urinary tract infection.                    Patient was advised to take all doses exactly as prescribed.  Patient also advised of risks of worsening infection with incomplete antibiotic therapy. Return precautions advised.  Please see discharge instructions below for details of plan of care as  provided to patient. ED Prescriptions     Medication Sig Dispense Auth. Provider   sulfamethoxazole-trimethoprim (BACTRIM DS) 800-160 MG tablet Take 1 tablet by mouth 2 (two) times daily for 3 days. 6 tablet Theadora Rama Scales, PA-C      PDMP not reviewed this encounter.  Disposition Upon Discharge:  Condition: stable for discharge home  Patient presented with concern for an acute illness with associated systemic symptoms and significant discomfort requiring urgent management. In my opinion, this is a condition that a prudent lay person (someone who possesses an average knowledge of health and medicine) may potentially expect to result in complications if not addressed urgently such as respiratory distress, impairment of bodily function or dysfunction of bodily organs.   As such, the patient has been evaluated and assessed, work-up was performed and treatment was provided in alignment with urgent care protocols and evidence based medicine.  Patient/parent/caregiver has been advised that the patient may require follow up for further testing and/or treatment if the symptoms continue in spite of treatment, as clinically indicated and appropriate.  Routine symptom specific, illness specific and/or disease specific instructions were discussed with the patient and/or caregiver at length.  Prevention strategies for avoiding STD exposure were also discussed.  The patient will follow up with their current PCP if and as advised. If the patient does not currently have a PCP we will assist them in obtaining one.   The patient may need specialty follow up if the symptoms continue, in spite of conservative treatment and management, for further workup, evaluation, consultation and treatment as clinically indicated and appropriate.  Patient/parent/caregiver verbalized understanding and agreement of plan as discussed.  All questions were addressed during visit.  Please see discharge instructions below for  further details of plan.  Discharge Instructions:   Discharge Instructions      Common causes  of urinary tract infections include but are not limited to holding your urine longer than you should, squatting instead of sitting down when urinating, sitting around in wet clothing such as a wet swimsuit or gym clothes too long, not emptying your bladder after having sexual intercourse, wiping from back to front instead of front to back after having a bowel movement.     The urinalysis that we performed in the clinic today was abnormal.     You were advised to begin antibiotics today because your urinalysis is abnormal and you are having active symptoms of an acute lower urinary tract infection also known as cystitis.     Please pick up and begin taking your prescription for Bactrim DS (trimethoprim sulfamethoxazole) as soon as possible.  Please take all doses exactly as prescribed.  You can take this medication with or without food.  This medication is safe to take with your other medications.  The results of your vaginal swab test which screens for BV, yeast, gonorrhea, chlamydia and trichomonas will be made posted to your MyChart account once it is complete.  This typically takes 2 to 4 days.  Please abstain from sexual intercourse of any kind, vaginal, oral or anal, until you have received the results of your STD testing.     If any of your results are abnormal, you will receive a phone call regarding treatment.  Prescriptions, if any are needed, will be provided for you at your pharmacy.       If you have not had complete resolution of your symptoms after completing treatment as prescribed, please return to urgent care for repeat evaluation or follow-up with your primary care provider.  Repeat urinalysis and urine culture may be indicated for more directed therapy.   Thank you for visiting urgent care today.  I appreciate the opportunity to participate in your care.     This office note  has been dictated using Teaching laboratory technician.  Unfortunately, this method of dictation can sometimes lead to typographical or grammatical errors.  I apologize for your inconvenience in advance if this occurs.  Please do not hesitate to reach out to me if clarification is needed.       Theadora Rama Scales, [place] 09/03/22 [phone]

## 2022-09-03 NOTE — Discharge Instructions (Signed)
Common causes of urinary tract infections include but are not limited to holding your urine longer than you should, squatting instead of sitting down when urinating, sitting around in wet clothing such as a wet swimsuit or gym clothes too long, not emptying your bladder after having sexual intercourse, wiping from back to front instead of front to back after having a bowel movement.     The urinalysis that we performed in the clinic today was abnormal.     You were advised to begin antibiotics today because your urinalysis is abnormal and you are having active symptoms of an acute lower urinary tract infection also known as cystitis.     Please pick up and begin taking your prescription for Bactrim DS (trimethoprim sulfamethoxazole) as soon as possible.  Please take all doses exactly as prescribed.  You can take this medication with or without food.  This medication is safe to take with your other medications.  The results of your vaginal swab test which screens for BV, yeast, gonorrhea, chlamydia and trichomonas will be made posted to your MyChart account once it is complete.  This typically takes 2 to 4 days.  Please abstain from sexual intercourse of any kind, vaginal, oral or anal, until you have received the results of your STD testing.     If any of your results are abnormal, you will receive a phone call regarding treatment.  Prescriptions, if any are needed, will be provided for you at your pharmacy.       If you have not had complete resolution of your symptoms after completing treatment as prescribed, please return to urgent care for repeat evaluation or follow-up with your primary care provider.  Repeat urinalysis and urine culture may be indicated for more directed therapy.   Thank you for visiting urgent care today.  I appreciate the opportunity to participate in your care.

## 2022-09-05 ENCOUNTER — Ambulatory Visit
Admission: EM | Admit: 2022-09-05 | Discharge: 2022-09-05 | Disposition: A | Discharge status: Home / Self Care | Payer: BC Managed Care – PPO | Attending: Internal Medicine | Admitting: Internal Medicine

## 2022-09-05 ENCOUNTER — Other Ambulatory Visit: Payer: Self-pay

## 2022-09-05 ENCOUNTER — Telehealth: Payer: Self-pay | Admitting: Emergency Medicine

## 2022-09-05 DIAGNOSIS — A64 Unspecified sexually transmitted disease: Secondary | ICD-10-CM

## 2022-09-05 DIAGNOSIS — B9689 Other specified bacterial agents as the cause of diseases classified elsewhere: Secondary | ICD-10-CM

## 2022-09-05 DIAGNOSIS — B3731 Acute candidiasis of vulva and vagina: Secondary | ICD-10-CM

## 2022-09-05 DIAGNOSIS — A549 Gonococcal infection, unspecified: Secondary | ICD-10-CM

## 2022-09-05 MED ORDER — CEFTRIAXONE SODIUM 500 MG IJ SOLR
500.0000 mg | INTRAMUSCULAR | Status: DC
  Administered 2022-09-05: 500 mg via INTRAMUSCULAR

## 2022-09-05 MED ORDER — FLUCONAZOLE 150 MG PO TABS
ORAL_TABLET | ORAL | 0 refills | Status: DC
Start: 2022-09-05 — End: 2022-11-12

## 2022-09-05 MED ORDER — METRONIDAZOLE 0.75 % VA GEL: VAGINAL | 0 refills | Status: DC

## 2022-09-05 NOTE — ED Notes (Signed)
Allergies and medication hx reviewed with provider Ashlee Hermanns, PA and approves administration of Rocephin 500mg  IM.

## 2022-09-05 NOTE — Telephone Encounter (Signed)
Patient tested positive for gonorrhea.  Patient advised to return to clinic for treatment with ceftriaxone.  Patient tested positive for bacterial vaginosis, 5-day prescription for metronidazole 0.75% vaginal gel administered PV nightly x 5 nights sent to pharmacy.  Patient tested positive for vaginal Candida, prescription for Diflucan 150 mg every 72 hours x 2 doses sent to pharmacy.

## 2022-09-05 NOTE — ED Triage Notes (Signed)
Here for txt for Gonorrhea only.

## 2022-09-07 DIAGNOSIS — R5383 Other fatigue: Secondary | ICD-10-CM | POA: Diagnosis not present

## 2022-09-07 DIAGNOSIS — T887XXA Unspecified adverse effect of drug or medicament, initial encounter: Secondary | ICD-10-CM | POA: Diagnosis not present

## 2022-09-07 DIAGNOSIS — F1990 Other psychoactive substance use, unspecified, uncomplicated: Secondary | ICD-10-CM | POA: Diagnosis not present

## 2022-09-07 DIAGNOSIS — T50904A Poisoning by unspecified drugs, medicaments and biological substances, undetermined, initial encounter: Secondary | ICD-10-CM | POA: Diagnosis not present

## 2022-09-07 DIAGNOSIS — R464 Slowness and poor responsiveness: Secondary | ICD-10-CM | POA: Diagnosis not present

## 2022-09-07 DIAGNOSIS — T450X2A Poisoning by antiallergic and antiemetic drugs, intentional self-harm, initial encounter: Secondary | ICD-10-CM | POA: Diagnosis not present

## 2022-09-07 DIAGNOSIS — R4182 Altered mental status, unspecified: Secondary | ICD-10-CM | POA: Diagnosis not present

## 2022-09-07 DIAGNOSIS — G4700 Insomnia, unspecified: Secondary | ICD-10-CM | POA: Diagnosis not present

## 2022-09-07 DIAGNOSIS — Z79899 Other long term (current) drug therapy: Secondary | ICD-10-CM | POA: Diagnosis not present

## 2022-09-10 ENCOUNTER — Ambulatory Visit
Admission: RE | Admit: 2022-09-10 | Discharge: 2022-09-10 | Disposition: A | Discharge status: Home / Self Care | Payer: BC Managed Care – PPO | Source: Ambulatory Visit | Attending: Internal Medicine | Admitting: Internal Medicine

## 2022-09-10 ENCOUNTER — Telehealth: Payer: BC Managed Care – PPO | Admitting: Physician Assistant

## 2022-09-10 ENCOUNTER — Other Ambulatory Visit: Payer: Self-pay

## 2022-09-10 VITALS — BP 122/76 | HR 77 | Temp 98.9°F | Resp 18

## 2022-09-10 DIAGNOSIS — R1084 Generalized abdominal pain: Secondary | ICD-10-CM

## 2022-09-10 DIAGNOSIS — R1013 Epigastric pain: Secondary | ICD-10-CM

## 2022-09-10 DIAGNOSIS — T50902D Poisoning by unspecified drugs, medicaments and biological substances, intentional self-harm, subsequent encounter: Secondary | ICD-10-CM

## 2022-09-10 DIAGNOSIS — R110 Nausea: Secondary | ICD-10-CM | POA: Diagnosis not present

## 2022-09-10 DIAGNOSIS — T50902S Poisoning by unspecified drugs, medicaments and biological substances, intentional self-harm, sequela: Secondary | ICD-10-CM | POA: Diagnosis not present

## 2022-09-10 DIAGNOSIS — R339 Retention of urine, unspecified: Secondary | ICD-10-CM

## 2022-09-10 DIAGNOSIS — R34 Anuria and oliguria: Secondary | ICD-10-CM

## 2022-09-10 DIAGNOSIS — R1030 Lower abdominal pain, unspecified: Secondary | ICD-10-CM | POA: Diagnosis not present

## 2022-09-10 LAB — POCT URINALYSIS DIP (MANUAL ENTRY)
Bilirubin, UA: NEGATIVE
Blood, UA: NEGATIVE
Glucose, UA: NEGATIVE mg/dL
Ketones, POC UA: NEGATIVE mg/dL
Leukocytes, UA: NEGATIVE
Nitrite, UA: NEGATIVE
Protein Ur, POC: NEGATIVE mg/dL
Spec Grav, UA: 1.025 (ref 1.010–1.025)
Urobilinogen, UA: 0.2 E.U./dL
pH, UA: 7.5 (ref 5.0–8.0)

## 2022-09-10 LAB — POCT URINE PREGNANCY: Preg Test, Ur: NEGATIVE

## 2022-09-10 NOTE — ED Triage Notes (Signed)
Epigastric burning when not eating.  Aching from navel to lower abdomen.  When patient eats abdominal pain is worse. Has nausea, no vomiting  Friday attempted overdose and was seen at an ED[facility] in [place]

## 2022-09-10 NOTE — Discharge Instructions (Addendum)
Given your current symptoms and recent hospitalization, if is very important that you be evaluated as soon as possible at the ER.

## 2022-09-10 NOTE — ED Notes (Signed)
Patient is being discharged from the Urgent Care and sent to the Emergency Department via POV . Per [employer], PA, patient is in need of higher level of care due to limited resources. Patient is aware and verbalizes understanding of plan of care.  Vitals:   09/10/22 1752  BP: 122/76  Pulse: 77  Resp: 18  Temp: 98.9 F (37.2 C)  SpO2: 97%

## 2022-09-10 NOTE — ED Provider Notes (Signed)
[facility]  Note:  This document was prepared using Dragon voice recognition software and may include unintentional dictation errors.  MRN: [id number] DOB: Jan 22, 1996 DATE: 09/10/22   Subjective:  Chief Complaint:  Chief Complaint  Patient presents with   Abdominal Pain     HPI: Ashley [name] is a 27 y.o. female presenting for multiple complaints starting earlier this week. Patient states last week on 09/03/2022 she was seen in our office and diagnosed with cystitis, BV, candidiasis of the vagina, and gonorrhea.  Patient followed up on 09/05/2018 for  Rocephin injection at that time.  She reports she was able to tolerate the injection with no issues; however, she reports having a hard time with gonorrhea diagnosis. She states she is married and approached her husband about the issue. Following the diagnosis, she reports intentional overdose on 09/06/2022 which required hospitalization at [facility] at that time.  She reports taking Ibuprofen, Benadryl, and Unisom at that time.  When asked how much she took, she states that she is unsure because she was taking handfuls of the medications back-to-back.  She reports no suicidal ideations currently.  However, she has since developed ongoing symptoms since her overdose.  First, she reports urinary retention.  She states she has a difficult time urinating and is going less frequently.  She states she often still feels that her bladder is full when she is able to urinate.  She reports a fullness after urinating as well as lower abdominal pain.  She became concerned when this morning she started having intense low back pain as well.  She reports only urinating twice today.  She reports urinating once at 3 AM and then at her appointment today.  She does not feel the urge to urinate.  While in office she was able to urinate 60 mL on her own. Denies dysuria.  Second, patient endorses epigastric pain intermittently.  She reports a burning  sensation.  Food does not make the symptoms better or worse.  She reports some nausea but no vomiting.  She has also had difficulty with bowel movements.  Her last bowel movement was this weekend none this week as of yet.  She reports no blood in her stool. Denies fever. Endorses night sweats. Presents NAD.  Prior to Admission medications   Medication Sig Start Date End Date Taking? Authorizing Provider  fluconazole (DIFLUCAN) 150 MG tablet Take 1 tablet today.  Take second tablet 3 days later. Patient not taking: Reported on 09/10/2022 09/05/22   Theadora Rama Scales, PA-C  medroxyPROGESTERone (DEPO-PROVERA) 150 MG/ML injection Inject 1 mL (150 mg total) into the muscle every 3 (three) months. 06/25/22   Brock Bad, MD  metroNIDAZOLE (METROGEL) 0.75 % vaginal gel Insert vaginally using applicator nightly for 5 nights.  Abstain from sexual intercourse or tampon use until treatment is complete. 09/05/22   Theadora Rama Scales, PA-C  ondansetron (ZOFRAN-ODT) 4 MG disintegrating tablet Take by mouth. Patient not taking: Reported on 09/10/2022 07/20/17   [provider]  predniSONE (DELTASONE) 20 MG tablet Take 1 tablet (20 mg total) by mouth daily with breakfast. Patient not taking: Reported on 09/10/2022 06/14/22   Rodriguez-Southworth, Nettie Elm, PA-C  Prenat-FeFum-DSS-FA-DHA w/o A (PNV-DHA+DOCUSATE) 27-1.25-300 MG CAPS Take 1 capsule by mouth daily before breakfast. Patient not taking: Reported on 09/10/2022 06/25/22   Brock Bad, MD  pseudoephedrine (SUDAFED) 120 MG 12 hr tablet Take 1 tablet (120 mg total) by mouth 2 (two) times daily. Patient not taking: Reported on 09/10/2022  06/14/22   Rodriguez-Southworth, Nettie Elm, PA-C  rizatriptan (MAXALT) 5 MG tablet Take by mouth. Patient not taking: Reported on 09/10/2022 05/21/22   [provider]  SUMAtriptan (IMITREX) 25 MG tablet PLEASE SEE ATTACHED FOR DETAILED DIRECTIONS Patient not taking: Reported on 09/10/2022 03/26/22   [provider]  SYMBICORT 160-4.5 MCG/ACT inhaler SMARTSIG:2 Puff(s) By Mouth Twice Daily 07/17/21   [provider]  topiramate (TOPAMAX) 25 MG tablet Take 25 mg by mouth at bedtime. Patient not taking: Reported on 09/10/2022 03/26/22   [provider]  valACYclovir (VALTREX) 1000 MG tablet Take 1 tablet (1,000 mg total) by mouth 3 (three) times daily. Patient not taking: Reported on 09/10/2022 05/23/22   Wallis Bamberg, PA-C  valACYclovir (VALTREX) 500 MG tablet TAKE AS DIRECTED FOR 3 DAYS AS NEEDED FOR EACH OUTBREAK Patient not taking: Reported on 09/10/2022 08/06/22   Constant, Peggy, MD  VENTOLIN HFA 108 (90 Base) MCG/ACT inhaler SMARTSIG:1 Puff(s) By Mouth Every 4-6 Hours PRN 06/11/21   [provider]     Allergies  Allergen Reactions   Penicillins Anaphylaxis, Hives and Rash    Foamed from mouth Has patient had a PCN reaction causing immediate rash, facial/tongue/throat swelling, SOB or lightheadedness with hypotension: Yes Has patient had a PCN reaction causing severe rash involving mucus membranes or skin necrosis: No Has patient had a PCN reaction that required hospitalization No Has patient had a PCN reaction occurring within the last 10 years: No If all of the above answers are "NO", then may proceed with Cephalosporin use.     History:   Past Medical History:  Diagnosis Date   Concussion without loss of consciousness 12/02/2014     Past Surgical History:  Procedure Laterality Date   KNEE SURGERY Left 12/2019    Family History  Problem Relation Age of Onset   Diabetes Mother    Hypertension Mother    Diabetes Sister    Hypertension Sister     Social History   Tobacco Use   Smoking status: Never   Smokeless tobacco: Never  Vaping Use   Vaping status: Never Used  Substance Use Topics   Alcohol use: Not Currently    Comment: occ   Drug use: No    Review of Systems  Constitutional:  Positive for appetite change, diaphoresis and fatigue.  Negative for fever.  Gastrointestinal:  Positive for abdominal pain, constipation and nausea. Negative for blood in stool, diarrhea and vomiting.  Genitourinary:  Positive for decreased urine volume, difficulty urinating, flank pain and vaginal discharge. Negative for dysuria and frequency.  Musculoskeletal:  Positive for back pain.  Neurological:  Positive for light-headedness.  Psychiatric/Behavioral:  Negative for suicidal ideas.      Objective:   Vitals: BP 122/76 (BP Location: Right Arm)   Pulse 77   Temp 98.9 F (37.2 C) (Oral)   Resp 18   LMP 07/26/2022 (Approximate)   SpO2 97%   Physical Exam Constitutional:      General: She is not in acute distress.    Appearance: Normal appearance. She is well-developed and normal weight. She is not ill-appearing or toxic-appearing.  HENT:     Head: Normocephalic and atraumatic.  Cardiovascular:     Rate and Rhythm: Normal rate and regular rhythm.     Heart sounds: Normal heart sounds.  Pulmonary:     Effort: Pulmonary effort is normal.     Breath sounds: Normal breath sounds.     Comments: Clear to auscultation bilaterally  Abdominal:  General: Bowel sounds are normal.     Palpations: Abdomen is soft.     Tenderness: There is no abdominal tenderness. There is no right CVA tenderness or left CVA tenderness.  Skin:    General: Skin is warm and dry.  Neurological:     General: No focal deficit present.     Mental Status: She is alert.  Psychiatric:        Mood and Affect: Affect normal. Mood is depressed.        Thought Content: Thought content does not include suicidal ideation. Thought content does not include homicidal or suicidal plan.     Results:  Labs: Results for orders placed or performed during the hospital encounter of 09/10/22 (from the past 24 hour(s))  POCT urinalysis dipstick     Status: None   Collection Time: 09/10/22  6:09 PM  Result Value Ref Range   Color, UA yellow yellow   Clarity, UA clear  clear   Glucose, UA negative negative mg/dL   Bilirubin, UA negative negative   Ketones, POC UA negative negative mg/dL   Spec Grav, UA 4.034 7.425 - 1.025   Blood, UA negative negative   pH, UA 7.5 5.0 - 8.0   Protein Ur, POC negative negative mg/dL   Urobilinogen, UA 0.2 0.2 or 1.0 E.U./dL   Nitrite, UA Negative Negative   Leukocytes, UA Negative Negative  POCT urine pregnancy     Status: None   Collection Time: 09/10/22  6:09 PM  Result Value Ref Range   Preg Test, Ur Negative Negative    Radiology: No results found.   UC Course/Treatments:  Procedures: Procedures   Medications Ordered in UC: Medications - No data to display   Assessment and Plan :     ICD-10-CM   1. Urinary retention  R33.9     2. Epigastric pain  R10.13 POCT urinalysis dipstick    POCT urine pregnancy    POCT urinalysis dipstick    POCT urine pregnancy    CANCELED: CBC with Differential/Platelet    CANCELED: Comprehensive metabolic panel    CANCELED: Lipase    CANCELED: CBC with Differential/Platelet    CANCELED: Comprehensive metabolic panel    CANCELED: Lipase    3. Intentional overdose, sequela (HCC)  T50.902S      Urinary retention Afebrile, nontoxic-appearing, NAD. VSS.  Patient presented to urgent care complaining of urinary retention.  During exam, patient reported decreased urine output as well as difficulty urinating.  She reports only urinating twice today once at 3 AM and once at her appointment today for urine sample which was .  She also started developing lower abdominal pain and bilateral flank pain last night.  UA was unremarkable today in office and UPT was negative.  However, she does report intentional overdose approximately 4 days ago.  At that time she took Ibuprofen, Unisom, and Benadryl.  She is unable to determine how much she took of each medication.  Given recent overdose and urinary retention, it is recommend that patient go directly to the ER for further  evaluation/treatment/observation.  Suspect her symptoms are related to the medications that she overdosed on.  Concern for possible hydronephrosis versus AKI versus kidney failure. It is important she have STAT labs and imaging preformed at this time.   Patient is stable for discharge to the emergency department. she will be going to the ER via private vehicle driven by herself.  Epigastric pain Afebrile, nontoxic-appearing, NAD. VSS.  Patient presented to urgent  care complaining of epigastric pain.  During exam, patient reported recent overdose with Ibuprofen, Unisom, and Benadryl approximately 4 days ago.  She is unable to determine how much she took of each medication.  Given recent overdose and epigastric pain, it is recommend that patient go directly to the ER for further evaluation/treatment/observation.  Suspect her symptoms are related to the medications that she overdosed on.  Concern for possible gastric ulcer versus AKI. It is important she have STAT labs and imaging preformed at this time.   Patient is stable for discharge to the emergency department. she will be going to the ER via private vehicle driven by herself.  Intentional overdose, sequela (HCC) Previously diagnosed. Occurred on 09/06/2022. Believe her current symptoms are a result of the overdose. Given worsening of symptoms and medications involved, believe that STAT labs and imaging are needed to avoid further complications and mortality.   ED Discharge Orders     None        PDMP not reviewed this encounter.     Cynda Acres, PA-C 09/10/22 1855

## 2022-09-10 NOTE — Progress Notes (Signed)
Virtual Visit Consent   [place], you are scheduled for a virtual visit with a [facility] provider today. Just as with appointments in the office, your consent must be obtained to participate. Your consent will be active for this visit and any virtual visit you may have with one of our providers in the next 365 days. If you have a MyChart account, a copy of this consent can be sent to you electronically.  As this is a virtual visit, video technology does not allow for your provider to perform a traditional examination. This may limit your provider's ability to fully assess your condition. If your provider identifies any concerns that need to be evaluated in person or the need to arrange testing (such as labs, EKG, etc.), we will make arrangements to do so. Although advances in technology are sophisticated, we cannot ensure that it will always work on either your end or our end. If the connection with a video visit is poor, the visit may have to be switched to a telephone visit. With either a video or telephone visit, we are not always able to ensure that we have a secure connection.  By engaging in this virtual visit, you consent to the provision of healthcare and authorize for your insurance to be billed (if applicable) for the services provided during this visit. Depending on your insurance coverage, you may receive a charge related to this service.  I need to obtain your verbal consent now. Are you willing to proceed with your visit today? Ashley [name] has provided verbal consent on 09/10/2022 for a virtual visit (video or telephone). Piedad Climes, [place]  Date: 09/10/2022 12:35 PM  Virtual Visit via Video Note   I, Piedad Climes, connected with  Ashley [name]  ([id number], [birth year]) on 09/10/22 at 12:15 PM EDT by a video-enabled telemedicine application and verified that I am speaking with the correct person using two identifiers.  Location: Patient: Virtual Visit Location  Patient: Home Provider: Virtual Visit Location Provider: Home Office   I discussed the limitations of evaluation and management by telemedicine and the availability of in person appointments. The patient expressed understanding and agreed to proceed.    History of Present Illness: Ashley [name] is a 27 y.o. who identifies as a female who was assigned female at birth, and is being seen today for concerns of substantial mid abdominal pain, sometimes more generalized noted over past several days after being discharged from outside ER for intentional overdose of pain relievers, sleep medications and benadryl. Notes she was seen at an [place] ER -- discharged due to family issues. Was given IV fluids and told labs looked ok and to reutrn to ER for any recurring or worsening symptoms. Notes since then with Stomach pain and night sweats -- changing clothes multiple times. Mid abdomen, lower with nausea. No vomiting. Able to eat and hydrate.  No change to bowels. Notes oliguria. Denies hematuria.  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    Medications:  Current Outpatient Medications:    fluconazole (DIFLUCAN) 150 MG tablet,  Take 1 tablet today.  Take second tablet 3 days later., Disp: 2 tablet, Rfl: 0   medroxyPROGESTERone (DEPO-PROVERA) 150 MG/ML injection, Inject 1 mL (150 mg total) into the muscle every 3 (three) months., Disp: 1 mL, Rfl: 4   metroNIDAZOLE (METROGEL) 0.75 % vaginal gel, Insert vaginally using applicator nightly for 5 nights.  Abstain from sexual intercourse or tampon use until treatment is complete., Disp: 70 g, Rfl: 0   ondansetron (ZOFRAN-ODT) 4 MG disintegrating tablet, Take by mouth., Disp: , Rfl:    predniSONE (DELTASONE) 20 MG tablet, Take 1 tablet (20 mg total) by mouth daily with breakfast., Disp: 5 tablet, Rfl: 0   Prenat-FeFum-DSS-FA-DHA w/o A (PNV-DHA+DOCUSATE) 27-1.25-300 MG CAPS, Take 1 capsule by mouth daily before breakfast., Disp: 90 capsule, Rfl: 4   pseudoephedrine (SUDAFED) 120 MG 12 hr tablet, Take 1 tablet (120 mg total) by mouth 2 (two) times daily., Disp: 20 tablet, Rfl: 0   rizatriptan (MAXALT) 5 MG tablet, Take by mouth., Disp: , Rfl:    SUMAtriptan (IMITREX) 25 MG tablet, PLEASE SEE ATTACHED FOR DETAILED DIRECTIONS, Disp: , Rfl:    SYMBICORT 160-4.5 MCG/ACT inhaler, SMARTSIG:2 Puff(s) By Mouth Twice Daily, Disp: , Rfl:    topiramate (TOPAMAX) 25 MG tablet, Take 25 mg by mouth at bedtime., Disp: , Rfl:    valACYclovir (VALTREX) 1000 MG tablet, Take 1 tablet (1,000 mg total) by mouth 3 (three) times daily., Disp: 21 tablet, Rfl: 0   valACYclovir (VALTREX) 500 MG tablet, TAKE AS DIRECTED FOR 3 DAYS AS NEEDED FOR EACH OUTBREAK, Disp: 90 tablet, Rfl: 3   VENTOLIN HFA 108 (90 Base) MCG/ACT inhaler, SMARTSIG:1 Puff(s) By Mouth Every 4-6 Hours PRN, Disp: , Rfl:   Observations/Objective: Patient is well-developed, well-nourished in no acute distress.  Resting comfortably  at home.  Head is normocephalic, atraumatic.  No labored breathing.  Speech is clear and coherent with logical content.  Patient is alert and oriented at baseline.   Assessment and Plan: 1. Oliguria  2.  Generalized abdominal pain  3. Intentional overdose, subsequent encounter  Needs in person evaluation due to complexity of this issue and need to assess renal function and evaluation for gastritis/ulceration. She declines ER evaluation at present but agrees to be seen at [facility] near her work first so she can at least get an initial assessment. Strict ER precautions again reviewed.   Follow Up Instructions: I discussed the assessment and treatment plan with the patient. The patient was provided an opportunity to ask questions and all were answered. The patient agreed with the plan and demonstrated an understanding of the instructions.  A copy of instructions were sent to the patient via MyChart unless otherwise noted below.   The patient was advised to call back or seek an in-person evaluation if the symptoms worsen or if the condition fails to improve as anticipated.  Time:  I spent 10 minutes with the patient via telehealth technology discussing the above problems/concerns.    Piedad Climes, PA-C

## 2022-09-13 DIAGNOSIS — F321 Major depressive disorder, single episode, moderate: Secondary | ICD-10-CM | POA: Diagnosis not present

## 2022-09-18 ENCOUNTER — Ambulatory Visit
Admission: RE | Admit: 2022-09-18 | Discharge: 2022-09-18 | Disposition: A | Discharge status: Home / Self Care | Payer: BC Managed Care – PPO | Source: Ambulatory Visit | Attending: Internal Medicine | Admitting: Internal Medicine

## 2022-09-18 ENCOUNTER — Ambulatory Visit (INDEPENDENT_AMBULATORY_CARE_PROVIDER_SITE_OTHER): Payer: BC Managed Care – PPO

## 2022-09-18 VITALS — BP 115/71 | HR 70 | Temp 98.0°F | Resp 18

## 2022-09-18 DIAGNOSIS — Z09 Encounter for follow-up examination after completed treatment for conditions other than malignant neoplasm: Secondary | ICD-10-CM | POA: Insufficient documentation

## 2022-09-18 DIAGNOSIS — Z8619 Personal history of other infectious and parasitic diseases: Secondary | ICD-10-CM | POA: Insufficient documentation

## 2022-09-18 DIAGNOSIS — Z113 Encounter for screening for infections with a predominantly sexual mode of transmission: Secondary | ICD-10-CM | POA: Diagnosis not present

## 2022-09-18 DIAGNOSIS — F321 Major depressive disorder, single episode, moderate: Secondary | ICD-10-CM | POA: Diagnosis not present

## 2022-09-18 DIAGNOSIS — Z3042 Encounter for surveillance of injectable contraceptive: Secondary | ICD-10-CM

## 2022-09-18 MED ORDER — MEDROXYPROGESTERONE ACETATE 150 MG/ML IM SUSP
150.0000 mg | INTRAMUSCULAR | Status: DC
Start: 2022-09-18 — End: 2023-10-09
  Administered 2022-09-18: 150 mg via INTRAMUSCULAR

## 2022-09-18 NOTE — Progress Notes (Signed)
Pt is in the office for depo injection. Administered injection in [facility] and pt tolerated well. Next due Oct 30- Nov 13. .. Administrations This Visit     medroxyPROGESTERone (DEPO-PROVERA) injection 150 mg     Admin Date 09/18/2022 Action Given Dose 150 mg Route Intramuscular Documented By Katrina Stack, RN

## 2022-09-18 NOTE — Discharge Instructions (Signed)
Your swab was sent to the lab for further testing.  You will be called with results. You should avoid all sexual activity until you have been notified of all your results and have undergone any necessary treatment.  If you are positive, it is recommended that you inform all sexual partners so they can treat be treated as well before having sex again.   

## 2022-09-18 NOTE — ED Provider Notes (Signed)
[facility]  Note:  This document was prepared using Dragon voice recognition software and may include unintentional dictation errors.  MRN: [id number] DOB: [birth year] DATE: 09/18/22   Subjective:  Chief Complaint:  Chief Complaint  Patient presents with   SEXUALLY TRANSMITTED DISEASE    RETESR FROM [place] - Entered by patient    HPI: Ashley [name] is a 27 y.o. female presenting for STD testing. Patient was diagnosed with gonorrhea on 09/03/2022. She completed treatment on 09/05/2022 receiving 500mg  of Rocephin at that time. She is wanted retesting after treatment. Currently asymptomatic with  no known exposures. Denies fever, nausea/vomiting, vaginal discharge, dysuria. Presents NAD.  Prior to Admission medications   Medication Sig Start Date End Date Taking? Authorizing Provider  fluconazole (DIFLUCAN) 150 MG tablet Take 1 tablet today.  Take second tablet 3 days later. Patient not taking: Reported on 09/10/2022 09/05/22   Theadora Rama Scales, PA-C  medroxyPROGESTERone (DEPO-PROVERA) 150 MG/ML injection Inject 1 mL (150 mg total) into the muscle every 3 (three) months. 06/25/22   Brock Bad, MD  metroNIDAZOLE (METROGEL) 0.75 % vaginal gel Insert vaginally using applicator nightly for 5 nights.  Abstain from sexual intercourse or tampon use until treatment is complete. 09/05/22   Theadora Rama Scales, PA-C  ondansetron (ZOFRAN-ODT) 4 MG disintegrating tablet Take by mouth. Patient not taking: Reported on 09/10/2022 07/20/17   [provider]  predniSONE (DELTASONE) 20 MG tablet Take 1 tablet (20 mg total) by mouth daily with breakfast. Patient not taking: Reported on 09/10/2022 06/14/22   Rodriguez-Southworth, Nettie Elm, PA-C  Prenat-FeFum-DSS-FA-DHA w/o A (PNV-DHA+DOCUSATE) 27-1.25-300 MG CAPS Take 1 capsule by mouth daily before breakfast. Patient not taking: Reported on 09/10/2022 06/25/22   Brock Bad, MD  pseudoephedrine (SUDAFED) 120 MG 12 hr tablet Take 1 tablet (120  mg total) by mouth 2 (two) times daily. Patient not taking: Reported on 09/10/2022 06/14/22   Rodriguez-Southworth, Nettie Elm, PA-C  rizatriptan (MAXALT) 5 MG tablet Take by mouth. Patient not taking: Reported on 09/10/2022 05/21/22   [provider]  SUMAtriptan (IMITREX) 25 MG tablet PLEASE SEE ATTACHED FOR DETAILED DIRECTIONS Patient not taking: Reported on 09/10/2022 03/26/22   [provider]  SYMBICORT 160-4.5 MCG/ACT inhaler SMARTSIG:2 Puff(s) By Mouth Twice Daily 07/17/21   [provider]  topiramate (TOPAMAX) 25 MG tablet Take 25 mg by mouth at bedtime. Patient not taking: Reported on 09/10/2022 03/26/22   [provider]  valACYclovir (VALTREX) 1000 MG tablet Take 1 tablet (1,000 mg total) by mouth 3 (three) times daily. Patient not taking: Reported on 09/10/2022 05/23/22   Wallis Bamberg, PA-C  valACYclovir (VALTREX) 500 MG tablet TAKE AS DIRECTED FOR 3 DAYS AS NEEDED FOR EACH OUTBREAK Patient not taking: Reported on 09/10/2022 08/06/22   Constant, Peggy, MD  VENTOLIN HFA 108 (90 Base) MCG/ACT inhaler SMARTSIG:1 Puff(s) By Mouth Every 4-6 Hours PRN 06/11/21   [provider]     Allergies  Allergen Reactions   Penicillins Anaphylaxis, Hives and Rash    Foamed from mouth Has patient had a PCN reaction causing immediate rash, facial/tongue/throat swelling, SOB or lightheadedness with hypotension: Yes Has patient had a PCN reaction causing severe rash involving mucus membranes or skin necrosis: No Has patient had a PCN reaction that required hospitalization No Has patient had a PCN reaction occurring within the last 10 years: No If all of the above answers are "NO", then may proceed with Cephalosporin use.     History:   Past Medical History:  Diagnosis Date   Concussion without loss of consciousness 12/02/2014     Past Surgical History:  Procedure Laterality Date   KNEE SURGERY Left 12/2019    Family History  Problem Relation Age of Onset    Diabetes Mother    Hypertension Mother    Diabetes Sister    Hypertension Sister     Social History   Tobacco Use   Smoking status: Never   Smokeless tobacco: Never  Vaping Use   Vaping status: Never Used  Substance Use Topics   Alcohol use: Not Currently    Comment: occ   Drug use: No    Review of Systems  Constitutional:  Negative for fever.  Gastrointestinal:  Negative for abdominal pain, nausea and vomiting.  Genitourinary:  Negative for dysuria and vaginal discharge.     Objective:   Vitals: BP 115/71 (BP Location: Right Arm)   Pulse 70   Temp 98 F (36.7 C) (Oral)   Resp 18   LMP 07/26/2022 (Approximate) Comment: spotting since period d/t Depo  SpO2 98%   Physical Exam Constitutional:      General: She is not in acute distress.    Appearance: Normal appearance. She is well-developed and normal weight. She is not ill-appearing or toxic-appearing.  HENT:     Head: Normocephalic and atraumatic.  Cardiovascular:     Rate and Rhythm: Normal rate and regular rhythm.     Heart sounds: Normal heart sounds.  Pulmonary:     Effort: Pulmonary effort is normal.     Breath sounds: Normal breath sounds.     Comments: Clear to auscultation bilaterally  Abdominal:     General: Bowel sounds are normal.     Palpations: Abdomen is soft.     Tenderness: There is no abdominal tenderness.  Skin:    General: Skin is warm and dry.  Neurological:     General: No focal deficit present.     Mental Status: She is alert.  Psychiatric:        Mood and Affect: Mood and affect normal.     Results:  Labs: No results found for this or any previous visit (from the past 24 hour(s)).  Radiology: No results found.   UC Course/Treatments:  Procedures: Procedures   Medications Ordered in UC: Medications - No data to display   Assessment and Plan :     ICD-10-CM   1. Screening for STDs (sexually transmitted diseases)  Z11.3      Screening for STDs (sexually  transmitted diseases) Afebrile, nontoxic-appearing, NAD. VSS. Asymptomatic Previously tested positive for gonorrhea on 09/03/2022 and completed treatment on 09/05/2022. Wanting to confirm that treatment was successful. Cytology is pending. Safe sex precautions advised. Strict ED precautions were given and patient verbalized understanding.  ED Discharge Orders     None        PDMP not reviewed this encounter.     Cynda Acres, PA-C 09/18/22 1756

## 2022-09-18 NOTE — ED Triage Notes (Signed)
Re-testing after treatment for STD. No sxs or known exposure.

## 2022-09-19 ENCOUNTER — Encounter: Payer: Self-pay | Admitting: Obstetrics

## 2022-09-20 LAB — CERVICOVAGINAL ANCILLARY ONLY
Bacterial Vaginitis (gardnerella): POSITIVE — AB
Candida Glabrata: NEGATIVE
Candida Vaginitis: NEGATIVE
Chlamydia: NEGATIVE
Comment: NEGATIVE
Comment: NEGATIVE
Comment: NEGATIVE
Comment: NEGATIVE
Comment: NEGATIVE
Comment: NORMAL
Neisseria Gonorrhea: NEGATIVE
Trichomonas: NEGATIVE

## 2022-09-25 DIAGNOSIS — F321 Major depressive disorder, single episode, moderate: Secondary | ICD-10-CM | POA: Diagnosis not present

## 2022-10-01 ENCOUNTER — Telehealth: Payer: BC Managed Care – PPO | Admitting: Physician Assistant

## 2022-10-01 ENCOUNTER — Telehealth: Payer: BC Managed Care – PPO | Admitting: Family Medicine

## 2022-10-01 DIAGNOSIS — S0990XA Unspecified injury of head, initial encounter: Secondary | ICD-10-CM

## 2022-10-01 DIAGNOSIS — R112 Nausea with vomiting, unspecified: Secondary | ICD-10-CM

## 2022-10-01 DIAGNOSIS — R58 Hemorrhage, not elsewhere classified: Secondary | ICD-10-CM

## 2022-10-01 NOTE — Progress Notes (Signed)
Because of nausea and vomiting after a reported injury, I feel your condition warrants further evaluation and I recommend that you be seen in a face to face visit.   NOTE: There will be NO CHARGE for this eVisit   If you are having a true medical emergency please call 911.      For an urgent face to face visit, [facility] has eight urgent care centers for your convenience:   NEW!! [facility] at [facility] Get Driving Directions [phone] [address], [address] [place], [address]    [facility] at [facility] Get Driving Directions [phone] [address] [place], [place] [address]   [facility]) Get Driving Directions [phone] [address] [place], [place] [address]  [facility]) Get Driving Directions [phone] [address] [place],  [place]  [address]  [facility] - at [employer]  [phone] [address] [place],  [place] [address]   [facility] at [facility] Get Driving Directions [phone] [address] [place], [place] [address]   [facility] Urgent Care at [facility] Get Driving Directions  [phone] [address] [place], [place] [address]   [facility] Urgent Care at [facility] Directions [phone] [address] [place], [place] [address]  Your MyChart E-visit questionnaire answers were reviewed by a board certified advanced clinical practitioner to complete your personal care plan based on your specific symptoms.  Thank you for using e-Visits.

## 2022-10-01 NOTE — Progress Notes (Signed)
Because recent treatment for nausea and vomiting in July, in addition your first EV mentioned head or abdominal injury and bleeding since depo injection, I feel your condition warrants further evaluation and I recommend that you be seen in a face to face visit.   NOTE: There will be NO CHARGE for this eVisit   If you are having a true medical emergency please call 911.

## 2022-10-02 DIAGNOSIS — R110 Nausea: Secondary | ICD-10-CM | POA: Diagnosis not present

## 2022-10-02 DIAGNOSIS — G43909 Migraine, unspecified, not intractable, without status migrainosus: Secondary | ICD-10-CM | POA: Diagnosis not present

## 2022-10-02 DIAGNOSIS — R519 Headache, unspecified: Secondary | ICD-10-CM | POA: Diagnosis not present

## 2022-10-03 DIAGNOSIS — F321 Major depressive disorder, single episode, moderate: Secondary | ICD-10-CM | POA: Diagnosis not present

## 2022-10-11 DIAGNOSIS — F321 Major depressive disorder, single episode, moderate: Secondary | ICD-10-CM | POA: Diagnosis not present

## 2022-10-22 ENCOUNTER — Ambulatory Visit
Admission: RE | Admit: 2022-10-22 | Discharge: 2022-10-22 | Disposition: A | Discharge status: Home / Self Care | Payer: BC Managed Care – PPO | Source: Ambulatory Visit | Attending: Emergency Medicine | Admitting: Emergency Medicine

## 2022-10-22 ENCOUNTER — Other Ambulatory Visit: Payer: Self-pay

## 2022-10-22 VITALS — BP 123/83 | HR 70 | Temp 98.9°F | Resp 16

## 2022-10-22 DIAGNOSIS — N760 Acute vaginitis: Secondary | ICD-10-CM | POA: Insufficient documentation

## 2022-10-22 DIAGNOSIS — Z113 Encounter for screening for infections with a predominantly sexual mode of transmission: Secondary | ICD-10-CM | POA: Diagnosis not present

## 2022-10-22 DIAGNOSIS — B9689 Other specified bacterial agents as the cause of diseases classified elsewhere: Secondary | ICD-10-CM | POA: Insufficient documentation

## 2022-10-22 NOTE — ED Triage Notes (Addendum)
Pt here for STD testing, no sxs. Pt states she was treated for Gonorrhea approximately 6 weeks ago.

## 2022-10-22 NOTE — ED Provider Notes (Signed)
[name]    CSN: [id number] Arrival date & time: 10/22/22  1153      History   Chief Complaint Chief Complaint  Patient presents with   SEXUALLY TRANSMITTED DISEASE    Just need a follow-up test - Entered by patient    HPI Ashley [name] is a 27 y.o. female. She has no symptoms at this time. Is requesting STI treatment including HIV and RPR. Was tested at the end of July 2024 and was positive for gonorrhea, yeast, and BV. F/u testing in August, she was positive only for BV. Denies behavior that would put her at risk for reinfection, but wants to make sure she is STI and vaginal infection-free.   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 Authorizing Provider  fluconazole (DIFLUCAN) 150 MG tablet Take 1 tablet today.  Take second tablet 3 days later. Patient not taking: Reported on 09/10/2022 09/05/22   Theadora Rama Scales, PA-C  medroxyPROGESTERone (DEPO-PROVERA) 150 MG/ML injection Inject 1 mL (150 mg total) into the muscle every 3 (three) months. 06/25/22   Brock Bad, MD  metroNIDAZOLE (METROGEL) 0.75 % vaginal gel Insert vaginally using applicator nightly for 5 nights.  Abstain from sexual intercourse or tampon use until treatment is complete. 09/05/22   Theadora Rama Scales, PA-C   ondansetron (ZOFRAN-ODT) 4 MG disintegrating tablet Take by mouth. Patient not taking: Reported on 09/10/2022 07/20/17   [provider]  predniSONE (DELTASONE) 20 MG tablet Take 1 tablet (20 mg total) by mouth daily with breakfast. Patient not taking: Reported on 09/10/2022 06/14/22   Rodriguez-Southworth, Nettie Elm, PA-C  Prenat-FeFum-DSS-FA-DHA w/o A (PNV-DHA+DOCUSATE) 27-1.25-300 MG CAPS Take 1 capsule by mouth daily before breakfast. Patient not taking: Reported on 09/10/2022 06/25/22   Brock Bad, MD  pseudoephedrine (SUDAFED) 120 MG 12 hr tablet Take 1 tablet (120 mg total) by mouth 2 (two) times daily. Patient not taking: Reported on 09/10/2022 06/14/22   Rodriguez-Southworth, Nettie Elm, PA-C  rizatriptan (MAXALT) 5 MG tablet Take by mouth. Patient not taking: Reported on 09/10/2022 05/21/22   [provider]  SUMAtriptan (IMITREX) 25 MG tablet PLEASE SEE ATTACHED FOR DETAILED DIRECTIONS Patient not taking: Reported on 09/10/2022 03/26/22   [provider]  SYMBICORT 160-4.5 MCG/ACT inhaler SMARTSIG:2 Puff(s) By Mouth Twice Daily 07/17/21   [provider]  topiramate (TOPAMAX) 25 MG tablet Take 25 mg by mouth at bedtime. Patient not taking: Reported on 09/10/2022 03/26/22   [provider]  valACYclovir (VALTREX) 1000 MG tablet Take 1 tablet (1,000 mg  total) by mouth 3 (three) times daily. Patient not taking: Reported on 09/10/2022 05/23/22   Wallis Bamberg, PA-C  valACYclovir (VALTREX) 500 MG tablet TAKE AS DIRECTED FOR 3 DAYS AS NEEDED FOR EACH OUTBREAK Patient not taking: Reported on 09/10/2022 08/06/22   Constant, Peggy, MD  VENTOLIN HFA 108 (90 Base) MCG/ACT inhaler SMARTSIG:1 Puff(s) By Mouth Every 4-6 Hours PRN 06/11/21   [provider]    Family History Family History  Problem Relation Age of Onset   Diabetes Mother    Hypertension Mother    Diabetes Sister    Hypertension Sister     Social History Social History   Tobacco Use   Smoking  status: Never   Smokeless tobacco: Never  Vaping Use   Vaping status: Never Used  Substance Use Topics   Alcohol use: Not Currently    Comment: occ   Drug use: No     Allergies   Penicillins   Review of Systems Review of Systems   Physical Exam Triage Vital Signs ED Triage Vitals  Encounter Vitals Group     BP 10/22/22 1200 123/83     Systolic BP Percentile --      Diastolic BP Percentile --      Pulse Rate 10/22/22 1200 70     Resp 10/22/22 1200 16     Temp 10/22/22 1200 98.9 F (37.2 C)     Temp Source 10/22/22 1200 Oral     SpO2 10/22/22 1200 98 %     Weight --      Height --      Head Circumference --      Peak Flow --      Pain Score 10/22/22 1208 0     Pain Loc --      Pain Education --      Exclude from Growth Chart --    No data found.  Updated Vital Signs BP 123/83 (BP Location: Right Arm)   Pulse 70   Temp 98.9 F (37.2 C) (Oral)   Resp 16   SpO2 98%   Visual Acuity Right Eye Distance:   Left Eye Distance:   Bilateral Distance:    Right Eye Near:   Left Eye Near:    Bilateral Near:     Physical Exam Constitutional:      Appearance: Normal appearance.  Pulmonary:     Effort: Pulmonary effort is normal.  Neurological:     Mental Status: She is alert.  Psychiatric:        Mood and Affect: Mood normal.        Behavior: Behavior normal.        Judgment: Judgment normal.      UC Treatments / Results  Labs (all labs ordered are listed, but only abnormal results are displayed) Labs Reviewed  HIV ANTIBODY (ROUTINE TESTING W REFLEX)  RPR  CERVICOVAGINAL ANCILLARY ONLY    EKG   Radiology No results found.  Procedures Procedures (including critical care time)  Medications Ordered in UC Medications - No data to display  Initial Impression / Assessment and Plan / UC Course  I have reviewed the triage vital signs and the nursing notes.  Pertinent labs & imaging results that were available during my care of the patient were  reviewed by me and considered in my medical decision making (see chart for details).    Testing obtained, as requested. Results pending  Final Clinical Impressions(s) / UC Diagnoses   Final diagnoses:  Screening examination  for STI     Discharge Instructions      You will get a call if tests are positive, you will not get a call if tests are negative but you can check results in MyChart if you have a MyChart account.     ED Prescriptions   None    PDMP not reviewed this encounter.   Cathlyn Parsons, NP 10/22/22 1257

## 2022-10-22 NOTE — Discharge Instructions (Signed)
You will get a call if tests are positive, you will not get a call if tests are negative but you can check results in MyChart if you have a MyChart account.

## 2022-10-23 ENCOUNTER — Telehealth: Payer: BC Managed Care – PPO | Admitting: Physician Assistant

## 2022-10-23 DIAGNOSIS — K219 Gastro-esophageal reflux disease without esophagitis: Secondary | ICD-10-CM | POA: Diagnosis not present

## 2022-10-23 MED ORDER — OMEPRAZOLE 20 MG PO CPDR
20.0000 mg | DELAYED_RELEASE_CAPSULE | Freq: Every day | ORAL | 0 refills | Status: DC
Start: 2022-10-23 — End: 2022-12-30

## 2022-10-23 NOTE — Progress Notes (Signed)
I have spent 5 minutes in review of e-visit questionnaire, review and updating patient chart, medical decision making and response to patient.   Mia Milan Cody Jacklynn Dehaas, PA-C    

## 2022-10-23 NOTE — Progress Notes (Signed)
E-Visit for Heartburn  We are sorry that you are not feeling well.  Here is how we plan to help!  Based on what you shared with me it looks like you most likely have Gastroesophageal Reflux Disease (GERD)  Gastroesophageal reflux disease (GERD) happens when acid from your stomach flows up into the esophagus.  When acid comes in contact with the esophagus, the acid causes sorenss (inflammation) in the esophagus.  Over time, GERD may create small holes (ulcers) in the lining of the esophagus.  I have prescribed Omeprazole 20 mg one by mouth daily until you follow up with a provider.  Your symptoms should improve in the next day or two.  You can use antacids as needed until symptoms resolve.  Call us if your heartburn worsens, you have trouble swallowing, weight loss, spitting up blood or recurrent vomiting.  Home Care: May include lifestyle changes such as weight loss, quitting smoking and alcohol consumption Avoid foods and drinks that make your symptoms worse, such as: Caffeine or alcoholic drinks Chocolate Peppermint or mint flavorings Garlic and onions Spicy foods Citrus fruits, such as oranges, lemons, or limes Tomato-based foods such as sauce, chili, salsa and pizza Fried and fatty foods Avoid lying down for 3 hours prior to your bedtime or prior to taking a nap Eat small, frequent meals instead of a large meals Wear loose-fitting clothing.  Do not wear anything tight around your waist that causes pressure on your stomach. Raise the head of your bed 6 to 8 inches with wood blocks to help you sleep.  Extra pillows will not help.  Seek Help Right Away If: You have pain in your arms, neck, jaw, teeth or back Your pain increases or changes in intensity or duration You develop nausea, vomiting or sweating (diaphoresis) You develop shortness of breath or you faint Your vomit is green, yellow, black or looks like coffee grounds or blood Your stool is red, bloody or black  These  symptoms could be signs of other problems, such as heart disease, gastric bleeding or esophageal bleeding.  Make sure you : Understand these instructions. Will watch your condition. Will get help right away if you are not doing well or get worse.  Your e-visit answers were reviewed by a board certified advanced clinical practitioner to complete your personal care plan.  Depending on the condition, your plan could have included both over the counter or prescription medications.  If there is a problem please reply  once you have received a response from your provider.  Your safety is important to [place].  If you have drug allergies check your prescription carefully.    You can use MyChart to ask questions about todays visit, request a non-urgent call back, or ask for a work or school excuse for 24 hours related to this e-Visit. If it has been greater than 24 hours you will need to follow up with your provider, or enter a new e-Visit to address those concerns.  You will get an e-mail in the next two days asking about your experience.  I hope that your e-visit has been valuable and will speed your recovery. Thank you for using e-visits.

## 2022-10-24 ENCOUNTER — Telehealth: Payer: BC Managed Care – PPO

## 2022-10-24 ENCOUNTER — Telehealth: Payer: Self-pay

## 2022-10-24 MED ORDER — METRONIDAZOLE 500 MG PO TABS: 500.0000 mg | ORAL_TABLET | Freq: Two times a day (BID) | ORAL | 0 refills | Status: DC

## 2022-11-12 ENCOUNTER — Telehealth: Payer: Self-pay | Admitting: Emergency Medicine

## 2022-11-12 ENCOUNTER — Ambulatory Visit
Admission: RE | Admit: 2022-11-12 | Discharge: 2022-11-12 | Disposition: A | Discharge status: Home / Self Care | Payer: BC Managed Care – PPO | Source: Ambulatory Visit | Attending: Internal Medicine | Admitting: Internal Medicine

## 2022-11-12 VITALS — BP 123/86 | HR 74 | Temp 98.4°F | Resp 18

## 2022-11-12 DIAGNOSIS — R102 Pelvic and perineal pain: Secondary | ICD-10-CM | POA: Diagnosis not present

## 2022-11-12 DIAGNOSIS — Z87440 Personal history of urinary (tract) infections: Secondary | ICD-10-CM | POA: Diagnosis not present

## 2022-11-12 DIAGNOSIS — R110 Nausea: Secondary | ICD-10-CM | POA: Insufficient documentation

## 2022-11-12 DIAGNOSIS — M62830 Muscle spasm of back: Secondary | ICD-10-CM

## 2022-11-12 DIAGNOSIS — Z113 Encounter for screening for infections with a predominantly sexual mode of transmission: Secondary | ICD-10-CM | POA: Insufficient documentation

## 2022-11-12 LAB — POCT URINALYSIS DIP (MANUAL ENTRY)
Bilirubin, UA: NEGATIVE
Glucose, UA: NEGATIVE mg/dL
Ketones, POC UA: NEGATIVE mg/dL
Leukocytes, UA: NEGATIVE
Nitrite, UA: NEGATIVE
Protein Ur, POC: NEGATIVE mg/dL
Spec Grav, UA: 1.015 (ref 1.010–1.025)
Urobilinogen, UA: 0.2 U/dL
pH, UA: 7 (ref 5.0–8.0)

## 2022-11-12 MED ORDER — CIPROFLOXACIN HCL 250 MG PO TABS
250.0000 mg | ORAL_TABLET | Freq: Two times a day (BID) | ORAL | 0 refills | Status: AC
Start: 2022-11-12 — End: 2022-11-17

## 2022-11-12 MED ORDER — BACLOFEN 10 MG PO TABS: 10.0000 mg | ORAL_TABLET | Freq: Three times a day (TID) | ORAL | 0 refills | Status: AC

## 2022-11-12 MED ORDER — IBUPROFEN 600 MG PO TABS
600.0000 mg | ORAL_TABLET | Freq: Three times a day (TID) | ORAL | 0 refills | Status: DC | PRN
Start: 2022-11-12 — End: 2023-08-18

## 2022-11-12 NOTE — Discharge Instructions (Signed)
Common causes of urinary tract infections include but are not limited to holding your urine longer than you should, squatting instead of sitting down when urinating, sitting around in wet clothing such as a wet swimsuit or gym clothes too long, not emptying your bladder after having sexual intercourse, wiping from back to front instead of front to back after having a bowel movement.     The urinalysis that we performed in the clinic today was abnormal.  Urine culture will be performed per our protocol.  The result of the urine culture will be available in the next 3 to 5 days and will be posted to your MyChart account.  If there is an abnormal finding, you will be contacted by phone and advised of further treatment recommendations or adjustments, if any.   You were advised to begin antibiotics today because your urinalysis is abnormal and you are having active symptoms of an acute lower urinary tract infection also known as cystitis.     Please pick up and begin taking your prescription for Ciprofloxacin 250 mg as soon as possible.  Please take all doses exactly as prescribed.  You can take this medication with or without food.  This medication is safe to take with your other medications.  The results of your vaginal swab test which screens for BV, yeast, gonorrhea, chlamydia and trichomonas will be made posted to your MyChart account once it is complete.  This typically takes 2 to 4 days.  Please abstain from sexual intercourse of any kind, vaginal, oral or anal, until you have received the results of your STD testing.      If any of your results are abnormal, you will receive a phone call regarding treatment.  Prescriptions, if any are needed, will be provided for you at your pharmacy.      The mainstay of therapy for musculoskeletal pain is reduction of inflammation and relaxation of tension which is causing inflammation.  Keep in mind, pain always begets more pain.  To help you stay ahead of your  pain and inflammation, I have provided the following regimen for you:   When you pick up your prescription from the pharmacy, please begin taking baclofen 10 mg.  This is a highly effective muscle relaxer and antispasmodic which should continue to provide you with relaxation of your tense muscles, allow you to sleep well and to keep your pain under control.   When you pick up your prescription from the pharmacy, please begin taking ibuprofen 600 mg 3 times daily.  Please keep in mind that it is always easier to treat a little bit of pain that is to treat a lot of pain.  I recommend that for the next several days, you take this medication on a scheduled basis.  After that, take it when you begin to feel the pain returning, do not wait until you are in a lot of pain.   During the day, please set aside time to apply ice to the affected area 4 times daily for 20 minutes each application.  This can be achieved by using a bag of frozen peas or corn, a Ziploc bag filled with ice and water, or Ziploc bag filled with half rubbing alcohol and half Dawn dish detergent, frozen into a slush.  Please be careful not to apply ice directly to your skin, always place a soft cloth between you and the ice pack.  Over-the-counter products such as IcyHot and Biofreeze do not work nearly as well.  Please avoid attempts to stretch or strengthen the affected area until you are feeling completely pain-free.  Attempts to do so will only prolong the healing process.   Please consider discussing referral to physical therapy with your primary care provider.  Physical therapist are very good at teasing out the underlying cause of acute musculoskeletal pain and helping with prevention of future recurrences.   I also recommend that you remain out of work for the next few days, I provided you with a note to return to work in 3 days.  If you feel that you need this time extended, please follow-up with your primary care provider or return  to urgent care for reevaluation so that we can provide you with a note for another 3 days.   Thank you for visiting [facility] today.  We appreciate the opportunity to participate in your care.

## 2022-11-12 NOTE — Telephone Encounter (Signed)
[place] encounter

## 2022-11-12 NOTE — ED Provider Notes (Signed)
[name]    CSN: [id number] Arrival date & time: 11/12/22  1202    HISTORY   Chief Complaint  Patient presents with   Abdominal Pain    Entered by patient   Back Pain   HPI Ashley [name] is a pleasant, 27 y.o. female who presents to urgent care today. Patient complains of suprapubic pain and pressure which she states is intermittent and rates 5 out of 10 on pain scale.  Patient endorses nausea without vomiting which she also states has been intermittent.  Patient also complains of bilateral lower back pain that began yesterday.  Patient states currently using Depo-Provera for birth control, last injection 2 months ago. Patient states they have been sexually active within the last 6 weeks. Patient states that they sometimes use condoms when having sexual intercourse.   Patient denies abnormal vaginal discharge, vaginal itching, vaginal irritation, dyspareunia, genital lesion, burning during urination, increased frequency of urination, increased urge to urinate, sensation of incomplete emptying, fever , body aches, chills, rigors, malaise, and significant fatigue.  She took Motrin with some relief, states she took Midol without relief.  Patient states she has had ovarian cysts in the past and states that the pain she is experiencing at this time is more familiar to that when she had ovarian cysts that it is to when she has had a urinary tract infection.  Patient states that urinating relieves her symptoms.  Patient denies constipation, diarrhea, vomiting.  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 Authorizing Provider  baclofen (LIORESAL) 10 MG tablet Take 1 tablet (10 mg total) by mouth 3 (three) times daily for 7 days. 11/12/22 11/19/22 Yes Theadora Rama Scales, PA-C  ciprofloxacin (CIPRO) 250 MG tablet Take 1 tablet (250 mg total) by mouth 2 (two) times daily for 5 days. 11/12/22 11/17/22 Yes Theadora Rama Scales, PA-C  ibuprofen (ADVIL) 600 MG tablet Take 1 tablet (600 mg total) by mouth every 8 (eight) hours as needed for up to 30 doses for fever, headache, mild pain or moderate pain (Inflammation). Take 1 tablet 3 times daily as needed for inflammation of upper airways and/or pain. 11/12/22  Yes Theadora Rama Scales, PA-C  medroxyPROGESTERone (DEPO-PROVERA) 150 MG/ML injection Inject 1 mL (150 mg total) into the muscle every 3 (three) months. 06/25/22   Brock Bad, MD  omeprazole (PRILOSEC) 20 MG capsule Take 1 capsule (20 mg total) by mouth daily. 10/23/22   Waldon Merl, PA-C  SYMBICORT 160-4.5 MCG/ACT inhaler SMARTSIG:2 Puff(s) By Mouth Twice Daily 07/17/21   [provider]  valACYclovir (VALTREX) 500 MG tablet TAKE AS DIRECTED FOR 3 DAYS AS NEEDED FOR EACH OUTBREAK Patient not taking: Reported on 09/10/2022 08/06/22   Constant, Peggy, MD  VENTOLIN HFA 108 (90 Base) MCG/ACT inhaler SMARTSIG:1 Puff(s) By Mouth Every 4-6 Hours PRN 06/11/21   [provider]    Family History Family History  Problem Relation Age of Onset   Diabetes Mother    Hypertension Mother    Diabetes Sister    Hypertension Sister    Social History Social History   Tobacco Use   Smoking status: Never   Smokeless tobacco: Never  Vaping Use   Vaping status: Never Used  Substance Use Topics   Alcohol  use: Not Currently    Comment: occ   Drug use: No   Allergies   Penicillins  Review of Systems Review of Systems Pertinent findings revealed after performing a 14 point review of systems has been noted in the history of present illness.  Physical Exam Vital Signs BP 123/86 (BP Location: Right Arm)   Pulse 74   Temp 98.4 F (36.9 C) (Oral)   Resp 18   SpO2 98%   No data found.  Physical Exam Vitals and nursing note reviewed.  Constitutional:      General: She is not in acute distress.    Appearance: Normal appearance. She is not ill-appearing.  HENT:     Head: Normocephalic and atraumatic.  Eyes:     General: Lids are normal.        Right eye: No discharge.        Left eye: No discharge.     Extraocular Movements: Extraocular movements intact.     Conjunctiva/sclera: Conjunctivae normal.     Right eye: Right conjunctiva is not injected.     Left eye: Left conjunctiva is not injected.  Neck:     Trachea: Trachea and phonation normal.  Cardiovascular:     Rate and Rhythm: Normal rate and regular rhythm.     Pulses: Normal pulses.     Heart sounds: Normal heart sounds. No murmur heard.    No friction rub. No gallop.  Pulmonary:     Effort: Pulmonary effort is normal. No accessory muscle usage, prolonged expiration or respiratory distress.     Breath sounds: Normal breath sounds. No stridor, decreased air movement or transmitted upper airway sounds. No decreased breath sounds, wheezing, rhonchi or rales.  Chest:     Chest wall: No tenderness.  Abdominal:     General: Abdomen is flat. Bowel sounds are normal. There is no distension.     Palpations: Abdomen is soft.     Tenderness: There is abdominal tenderness in the suprapubic area. There is no right CVA tenderness or left CVA tenderness.     Hernia: No hernia is present.  Genitourinary:    Comments: Patient politely declines pelvic exam today, patient provided a vaginal swab for testing. Musculoskeletal:         General: Normal range of motion.     Cervical back: Normal range of motion and neck supple. Normal range of motion.     Lumbar back: Spasms and tenderness present.       Back:  Lymphadenopathy:     Cervical: No cervical adenopathy.  Skin:    General: Skin is warm and dry.     Findings: No erythema or rash.  Neurological:     General: No focal deficit present.     Mental Status: She is alert and oriented  to person, place, and time.  Psychiatric:        Mood and Affect: Mood normal.        Behavior: Behavior normal.     Visual Acuity Right Eye Distance:   Left Eye Distance:   Bilateral Distance:    Right Eye Near:   Left Eye Near:    Bilateral Near:     UC Couse / Diagnostics / Procedures:     Radiology No results found.  Procedures Procedures (including critical care time) EKG  Pending results:  Labs Reviewed  POCT URINALYSIS DIP (MANUAL ENTRY) - Abnormal; Notable for the following components:      Result Value   Blood, UA trace-intact (*)    All other components within normal limits  URINE CULTURE  POCT URINE PREGNANCY  CERVICOVAGINAL ANCILLARY ONLY    Medications Ordered in UC: Medications - No data to display  UC Diagnoses / Final Clinical Impressions(s)   I have reviewed the triage vital signs and the nursing notes.  Pertinent labs & imaging results that were available during my care of the patient were reviewed by me and considered in my medical decision making (see chart for details).    Final diagnoses:  Abdominal pain, suprapubic  Spasm of lumbar paraspinous muscle   STD screening was performed, patient advised that the results be posted to their MyChart and if any of the results are positive, they will be notified by phone, further treatment will be provided as indicated based on results of STD screening. Patient was advised to abstain from sexual intercourse until that they receive the results of their STD testing.  Patient was also advised to  use condoms to protect themselves from STD exposure. Urine pregnancy test was negative.  Urine dip today revealed microscopic hematuria.   Patient was advised to begin antibiotics now due to findings on urine dip. Patient was advised to begin antibiotics today due to having suprapubic pain.                    Patient was advised to take all doses exactly as prescribed.  Patient also advised of risks of worsening infection with incomplete antibiotic therapy. Patient advised that they will be contacted with results and that adjustments to treatment will be provided as indicated based on the results.   Patient was advised of possibility that urine culture results may be negative if sample provided was obtained late in the day causing urine to be more diluted.  Patient was advised that if antibiotics were effective after the first 24 to 36 hours, despite negative urine culture result, it is recommended that they complete the full course as prescribed.    For treatment of patient's bilateral lower back pain with muscle spasm, patient provided with ibuprofen 600 mg 3 times daily as needed and baclofen 10 mg 3 times daily as needed.  Conservative care recommended.  Return precautions advised.  Please see discharge instructions below for details of plan of care as provided to patient. ED Prescriptions     Medication Sig Dispense Auth. Provider   baclofen (LIORESAL) 10 MG tablet Take 1 tablet (10 mg total) by mouth 3 (three) times daily for 7 days. 21 tablet Theadora Rama Scales, PA-C   ibuprofen (ADVIL) 600 MG tablet Take 1 tablet (600 mg total) by mouth every 8 (eight) hours as needed for up to 30 doses for fever, headache, mild pain or moderate pain (Inflammation). Take 1 tablet 3 times daily as needed  for inflammation of upper airways and/or pain. 30 tablet Theadora Rama Scales, PA-C   ciprofloxacin (CIPRO) 250 MG tablet Take 1 tablet (250 mg total) by mouth 2 (two) times daily for 5 days. 10  tablet Theadora Rama Scales, PA-C      PDMP not reviewed this encounter.  Disposition Upon Discharge:  Condition: stable for discharge home  Patient presented with concern for an acute illness with associated systemic symptoms and significant discomfort requiring urgent management. In my opinion, this is a condition that a prudent lay person (someone who possesses an average knowledge of health and medicine) may potentially expect to result in complications if not addressed urgently such as respiratory distress, impairment of bodily function or dysfunction of bodily organs.   As such, the patient has been evaluated and assessed, work-up was performed and treatment was provided in alignment with urgent care protocols and evidence based medicine.  Patient/parent/caregiver has been advised that the patient may require follow up for further testing and/or treatment if the symptoms continue in spite of treatment, as clinically indicated and appropriate.  Routine symptom specific, illness specific and/or disease specific instructions were discussed with the patient and/or caregiver at length.  Prevention strategies for avoiding STD exposure were also discussed.  The patient will follow up with their current PCP if and as advised. If the patient does not currently have a PCP we will assist them in obtaining one.   The patient may need specialty follow up if the symptoms continue, in spite of conservative treatment and management, for further workup, evaluation, consultation and treatment as clinically indicated and appropriate.  Patient/parent/caregiver verbalized understanding and agreement of plan as discussed.  All questions were addressed during visit.  Please see discharge instructions below for further details of plan.  Discharge Instructions:   Discharge Instructions      Common causes of urinary tract infections include but are not limited to holding your urine longer than you should,  squatting instead of sitting down when urinating, sitting around in wet clothing such as a wet swimsuit or gym clothes too long, not emptying your bladder after having sexual intercourse, wiping from back to front instead of front to back after having a bowel movement.     The urinalysis that we performed in the clinic today was abnormal.  Urine culture will be performed per our protocol.  The result of the urine culture will be available in the next 3 to 5 days and will be posted to your MyChart account.  If there is an abnormal finding, you will be contacted by phone and advised of further treatment recommendations or adjustments, if any.   You were advised to begin antibiotics today because your urinalysis is abnormal and you are having active symptoms of an acute lower urinary tract infection also known as cystitis.     Please pick up and begin taking your prescription for Ciprofloxacin 250 mg as soon as possible.  Please take all doses exactly as prescribed.  You can take this medication with or without food.  This medication is safe to take with your other medications.  The results of your vaginal swab test which screens for BV, yeast, gonorrhea, chlamydia and trichomonas will be made posted to your MyChart account once it is complete.  This typically takes 2 to 4 days.  Please abstain from sexual intercourse of any kind, vaginal, oral or anal, until you have received the results of your STD testing.      If any  of your results are abnormal, you will receive a phone call regarding treatment.  Prescriptions, if any are needed, will be provided for you at your pharmacy.      The mainstay of therapy for musculoskeletal pain is reduction of inflammation and relaxation of tension which is causing inflammation.  Keep in mind, pain always begets more pain.  To help you stay ahead of your pain and inflammation, I have provided the following regimen for you:   When you pick up your prescription from the  pharmacy, please begin taking baclofen 10 mg.  This is a highly effective muscle relaxer and antispasmodic which should continue to provide you with relaxation of your tense muscles, allow you to sleep well and to keep your pain under control.   When you pick up your prescription from the pharmacy, please begin taking ibuprofen 600 mg 3 times daily.  Please keep in mind that it is always easier to treat a little bit of pain that is to treat a lot of pain.  I recommend that for the next several days, you take this medication on a scheduled basis.  After that, take it when you begin to feel the pain returning, do not wait until you are in a lot of pain.   During the day, please set aside time to apply ice to the affected area 4 times daily for 20 minutes each application.  This can be achieved by using a bag of frozen peas or corn, a Ziploc bag filled with ice and water, or Ziploc bag filled with half rubbing alcohol and half Dawn dish detergent, frozen into a slush.  Please be careful not to apply ice directly to your skin, always place a soft cloth between you and the ice pack.  Over-the-counter products such as IcyHot and Biofreeze do not work nearly as well.   Please avoid attempts to stretch or strengthen the affected area until you are feeling completely pain-free.  Attempts to do so will only prolong the healing process.   Please consider discussing referral to physical therapy with your primary care provider.  Physical therapist are very good at teasing out the underlying cause of acute musculoskeletal pain and helping with prevention of future recurrences.   I also recommend that you remain out of work for the next few days, I provided you with a note to return to work in 3 days.  If you feel that you need this time extended, please follow-up with your primary care provider or return to urgent care for reevaluation so that we can provide you with a note for another 3 days.   Thank you for visiting  [facility] today.  We appreciate the opportunity to participate in your care.         This office note has been dictated using Teaching laboratory technician.  Unfortunately, this method of dictation can sometimes lead to typographical or grammatical errors.  I apologize for your inconvenience in advance if this occurs.  Please do not hesitate to reach out to me if clarification is needed.       Theadora Rama Scales, PA-C 11/12/22 1309

## 2022-11-12 NOTE — ED Triage Notes (Signed)
Pt c/o suprapubic and lower back pain that began yesterday. Endorses nausea without vomiting. Denies any urinary symptoms.

## 2022-11-13 LAB — CERVICOVAGINAL ANCILLARY ONLY
Bacterial Vaginitis (gardnerella): NEGATIVE
Candida Glabrata: NEGATIVE
Candida Vaginitis: NEGATIVE
Chlamydia: NEGATIVE
Comment: NEGATIVE
Comment: NEGATIVE
Comment: NEGATIVE
Comment: NEGATIVE
Comment: NEGATIVE
Comment: NORMAL
Neisseria Gonorrhea: NEGATIVE
Trichomonas: NEGATIVE

## 2022-11-13 LAB — URINE CULTURE: Culture: <10000 — AB

## 2022-11-14 ENCOUNTER — Encounter: Payer: Self-pay | Admitting: Obstetrics

## 2022-11-14 ENCOUNTER — Telehealth: Payer: Self-pay

## 2022-11-14 NOTE — Telephone Encounter (Signed)
Patient sent mychart message stating that she has been having some irregular cycles since being on depo. Patient states that she after she got her depo in May 21.   Period dates are are as followed.  6/12-7/5 7/6-7/22 8/25-8/31 9/18-10/1  Patient informed that it is not abnormal to have some irregular bleeding while on depo. Patient would like to continue depo at this time. If bleeding does not improve will consider another method of contraception.

## 2022-12-09 ENCOUNTER — Ambulatory Visit: Payer: BC Managed Care – PPO

## 2022-12-09 DIAGNOSIS — Z3042 Encounter for surveillance of injectable contraceptive: Secondary | ICD-10-CM

## 2022-12-09 MED ORDER — MEDROXYPROGESTERONE ACETATE 150 MG/ML IM SUSP
150.0000 mg | Freq: Once | INTRAMUSCULAR | Status: AC
Start: 2022-12-09 — End: 2022-12-09
  Administered 2022-12-09: 150 mg via INTRAMUSCULAR

## 2022-12-09 NOTE — Progress Notes (Signed)
Date last pap: 06/12/2021. Last Depo-Provera: 09/18/2022. Side Effects if any: None. Serum HCG indicated? N/A. Depo-Provera 150 mg IM given by: Dreama Saa, RN,BSN. Given in RUOQ. Patient tolerated well. Next appointment due Jan 20 - Feb 3.Marland Kitchen

## 2022-12-16 ENCOUNTER — Ambulatory Visit
Admission: EM | Admit: 2022-12-16 | Discharge: 2022-12-16 | Disposition: A | Discharge status: Home / Self Care | Payer: BC Managed Care – PPO | Attending: Internal Medicine | Admitting: Internal Medicine

## 2022-12-16 DIAGNOSIS — N898 Other specified noninflammatory disorders of vagina: Secondary | ICD-10-CM | POA: Insufficient documentation

## 2022-12-16 DIAGNOSIS — R0981 Nasal congestion: Secondary | ICD-10-CM | POA: Insufficient documentation

## 2022-12-16 DIAGNOSIS — R5383 Other fatigue: Secondary | ICD-10-CM | POA: Diagnosis not present

## 2022-12-16 DIAGNOSIS — Z1152 Encounter for screening for COVID-19: Secondary | ICD-10-CM | POA: Diagnosis not present

## 2022-12-16 DIAGNOSIS — J029 Acute pharyngitis, unspecified: Secondary | ICD-10-CM | POA: Diagnosis not present

## 2022-12-16 DIAGNOSIS — Z113 Encounter for screening for infections with a predominantly sexual mode of transmission: Secondary | ICD-10-CM | POA: Diagnosis not present

## 2022-12-16 DIAGNOSIS — R110 Nausea: Secondary | ICD-10-CM | POA: Diagnosis not present

## 2022-12-16 LAB — POCT INFLUENZA A/B
Influenza A, POC: NEGATIVE
Influenza B, POC: NEGATIVE

## 2022-12-16 LAB — POCT RAPID STREP A (OFFICE): Rapid Strep A Screen: NEGATIVE

## 2022-12-16 MED ORDER — METRONIDAZOLE 500 MG PO TABS
500.0000 mg | ORAL_TABLET | Freq: Two times a day (BID) | ORAL | 0 refills | Status: AC
Start: 2022-12-16 — End: 2022-12-23

## 2022-12-16 MED ORDER — BENZONATATE 200 MG PO CAPS: 200.0000 mg | ORAL_CAPSULE | Freq: Three times a day (TID) | ORAL | 0 refills | Status: DC | PRN

## 2022-12-16 MED ORDER — FLUTICASONE PROPIONATE 50 MCG/ACT NA SUSP: 1.0000 | Freq: Every day | NASAL | 0 refills | Status: DC | PRN

## 2022-12-16 NOTE — Discharge Instructions (Addendum)
Your flu test was negative. COVID was sent to the lab for further testing. Someone from our office will call you with your results.Your symptoms appear consistent with a viral respiratory illness. Recommend symptomatic treatment with nasal spray (Flonase) for congestion and Tylenol/Ibuprofen as needed for fevers/aches. I have also sent you something in for a cough. Return in 3 to 4 days if no improvement. If new or worsening symptoms such as presistent fevers, difficulty breathing, shortness of breath, or chest pain, go directly to the ER.  Your swab were sent to the lab for further testing.  You will be called with results. Flagyl is an antibiotic given to treat vaginal infections. Take the prescription as directed.  You should avoid all sexual activity until you have been notified of all your results and have undergone any necessary treatment.  If you are positive, it is recommended that you inform all sexual partners so they can treat be treated as well before having sex again.

## 2022-12-16 NOTE — ED Triage Notes (Signed)
Requesting STD testing - abnormal vaginal discharge. Pt c/o Friday with sore throat, this am sniffling, stuffy nose, congestion in chest, sneezing, fatigue. Home remedies: TheraFlu

## 2022-12-16 NOTE — ED Provider Notes (Signed)
[facility]  Note:  This document was prepared using Dragon voice recognition software and may include unintentional dictation errors.  MRN: [id number] DOB: Sep 16, 1995 DATE: 12/16/22   Subjective:  Chief Complaint:  Chief Complaint  Patient presents with   Sore Throat     HPI: Ashley [name] is a 27 y.o. female presenting for sore throat and vaginal discharge. Patient reports abnormal vaginal discharge starting a few days ago. She is requesting STD testing. She states that the discharge is yellow with no odor. Denies pruritus.   Patient also presents for sore throat starting 4 day ago. She states that it improved over the weekend, but returned this morning. She has since developed nasal congestion, rhinorrhea, and fatigue this morning. Several sick contacts at work. She has taken Teraflu this morning with no relief. Denies fever, vomiting, abdominal pain, otalgia. Endorses nasal congestion, rhinorrhea, sore throat, cough, nausea. Presents NAD.  Prior to Admission medications   Medication Sig Start Date End Date Taking? Authorizing Provider  hydrOXYzine (ATARAX) 25 MG tablet Take by mouth. 10/02/22  Yes [provider]  ibuprofen (ADVIL) 600 MG tablet Take 1 tablet (600 mg total) by mouth every 8 (eight) hours as needed for up to 30 doses for fever, headache, mild pain or moderate pain (Inflammation). Take 1 tablet 3 times daily as needed for inflammation of upper airways and/or pain. 11/12/22   Theadora Rama Scales, PA-C  medroxyPROGESTERone (DEPO-PROVERA) 150 MG/ML injection Inject 1 mL (150 mg total) into the muscle every 3 (three) months. 06/25/22   Brock Bad, MD  omeprazole (PRILOSEC) 20 MG capsule Take 1 capsule (20 mg total) by mouth daily. 10/23/22   Waldon Merl, PA-C  SYMBICORT 160-4.5 MCG/ACT inhaler SMARTSIG:2 Puff(s) By Mouth Twice Daily 07/17/21   [provider]  valACYclovir (VALTREX) 500 MG tablet TAKE AS DIRECTED FOR 3 DAYS AS NEEDED FOR  EACH OUTBREAK 08/06/22   Constant, Gigi Gin, MD  VENTOLIN HFA 108 (90 Base) MCG/ACT inhaler SMARTSIG:1 Puff(s) By Mouth Every 4-6 Hours PRN 06/11/21   [provider]     Allergies  Allergen Reactions   Penicillins Anaphylaxis, Hives and Rash    Foamed from mouth Has patient had a PCN reaction causing immediate rash, facial/tongue/throat swelling, SOB or lightheadedness with hypotension: Yes Has patient had a PCN reaction causing severe rash involving mucus membranes or skin necrosis: No Has patient had a PCN reaction that required hospitalization No Has patient had a PCN reaction occurring within the last 10 years: No If all of the above answers are "NO", then may proceed with Cephalosporin use.     History:   Past Medical History:  Diagnosis Date   Concussion without loss of consciousness 12/02/2014     Past Surgical History:  Procedure Laterality Date   KNEE SURGERY Left 12/2019    Family History  Problem Relation Age of Onset   Diabetes Mother    Hypertension Mother    Diabetes Sister    Hypertension Sister     Social History   Tobacco Use   Smoking status: Never   Smokeless tobacco: Never  Vaping Use   Vaping status: Never Used  Substance Use Topics   Alcohol use: Not Currently    Comment: occ   Drug use: No    Review of Systems  Constitutional:  Positive for fatigue. Negative for fever.  HENT:  Positive for congestion, postnasal drip, rhinorrhea, sneezing and sore throat. Negative for ear pain.   Respiratory:  Positive for  cough.   Gastrointestinal:  Positive for nausea. Negative for abdominal pain and vomiting.  Genitourinary:  Positive for vaginal discharge.     Objective:   Vitals: BP 112/79 (BP Location: Right Arm)   Pulse 76   Temp 98.7 F (37.1 C) (Oral)   Resp 18   SpO2 98%   Physical Exam Constitutional:      General: She is not in acute distress.    Appearance: Normal appearance. She is well-developed and normal weight. She is not  ill-appearing or toxic-appearing.  HENT:     Head: Normocephalic and atraumatic.     Right Ear: Ear canal normal. A middle ear effusion is present.     Left Ear: Ear canal normal. A middle ear effusion is present.     Nose: Rhinorrhea present. Rhinorrhea is clear.     Right Turbinates: Enlarged.     Left Turbinates: Enlarged.     Mouth/Throat:     Pharynx: Oropharynx is clear. Uvula midline. Posterior oropharyngeal erythema present. No pharyngeal swelling or oropharyngeal exudate.     Tonsils: No tonsillar exudate or tonsillar abscesses.  Cardiovascular:     Rate and Rhythm: Normal rate and regular rhythm.     Heart sounds: Normal heart sounds.  Pulmonary:     Effort: Pulmonary effort is normal.     Breath sounds: Normal breath sounds.     Comments: Clear to auscultation bilaterally  Abdominal:     General: Bowel sounds are normal.     Palpations: Abdomen is soft.     Tenderness: There is no abdominal tenderness.  Skin:    General: Skin is warm and dry.  Neurological:     General: No focal deficit present.     Mental Status: She is alert.  Psychiatric:        Mood and Affect: Mood and affect normal.     Results:  Labs: No results found for this or any previous visit (from the past 24 hour(s)).  Radiology: No results found.   UC Course/Treatments:  Procedures: Procedures   Medications Ordered in UC: Medications - No data to display   Assessment and Plan :     ICD-10-CM   1. Acute pharyngitis, unspecified etiology  J02.9 SARS CORONAVIRUS 2 (TAT 6-24 HRS) Anterior Nasal Swab    SARS CORONAVIRUS 2 (TAT 6-24 HRS) Anterior Nasal Swab    2. Vaginal discharge  N89.8     3. Nasal congestion  R09.81      Acute pharyngitis, unspecified etiology Afebrile, nontoxic-appearing, NAD. VSS. DDX includes but not limited to: strep, mono, COVID, post nasal drip, viral URI Strep was negative today in office. COVID is pending. Suspect viral URI. Recommend salt water gargles and  over-the-counter throat spray/lozenges. Strict ED precautions were given and patient verbalized understanding.  Nasal Congestion Afebrile, nontoxic-appearing, NAD. VSS. DDX includes but not limited to: COVID, flu, allergic rhinitis, viral URI Flu was negative today in office. COVID is pending. Suspect viral URI. Flonase one spray each nostril every day PRN was prescribed for congestion as well as Benzonatate 200mg  TID PRN for any cough. Strict ED precautions were given and patient verbalized understanding.  Vaginal discharge Afebrile, nontoxic-appearing, NAD. VSS. DDX includes but not limited to: BV, candidiasis, STD Cytology is pending. Suspect BV. Flagyl 500mg  BID was prescribed. Safe sex precautions advised. Patient declined HIV and RPR at this time. Strict ED precautions were given and patient verbalized understanding.  ED Discharge Orders  Ordered    metroNIDAZOLE (FLAGYL) 500 MG tablet  Every 12 hours        12/16/22 0914    benzonatate (TESSALON) 200 MG capsule  3 times daily PRN        12/16/22 0919    fluticasone (FLONASE) 50 MCG/ACT nasal spray  Daily PRN        12/16/22 0919             PDMP not reviewed this encounter.     Cynda Acres, PA-C 12/16/22 0920

## 2022-12-17 LAB — CERVICOVAGINAL ANCILLARY ONLY
Bacterial Vaginitis (gardnerella): NEGATIVE
Candida Glabrata: NEGATIVE
Candida Vaginitis: NEGATIVE
Chlamydia: NEGATIVE
Comment: NEGATIVE
Comment: NEGATIVE
Comment: NEGATIVE
Comment: NEGATIVE
Comment: NEGATIVE
Comment: NORMAL
Neisseria Gonorrhea: NEGATIVE
Trichomonas: NEGATIVE

## 2022-12-17 LAB — SARS CORONAVIRUS 2 (TAT 6-24 HRS): SARS Coronavirus 2: NEGATIVE

## 2022-12-28 ENCOUNTER — Emergency Department (HOSPITAL_COMMUNITY): Payer: BC Managed Care – PPO

## 2022-12-28 ENCOUNTER — Emergency Department (HOSPITAL_COMMUNITY)
Admission: EM | Admit: 2022-12-28 | Discharge: 2022-12-28 | Disposition: A | Discharge status: Home / Self Care | Payer: BC Managed Care – PPO | Attending: Emergency Medicine | Admitting: Emergency Medicine

## 2022-12-28 DIAGNOSIS — R569 Unspecified convulsions: Secondary | ICD-10-CM | POA: Diagnosis not present

## 2022-12-28 DIAGNOSIS — R1111 Vomiting without nausea: Secondary | ICD-10-CM | POA: Diagnosis not present

## 2022-12-28 DIAGNOSIS — R110 Nausea: Secondary | ICD-10-CM | POA: Diagnosis not present

## 2022-12-28 DIAGNOSIS — R0689 Other abnormalities of breathing: Secondary | ICD-10-CM | POA: Diagnosis not present

## 2022-12-28 DIAGNOSIS — R9431 Abnormal electrocardiogram [ECG] [EKG]: Secondary | ICD-10-CM | POA: Diagnosis not present

## 2022-12-28 LAB — ETHANOL: Alcohol, Ethyl (B): 98 mg/dL — ABNORMAL HIGH (ref <10)

## 2022-12-28 LAB — COMPREHENSIVE METABOLIC PANEL
ALT: 25 U/L (ref 0–44)
AST: 29 U/L (ref 15–41)
Albumin: 4.3 g/dL (ref 3.5–5.0)
Alkaline Phosphatase: 61 U/L (ref 38–126)
Anion gap: 14 (ref 5–15)
BUN: 8 mg/dL (ref 6–20)
CO2: 17 mmol/L — ABNORMAL LOW (ref 22–32)
Calcium: 9.5 mg/dL (ref 8.9–10.3)
Chloride: 108 mmol/L (ref 98–111)
Creatinine, Ser: 0.67 mg/dL (ref 0.44–1.00)
GFR, Estimated: >60 mL/min (ref >60)
Glucose, Bld: 104 mg/dL — ABNORMAL HIGH (ref 70–99)
Potassium: 3.7 mmol/L (ref 3.5–5.1)
Sodium: 139 mmol/L (ref 135–145)
Total Bilirubin: 0.8 mg/dL (ref <1.2)
Total Protein: 7.7 g/dL (ref 6.5–8.1)

## 2022-12-28 LAB — URINALYSIS, ROUTINE W REFLEX MICROSCOPIC
Bilirubin Urine: NEGATIVE
Glucose, UA: NEGATIVE mg/dL
Hgb urine dipstick: NEGATIVE
Ketones, ur: NEGATIVE mg/dL
Leukocytes,Ua: NEGATIVE
Nitrite: NEGATIVE
Protein, ur: NEGATIVE mg/dL
Specific Gravity, Urine: 1.009 (ref 1.005–1.030)
pH: 7 (ref 5.0–8.0)

## 2022-12-28 LAB — CBC WITH DIFFERENTIAL/PLATELET
Abs Immature Granulocytes: 0.02 10*3/uL (ref 0.00–0.07)
Basophils Absolute: 0 10*3/uL (ref 0.0–0.1)
Basophils Relative: 1 %
Eosinophils Absolute: 0 10*3/uL (ref 0.0–0.5)
Eosinophils Relative: 0 %
HCT: 36.9 % (ref 36.0–46.0)
Hemoglobin: 12.3 g/dL (ref 12.0–15.0)
Immature Granulocytes: 0 %
Lymphocytes Relative: 25 %
Lymphs Abs: 1.7 10*3/uL (ref 0.7–4.0)
MCH: 29.9 pg (ref 26.0–34.0)
MCHC: 33.3 g/dL (ref 30.0–36.0)
MCV: 89.8 fL (ref 80.0–100.0)
Monocytes Absolute: 0.4 10*3/uL (ref 0.1–1.0)
Monocytes Relative: 6 %
Neutro Abs: 4.5 10*3/uL (ref 1.7–7.7)
Neutrophils Relative %: 68 %
Platelets: 277 10*3/uL (ref 150–400)
RBC: 4.11 MIL/uL (ref 3.87–5.11)
RDW: 13.1 % (ref 11.5–15.5)
WBC: 6.6 10*3/uL (ref 4.0–10.5)
nRBC: 0 % (ref 0.0–0.2)

## 2022-12-28 LAB — RAPID URINE DRUG SCREEN, HOSP PERFORMED
Amphetamines: NOT DETECTED
Barbiturates: NOT DETECTED
Benzodiazepines: POSITIVE — AB
Cocaine: NOT DETECTED
Opiates: NOT DETECTED
Tetrahydrocannabinol: NOT DETECTED

## 2022-12-28 LAB — CBG MONITORING, ED: Glucose-Capillary: 125 mg/dL — ABNORMAL HIGH (ref 70–99)

## 2022-12-28 LAB — SALICYLATE LEVEL: Salicylate Lvl: <7 mg/dL — ABNORMAL LOW (ref 7.0–30.0)

## 2022-12-28 LAB — ACETAMINOPHEN LEVEL: Acetaminophen (Tylenol), Serum: <10 ug/mL — ABNORMAL LOW (ref 10–30)

## 2022-12-28 LAB — HCG, SERUM, QUALITATIVE: Preg, Serum: NEGATIVE

## 2022-12-28 MED ORDER — ONDANSETRON HCL 4 MG/2ML IJ SOLN
4.0000 mg | Freq: Once | INTRAMUSCULAR | Status: AC
  Administered 2022-12-28: 4 mg via INTRAVENOUS
  Filled 2022-12-28: qty 2

## 2022-12-28 NOTE — Discharge Instructions (Addendum)
Do not drive or swim or operate machinery until evaluated by neurology.   Referral made, neurology should reach out to you to schedule an appointment here soon.  If appointment is not scheduled, reach out to neurology so you can schedule a follow-up.

## 2022-12-28 NOTE — ED Provider Notes (Signed)
Ashley [name] EMERGENCY DEPARTMENT AT [facility] Provider Note   CSN: [id number] Arrival date & time: 12/28/22  0230     History  Chief Complaint  Patient presents with   Seizures    Patient to ED via EMS with complaint of seizure. Patient reportedly was out drinking alcohol and had a seizure when she got home. Family called 911. EMS gave 5 mg Midazolam IM in route. Patient is sleeping at this time.    Ashley [name] is a 27 y.o. female.  Patient arrived to the ED obtunded after suffering what a friend called a seizure.  Friend reported that they were both drinking at the bar.  Unknown amount of drinks were consumed.  They then drove home where she began vomiting according to friend.  He states that she then looked at him said"I think I am going to have a seizure" where friend then states that she began seizing for 10 minutes pausing occasionally for 10 to 15 seconds before began seizing again.  He describes seizure as "shaking back-and-forth" and is unable to describe further what he saw.  She stopped shortly after EMS arrived.  Where then according to friend patient then went over and got dressed and new close and then went with EMS.  EMS provided 5 mg of midazolam en route to the hospital.   Later questioning when she was responsive, revealed that she has previously had a seizure a year before where she states was similar to the one she had today.  She has never been evaluated for the seizure.  She states that "EMS came and saw her but told her friend to simply hold her head and then she woke up the next morning."  Does not know if she had an episode of incontinence.  The history is provided by a friend and a relative.  Seizures      Home Medications Prior to Admission medications   Medication Sig Start Date End Date Taking? Authorizing Provider  benzonatate (TESSALON) 200 MG capsule Take 1 capsule (200 mg total) by mouth 3 (three) times daily as needed for cough. 12/16/22    Hermanns, Ashlee P, PA-C  fluticasone (FLONASE) 50 MCG/ACT nasal spray Place 1 spray into both nostrils daily as needed. 12/16/22   Hermanns, Ashlee P, PA-C  hydrOXYzine (ATARAX) 25 MG tablet Take by mouth. 10/02/22   [provider]  ibuprofen (ADVIL) 600 MG tablet Take 1 tablet (600 mg total) by mouth every 8 (eight) hours as needed for up to 30 doses for fever, headache, mild pain or moderate pain (Inflammation). Take 1 tablet 3 times daily as needed for inflammation of upper airways and/or pain. 11/12/22   Theadora Rama Scales, PA-C  medroxyPROGESTERone (DEPO-PROVERA) 150 MG/ML injection Inject 1 mL (150 mg total) into the muscle every 3 (three) months. 06/25/22   Brock Bad, MD  omeprazole (PRILOSEC) 20 MG capsule Take 1 capsule (20 mg total) by mouth daily. 10/23/22   Waldon Merl, PA-C  SYMBICORT 160-4.5 MCG/ACT inhaler SMARTSIG:2 Puff(s) By Mouth Twice Daily 07/17/21   [provider]  valACYclovir (VALTREX) 500 MG tablet TAKE AS DIRECTED FOR 3 DAYS AS NEEDED FOR EACH OUTBREAK 08/06/22   Constant, Peggy, MD  VENTOLIN HFA 108 (90 Base) MCG/ACT inhaler SMARTSIG:1 Puff(s) By Mouth Every 4-6 Hours PRN 06/11/21   [provider]      Allergies    Penicillins    Review of Systems   Review of Systems  Neurological:  Positive  for seizures.  Psychiatric/Behavioral:  Positive for confusion.   All other systems reviewed and are negative.   Physical Exam Updated Vital Signs BP 106/74   Pulse 90   Temp 98.4 F (36.9 C) (Oral)   Resp 16   Ht 5\' 8"  (1.727 m)   Wt 60 kg   SpO2 98%   BMI 20.11 kg/m  Physical Exam Vitals and nursing note reviewed.  Constitutional:      Comments: Patient obtunded.  HENT:     Head: Normocephalic and atraumatic.     Right Ear: Tympanic membrane, ear canal and external ear normal.     Left Ear: Tympanic membrane, ear canal and external ear normal.     Nose: Nose normal.     Mouth/Throat:     Mouth: Mucous membranes are  moist.     Pharynx: Oropharynx is clear.     Comments: Tongue atraumatic with no noted abnormalities. Eyes:     Extraocular Movements: Extraocular movements intact.     Conjunctiva/sclera: Conjunctivae normal.     Pupils: Pupils are equal, round, and reactive to light.  Cardiovascular:     Rate and Rhythm: Normal rate and regular rhythm.     Pulses: Normal pulses.     Heart sounds: Normal heart sounds. No murmur heard. Pulmonary:     Effort: Pulmonary effort is normal. No respiratory distress.     Breath sounds: Normal breath sounds.  Chest:     Chest wall: No tenderness.  Abdominal:     General: Abdomen is flat. There is no distension.     Palpations: Abdomen is soft.  Musculoskeletal:        General: No signs of injury.     Cervical back: No tenderness.  Lymphadenopathy:     Cervical: No cervical adenopathy.  Skin:    General: Skin is warm and dry.     Coloration: Skin is not pale.     Findings: No bruising, erythema or rash.  Neurological:     Mental Status: She is disoriented.     Comments: Patient opens eyes with sternal rub but does not respond to verbal commands.     ED Results / Procedures / Treatments   Labs (all labs ordered are listed, but only abnormal results are displayed) Labs Reviewed  COMPREHENSIVE METABOLIC PANEL - Abnormal; Notable for the following components:      Result Value   CO2 17 (*)    Glucose, Bld 104 (*)    All other components within normal limits  ETHANOL - Abnormal; Notable for the following components:   Alcohol, Ethyl (B) 98 (*)    All other components within normal limits  ACETAMINOPHEN LEVEL - Abnormal; Notable for the following components:   Acetaminophen (Tylenol), Serum <10 (*)    All other components within normal limits  SALICYLATE LEVEL - Abnormal; Notable for the following components:   Salicylate Lvl <7.0 (*)    All other components within normal limits  RAPID URINE DRUG SCREEN, HOSP PERFORMED - Abnormal; Notable for the  following components:   Benzodiazepines POSITIVE (*)    All other components within normal limits  CBG MONITORING, ED - Abnormal; Notable for the following components:   Glucose-Capillary 125 (*)    All other components within normal limits  CBC WITH DIFFERENTIAL/PLATELET  URINALYSIS, ROUTINE W REFLEX MICROSCOPIC  HCG, SERUM, QUALITATIVE    EKG EKG Interpretation Date/Time:  Saturday December 28 2022 04:19:24 EST Ventricular Rate:  89 PR Interval:  113 QRS  Duration:  80 QT Interval:  380 QTC Calculation: 463 R Axis:   85  Text Interpretation: Sinus rhythm Borderline short PR interval Confirmed by Alona Bene [phone]) on 12/28/2022 4:54:33 AM  Radiology CT Head Wo Contrast  Result Date: 12/28/2022 CLINICAL DATA:  Seizure, new onset, no history of trauma. EXAM: CT HEAD WITHOUT CONTRAST TECHNIQUE: Contiguous axial images were obtained from the base of the skull through the vertex without intravenous contrast. RADIATION DOSE REDUCTION: This exam was performed according to the departmental dose-optimization program which includes automated exposure control, adjustment of the mA and/or kV according to patient size and/or use of iterative reconstruction technique. COMPARISON:  11/25/2017. FINDINGS: Brain: No acute intracranial hemorrhage, midline shift or mass effect. No extra-axial fluid collection. Gray-white matter differentiation is within normal limits. No hydrocephalus. Vascular: No hyperdense vessel or unexpected calcification. Skull: Normal. Negative for fracture or focal lesion. Sinuses/Orbits: There is partial opacification of the ethmoid air cells on the right. No acute orbital abnormality. Other: None. IMPRESSION: No acute intracranial process. Electronically Signed   By: Thornell Sartorius M.D.   On: 12/28/2022 04:07    Procedures Procedures    Medications Ordered in ED Medications  ondansetron (ZOFRAN) injection 4 mg (4 mg Intravenous Given 12/28/22 0450)    ED Course/ Medical  Decision Making/ A&P Clinical Course as of 12/28/22 6045  Sat Dec 28, 2022  0520 Acetaminophen (Tylenol), S(!): <10 [CB]    Clinical Course User Index [CB] Lunette Stands, PA-C                               Medical Decision Making Amount and/or Complexity of Data Reviewed Labs: ordered. Radiology: ordered.     Patient presents to the ED for concern of possible seizure, this involves an extensive number of treatment options, and is a complaint that carries with it a high risk of complications and morbidity.  The differential diagnosis includes seizure, intoxication, drug overdose, cerebral mass, metabolic dysfunction.    Co morbidities that complicate the patient evaluation  Previous history of severe major depression  Additional history obtained:  Additional history obtained from  EMS, Family, and Nursing   Patient's friend's history does not coincide with events.  Friend admits to also drinking.   Lab Tests:  I Ordered, and personally interpreted labs.  The pertinent results include:   Ethanol 98 EDT benzodiazepines positive --EMS provided en route to hospital   Imaging Studies ordered:  I ordered imaging studies including CT head I independently visualized and interpreted imaging which showed no acute abnormality, unremarkable I agree with the radiologist interpretation   Cardiac Monitoring:  The patient was maintained on a cardiac monitor.  I personally viewed and interpreted the cardiac monitored which showed an underlying rhythm of: NSR   Medicines ordered and prescription drug management:  I ordered medication including Zofran for nausea Reevaluation of the patient after these medicines showed that the patient improved I have reviewed the patients home medicines and have made adjustments as needed   Test Considered:  EEG --no acute seizure activity   Problem List / ED Course:  Seizure-like activity --patient is a 27 year old female who presents  to the ED today with complaints of seizure-like activity stated by friend who watched her "shake back-and-forth" after they got home after drinking at the bar.  Friends story seems unreliable as he also had been drinking he states he saw the seizure happened.  Patient story  also seems unreliable as it is difficult to believe EMS simply told patient's friend to hold her head while she had a seizure and left her on scene.  When she initially arrived to the ED she was obtunded after given 5 mg of midazolam.  Seizure workup was then done with no notable results.  When she became alert, she became nauseous and requested nausea medication.  Zofran was given.  Alerted patient that she should follow-up with neurology and referred her.  Told patient to not drive, swim, operate heavy machinery until she is evaluated by neurology.   Reevaluation:  After the interventions noted above, I reevaluated the patient and found that they have :improved   Social Determinants of Health:  Risk taking behavior   Dispostion:  After consideration of the diagnostic results and the patients response to treatment, I feel that the patent would benefit from treatment noted as above and discharge with outpatient neurology follow-up within the next 2 weeks.    Final Clinical Impression(s) / ED Diagnoses Final diagnoses:  Seizure-like activity (HCC)    Rx / DC Orders ED Discharge Orders          Ordered    Ambulatory referral to Neurology       Comments: An appointment is requested in approximately: 2 weeks   12/28/22 0528              Lunette Stands, PA-C 12/28/22 1610    Maia Plan, MD 12/29/22 [phone]

## 2022-12-30 ENCOUNTER — Other Ambulatory Visit: Payer: Self-pay

## 2022-12-30 ENCOUNTER — Observation Stay (HOSPITAL_COMMUNITY)
Admission: EM | Admit: 2022-12-30 | Discharge: 2022-12-31 | Disposition: A | Discharge status: Home / Self Care | Payer: BC Managed Care – PPO | Attending: Family Medicine | Admitting: Family Medicine

## 2022-12-30 ENCOUNTER — Encounter (HOSPITAL_COMMUNITY): Payer: Self-pay | Admitting: Internal Medicine

## 2022-12-30 ENCOUNTER — Observation Stay (HOSPITAL_COMMUNITY): Payer: BC Managed Care – PPO

## 2022-12-30 DIAGNOSIS — R202 Paresthesia of skin: Secondary | ICD-10-CM | POA: Diagnosis not present

## 2022-12-30 DIAGNOSIS — E876 Hypokalemia: Secondary | ICD-10-CM | POA: Diagnosis not present

## 2022-12-30 DIAGNOSIS — R9431 Abnormal electrocardiogram [ECG] [EKG]: Secondary | ICD-10-CM | POA: Diagnosis not present

## 2022-12-30 DIAGNOSIS — R569 Unspecified convulsions: Principal | ICD-10-CM

## 2022-12-30 DIAGNOSIS — Z96652 Presence of left artificial knee joint: Secondary | ICD-10-CM | POA: Insufficient documentation

## 2022-12-30 DIAGNOSIS — G4489 Other headache syndrome: Secondary | ICD-10-CM | POA: Diagnosis not present

## 2022-12-30 DIAGNOSIS — F419 Anxiety disorder, unspecified: Secondary | ICD-10-CM | POA: Diagnosis not present

## 2022-12-30 DIAGNOSIS — F32A Depression, unspecified: Secondary | ICD-10-CM | POA: Insufficient documentation

## 2022-12-30 LAB — BASIC METABOLIC PANEL
Anion gap: 6 (ref 5–15)
BUN: 10 mg/dL (ref 6–20)
CO2: 22 mmol/L (ref 22–32)
Calcium: 9.5 mg/dL (ref 8.9–10.3)
Chloride: 109 mmol/L (ref 98–111)
Creatinine, Ser: 0.67 mg/dL (ref 0.44–1.00)
GFR, Estimated: >60 mL/min (ref >60)
Glucose, Bld: 90 mg/dL (ref 70–99)
Potassium: 3.4 mmol/L — ABNORMAL LOW (ref 3.5–5.1)
Sodium: 137 mmol/L (ref 135–145)

## 2022-12-30 LAB — CBC WITH DIFFERENTIAL/PLATELET
Abs Immature Granulocytes: 0.02 10*3/uL (ref 0.00–0.07)
Basophils Absolute: 0.1 10*3/uL (ref 0.0–0.1)
Basophils Relative: 1 %
Eosinophils Absolute: 0.1 10*3/uL (ref 0.0–0.5)
Eosinophils Relative: 1 %
HCT: 40.1 % (ref 36.0–46.0)
Hemoglobin: 12.8 g/dL (ref 12.0–15.0)
Immature Granulocytes: 0 %
Lymphocytes Relative: 43 %
Lymphs Abs: 2.5 10*3/uL (ref 0.7–4.0)
MCH: 29.4 pg (ref 26.0–34.0)
MCHC: 31.9 g/dL (ref 30.0–36.0)
MCV: 92 fL (ref 80.0–100.0)
Monocytes Absolute: 0.5 10*3/uL (ref 0.1–1.0)
Monocytes Relative: 8 %
Neutro Abs: 2.6 10*3/uL (ref 1.7–7.7)
Neutrophils Relative %: 47 %
Platelets: 268 10*3/uL (ref 150–400)
RBC: 4.36 MIL/uL (ref 3.87–5.11)
RDW: 12.9 % (ref 11.5–15.5)
WBC: 5.7 10*3/uL (ref 4.0–10.5)
nRBC: 0 % (ref 0.0–0.2)

## 2022-12-30 LAB — HCG, SERUM, QUALITATIVE: Preg, Serum: NEGATIVE

## 2022-12-30 LAB — CBG MONITORING, ED: Glucose-Capillary: 104 mg/dL — ABNORMAL HIGH (ref 70–99)

## 2022-12-30 MED ORDER — BISACODYL 5 MG PO TBEC: 5.0000 mg | DELAYED_RELEASE_TABLET | Freq: Every day | ORAL | Status: DC | PRN

## 2022-12-30 MED ORDER — POTASSIUM CHLORIDE CRYS ER 20 MEQ PO TBCR
40.0000 meq | EXTENDED_RELEASE_TABLET | Freq: Once | ORAL | Status: AC
  Administered 2022-12-30: 40 meq via ORAL
  Filled 2022-12-30: qty 2

## 2022-12-30 MED ORDER — GADOBUTROL 1 MMOL/ML IV SOLN
6.0000 mL | Freq: Once | INTRAVENOUS | Status: AC | PRN
  Administered 2022-12-30: 6 mL via INTRAVENOUS

## 2022-12-30 MED ORDER — SENNOSIDES-DOCUSATE SODIUM 8.6-50 MG PO TABS: 1.0000 | ORAL_TABLET | Freq: Every evening | ORAL | Status: DC | PRN

## 2022-12-30 MED ORDER — ENOXAPARIN SODIUM 40 MG/0.4ML IJ SOSY: 40.0000 mg | PREFILLED_SYRINGE | INTRAMUSCULAR | Status: DC

## 2022-12-30 MED ORDER — ONDANSETRON HCL 4 MG/2ML IJ SOLN: 4.0000 mg | Freq: Four times a day (QID) | INTRAMUSCULAR | Status: DC | PRN

## 2022-12-30 MED ORDER — ONDANSETRON HCL 4 MG PO TABS: 4.0000 mg | ORAL_TABLET | Freq: Four times a day (QID) | ORAL | Status: DC | PRN

## 2022-12-30 MED ORDER — ACETAMINOPHEN 325 MG PO TABS
650.0000 mg | ORAL_TABLET | Freq: Once | ORAL | Status: AC
Start: 2022-12-30 — End: 2022-12-30
  Administered 2022-12-30: 650 mg via ORAL
  Filled 2022-12-30: qty 2

## 2022-12-30 MED ORDER — ACETAMINOPHEN 650 MG RE SUPP: 650.0000 mg | Freq: Four times a day (QID) | RECTAL | Status: DC | PRN

## 2022-12-30 MED ORDER — ACETAMINOPHEN 325 MG PO TABS: 650.0000 mg | ORAL_TABLET | Freq: Four times a day (QID) | ORAL | Status: DC | PRN

## 2022-12-30 NOTE — Assessment & Plan Note (Addendum)
Presenting with 2 seizure-like episodes on 11/23 and 11/25.  Neurology recommending admission for further workup and evaluation. -Obtain MRI brain w/wo -Routine EEG -Seizure precautions -Further recommendations as per neurology

## 2022-12-30 NOTE — Assessment & Plan Note (Signed)
Oral supplement given.

## 2022-12-30 NOTE — Progress Notes (Signed)
Pt in MRI as per epic. Eeg to be attempted when pt is back in room, as schedule allows

## 2022-12-30 NOTE — ED Notes (Signed)
ED TO INPATIENT HANDOFF REPORT  ED Nurse Name and Phone #: Francene Castle [phone]  S Name/Age/Gender Ashley [name] 27 y.o. female Room/Bed: 043C/043C  Code Status   Code Status: Full Code  Home/SNF/Other Home Patient oriented to: self, place, time, and situation Is this baseline? Yes   Triage Complete: Triage complete  Chief Complaint Seizure-like activity [place]) [R56.9]  Triage Note Patient complaining of headache and numbness and tingling in all extremities so called EMS. Core and bilateral leg tremoring observed by EMS. Resistant to pupillary exam, no urinary incontinence, no oral trauma. Seen Friday for same and given outpatient referral to neurology, and the appointment is not until 2/6.    Allergies Allergies  Allergen Reactions   Penicillins Anaphylaxis, Hives and Rash    Foamed from mouth Has patient had a PCN reaction causing immediate rash, facial/tongue/throat swelling, SOB or lightheadedness with hypotension: Yes Has patient had a PCN reaction causing severe rash involving mucus membranes or skin necrosis: No Has patient had a PCN reaction that required hospitalization No Has patient had a PCN reaction occurring within the last 10 years: No If all of the above answers are "NO", then may proceed with Cephalosporin use.     Level of Care/Admitting Diagnosis ED Disposition     ED Disposition  Admit   Condition  --   Comment  Hospital Area: [facility] [[id number]]  Level of Care: Telemetry Medical [104]  May place patient in observation at [facility] or [facility] if equivalent level of care is available:: No  Covid Evaluation: Asymptomatic - no recent exposure (last 10 days) testing not required  Diagnosis: Seizure-like activity [facility]) [[id number]]  Admitting Physician: Charlsie Quest [[id number]]  Attending Physician: Charlsie Quest [[id number]]          B Medical/Surgery History Past Medical History:  Diagnosis Date   Concussion  without loss of consciousness 12/02/2014   Past Surgical History:  Procedure Laterality Date   KNEE SURGERY Left 12/2019     A IV Location/Drains/Wounds Patient Lines/Drains/Airways Status     Active Line/Drains/Airways     Name Placement date Placement time Site Days   Peripheral IV 12/30/22 22 G Left Antecubital 12/30/22  2205  Antecubital  less than 1            Intake/Output Last 24 hours No intake or output data in the 24 hours ending 12/30/22 2214  Labs/Imaging Results for orders placed or performed during the hospital encounter of 12/30/22 (from the past 48 hour(s))  CBG monitoring, ED     Status: Abnormal   Collection Time: 12/30/22  2:02 PM  Result Value Ref Range   Glucose-Capillary 104 (H) 70 - 99 mg/dL    Comment: Glucose reference range applies only to samples taken after fasting for at least 8 hours.  Basic metabolic panel     Status: Abnormal   Collection Time: 12/30/22  2:47 PM  Result Value Ref Range   Sodium 137 135 - 145 mmol/L   Potassium 3.4 (L) 3.5 - 5.1 mmol/L   Chloride 109 98 - 111 mmol/L   CO2 22 22 - 32 mmol/L   Glucose, Bld 90 70 - 99 mg/dL    Comment: Glucose reference range applies only to samples taken after fasting for at least 8 hours.   BUN 10 6 - 20 mg/dL   Creatinine, Ser 3.24 0.44 - 1.00 mg/dL   Calcium 9.5 8.9 - 40.1 mg/dL   GFR, Estimated >02 >72 mL/min  Comment: (NOTE) Calculated using the CKD-EPI Creatinine Equation (2021)    Anion gap 6 5 - 15    Comment: Performed at [facility], [address], [place], [place] [address]  CBC with Differential     Status: None   Collection Time: 12/30/22  2:47 PM  Result Value Ref Range   WBC 5.7 4.0 - 10.5 K/uL   RBC 4.36 3.87 - 5.11 MIL/uL   Hemoglobin 12.8 12.0 - 15.0 g/dL   HCT 60.4 54.0 - 98.1 %   MCV 92.0 80.0 - 100.0 fL   MCH 29.4 26.0 - 34.0 pg   MCHC 31.9 30.0 - 36.0 g/dL   RDW 19.1 47.8 - 29.5 %   Platelets 268 150 - 400 K/uL   nRBC 0.0 0.0 - 0.2 %    Neutrophils Relative % 47 %   Neutro Abs 2.6 1.7 - 7.7 K/uL   Lymphocytes Relative 43 %   Lymphs Abs 2.5 0.7 - 4.0 K/uL   Monocytes Relative 8 %   Monocytes Absolute 0.5 0.1 - 1.0 K/uL   Eosinophils Relative 1 %   Eosinophils Absolute 0.1 0.0 - 0.5 K/uL   Basophils Relative 1 %   Basophils Absolute 0.1 0.0 - 0.1 K/uL   Immature Granulocytes 0 %   Abs Immature Granulocytes 0.02 0.00 - 0.07 K/uL    Comment: Performed at [facility], [address] [place], [place] [address]  hCG, serum, qualitative     Status: None   Collection Time: 12/30/22  2:47 PM  Result Value Ref Range   Preg, Serum NEGATIVE NEGATIVE    Comment:        THE SENSITIVITY OF THIS METHODOLOGY IS >10 mIU/mL. Performed at [facility], [address], [place], [place] [address]    No results found.  Pending Labs Unresulted Labs (From admission, onward)     Start     Ordered   12/31/22 0500  CBC  Tomorrow morning,   R        12/30/22 1948   12/31/22 0500  Basic metabolic panel  Tomorrow morning,   R        12/30/22 1948            Vitals/Pain Today's Vitals   12/30/22 1358 12/30/22 1400 12/30/22 2042  BP: (!) 127/90    Pulse: 77    Resp: 16    Temp: 98.6 F (37 C)  98.3 F (36.8 C)  TempSrc: Oral  Oral  SpO2: 100%    Weight:  60 kg   Height:  5\' 8"  (1.727 m)   PainSc:  0-No pain     Isolation Precautions No active isolations  Medications Medications  enoxaparin (LOVENOX) injection 40 mg (has no administration in time range)  potassium chloride SA (KLOR-CON M) CR tablet 40 mEq (has no administration in time range)  acetaminophen (TYLENOL) tablet 650 mg (has no administration in time range)    Or  acetaminophen (TYLENOL) suppository 650 mg (has no administration in time range)  ondansetron (ZOFRAN) tablet 4 mg (has no administration in time range)    Or  ondansetron (ZOFRAN) injection 4 mg (has no administration in time range)  senna-docusate (Senokot-S) tablet 1 tablet  (has no administration in time range)  bisacodyl (DULCOLAX) EC tablet 5 mg (has no administration in time range)  acetaminophen (TYLENOL) tablet 650 mg (650 mg Oral Given 12/30/22 1446)  gadobutrol (GADAVIST) 1 MMOL/ML injection 6 mL (6 mLs Intravenous  Contrast Given 12/30/22 2212)    Mobility walks     Focused Assessments    R Recommendations: See Admitting Provider Note  Report given to:   Additional Notes: patient is currently in MRI

## 2022-12-30 NOTE — H&P (Signed)
History and Physical    Vaughan Etcitty GEX:[id number] DOB: 04/27/1995 DOA: 12/30/2022  PCP: Abner Greenspan, MD  Patient coming from: Home  I have personally briefly reviewed patient's old medical records in [facility]  Chief Complaint: Seizure-like activity  HPI: Ashley [name] is a 27 y.o. female with medical history significant for anxiety who presented to the ED for evaluation of seizure-like activity.  Patient was initially seen in the ED on 11/23 for seizure-like activity which occurred after having some drinks at the bar.  Per documentation she began to vomit on the drive home then looked at her friend and stated "I think I am going to have a seizure" and she began seizing for 5-10 minutes.  This involved whole body shaking.  EMS were called and patient was given 5 mg midazolam en route to the hospital.  CT head was negative.  She had returned to baseline and was referred to neurology.  Appointment scheduled for 03/13/2023.  Patient returns today with recurrent seizure-like activity.  This time she says she felt numbness in her right leg which radiated up to her arm and then felt weakness in her arm.  Afterwards she reportedly had another seizure-like event.  EMS were called and they witnessed seizure-like activity consisting of shaking of the lower extremities.  Patient was reportedly unconscious.  She does not recall either of these events.  No tongue biting or incontinence.  There was a reported postictal period.  Patient noted 1 previous similar episode in 2020.  She says the only medication she takes is Lexapro 10 mg, she recently started taking it in the morning instead of nighttime.  She denies any tobacco use.  She reports occasional alcohol use.  She is not aware of any history of epilepsy/seizures in her immediate family.  ED Course  Labs/Imaging on admission: I have personally reviewed following labs and imaging studies.  Initial vitals showed BP 127/90, pulse 77, RR 16,  temp 98.6 F, SpO2 100% on room air.  Labs show sodium 137, potassium 3.4, bicarb 22, BUN 10, creatinine 0.67, serum glucose 90, WBC 5.7, hemoglobin 12.8, platelets 268,000.  Serum hCG negative.  EDP discussed with on-call neurology Dr. Wilford Corner who recommended medical admission and neurology will see in consultation for further recommendations.  The hospital service was consulted to admit.  Review of Systems: All systems reviewed and are negative except as documented in history of present illness above.   Past Medical History:  Diagnosis Date   Concussion without loss of consciousness 12/02/2014    Past Surgical History:  Procedure Laterality Date   KNEE SURGERY Left 12/2019    Social History:  reports that she has never smoked. She has never used smokeless tobacco. She reports that she does not currently use alcohol. She reports that she does not use drugs.  Allergies  Allergen Reactions   Penicillins Anaphylaxis, Hives and Rash    Foamed from mouth Has patient had a PCN reaction causing immediate rash, facial/tongue/throat swelling, SOB or lightheadedness with hypotension: Yes Has patient had a PCN reaction causing severe rash involving mucus membranes or skin necrosis: No Has patient had a PCN reaction that required hospitalization No Has patient had a PCN reaction occurring within the last 10 years: No If all of the above answers are "NO", then may proceed with Cephalosporin use.     Family History  Problem Relation Age of Onset   Diabetes Mother    Hypertension Mother    Diabetes Sister  Hypertension Sister      Prior to Admission medications   Medication Sig Start Date End Date Taking? Authorizing Provider  benzonatate (TESSALON) 200 MG capsule Take 1 capsule (200 mg total) by mouth 3 (three) times daily as needed for cough. 12/16/22   Hermanns, Ashlee P, PA-C  fluticasone (FLONASE) 50 MCG/ACT nasal spray Place 1 spray into both nostrils daily as needed. 12/16/22    Hermanns, Ashlee P, PA-C  hydrOXYzine (ATARAX) 25 MG tablet Take by mouth. 10/02/22   [provider]  ibuprofen (ADVIL) 600 MG tablet Take 1 tablet (600 mg total) by mouth every 8 (eight) hours as needed for up to 30 doses for fever, headache, mild pain or moderate pain (Inflammation). Take 1 tablet 3 times daily as needed for inflammation of upper airways and/or pain. 11/12/22   Theadora Rama Scales, PA-C  medroxyPROGESTERone (DEPO-PROVERA) 150 MG/ML injection Inject 1 mL (150 mg total) into the muscle every 3 (three) months. 06/25/22   Brock Bad, MD  omeprazole (PRILOSEC) 20 MG capsule Take 1 capsule (20 mg total) by mouth daily. 10/23/22   Waldon Merl, PA-C  SYMBICORT 160-4.5 MCG/ACT inhaler SMARTSIG:2 Puff(s) By Mouth Twice Daily 07/17/21   [provider]  valACYclovir (VALTREX) 500 MG tablet TAKE AS DIRECTED FOR 3 DAYS AS NEEDED FOR EACH OUTBREAK 08/06/22   Constant, Gigi Gin, MD  VENTOLIN HFA 108 (90 Base) MCG/ACT inhaler SMARTSIG:1 Puff(s) By Mouth Every 4-6 Hours PRN 06/11/21   [provider]    Physical Exam: Vitals:   12/30/22 1358 12/30/22 1400 12/30/22 2042  BP: (!) 127/90    Pulse: 77    Resp: 16    Temp: 98.6 F (37 C)  98.3 F (36.8 C)  TempSrc: Oral  Oral  SpO2: 100%    Weight:  60 kg   Height:  5\' 8"  (1.727 m)    Constitutional: NAD, calm, comfortable Eyes: EOMI, lids and conjunctivae normal ENMT: Mucous membranes are moist. Posterior pharynx clear of any exudate or lesions.Normal dentition.  Neck: normal, supple, no masses. Respiratory: clear to auscultation bilaterally, no wheezing, no crackles. Normal respiratory effort. No accessory muscle use.  Cardiovascular: Regular rate and rhythm, no murmurs / rubs / gallops. No extremity edema. 2+ pedal pulses. Abdomen: no tenderness, no masses palpated. No hepatosplenomegaly. Bowel sounds positive.  Musculoskeletal: no clubbing / cyanosis. No joint deformity upper and lower extremities. Good  ROM, no contractures. Normal muscle tone.  Skin: no rashes, lesions, ulcers. No induration Neurologic: CN 2-12 grossly intact. Sensation intact. Strength 5/5 in all 4.  Psychiatric:  Alert and oriented x 3. Normal mood.   EKG: Personally reviewed. Sinus rhythm, rate 77, no acute ischemic changes.  Assessment/Plan Principal Problem:   Seizure-like activity (HCC) Active Problems:   Hypokalemia   Ashley [name] is a 27 y.o. female with medical history significant for anxiety who is admitted for evaluation of seizure-like activity per neurology.  Assessment and Plan: Seizure-like activity [facility]) Presenting with 2 seizure-like episodes on 11/23 and 11/25.  Neurology recommending admission for further workup and evaluation. -Obtain MRI brain w/wo -Routine EEG -Seizure precautions -Further recommendations as per neurology  Hypokalemia Oral supplement given.   DVT prophylaxis: enoxaparin (LOVENOX) injection 40 mg Start: 12/31/22 2000 Code Status: Full code Family Communication: Friend at bedside Disposition Plan: From home and likely discharge home pending clinical progress Consults called: Neurology Severity of Illness: The appropriate patient status for this patient is OBSERVATION. Observation status is judged to be reasonable and necessary  in order to provide the required intensity of service to ensure the patient's safety. The patient's presenting symptoms, physical exam findings, and initial radiographic and laboratory data in the context of their medical condition is felt to place them at decreased risk for further clinical deterioration. Furthermore, it is anticipated that the patient will be medically stable for discharge from the hospital within 2 midnights of admission.   Darreld Mclean MD [facility]  If 7PM-7AM, please contact night-coverage [email or web address]  12/30/2022, 9:09 PM

## 2022-12-30 NOTE — ED Provider Notes (Signed)
[place] EMERGENCY DEPARTMENT AT [facility] Provider Note   CSN: [id number] Arrival date & time: 12/30/22  1357     History  Chief Complaint  Patient presents with   Seizures    Ashley [name] is a 27 y.o. female.  HPI   27 year old female presents to the emergency department with concern for seizure-like activity.  Patient states today she started to feel like she was going to have a seizure.  This is when she developed numbness and tingling in her legs that progressed to her right upper extremity.  Following this she reportedly had a seizure.  EMS witnessed seizure-like activity which consisted of shaking of the lower extremities.  Patient was reportedly unconscious, she has amnesia of this event.  No tongue biting or incontinence.  There was a reported postictal period.  Of note patient had a similar episode about a few days ago and also reports an episode back in 2022.  She was referred to outpatient neurology but the earliest appointment that she could get is in February 2025.  Currently she feels generally fatigued with a mild headache.  Home Medications Prior to Admission medications   Medication Sig Start Date End Date Taking? Authorizing Provider  benzonatate (TESSALON) 200 MG capsule Take 1 capsule (200 mg total) by mouth 3 (three) times daily as needed for cough. 12/16/22   Hermanns, Ashlee P, PA-C  fluticasone (FLONASE) 50 MCG/ACT nasal spray Place 1 spray into both nostrils daily as needed. 12/16/22   Hermanns, Ashlee P, PA-C  hydrOXYzine (ATARAX) 25 MG tablet Take by mouth. 10/02/22   [provider]  ibuprofen (ADVIL) 600 MG tablet Take 1 tablet (600 mg total) by mouth every 8 (eight) hours as needed for up to 30 doses for fever, headache, mild pain or moderate pain (Inflammation). Take 1 tablet 3 times daily as needed for inflammation of upper airways and/or pain. 11/12/22   Theadora Rama Scales, PA-C  medroxyPROGESTERone (DEPO-PROVERA) 150 MG/ML  injection Inject 1 mL (150 mg total) into the muscle every 3 (three) months. 06/25/22   Brock Bad, MD  omeprazole (PRILOSEC) 20 MG capsule Take 1 capsule (20 mg total) by mouth daily. 10/23/22   Waldon Merl, PA-C  SYMBICORT 160-4.5 MCG/ACT inhaler SMARTSIG:2 Puff(s) By Mouth Twice Daily 07/17/21   [provider]  valACYclovir (VALTREX) 500 MG tablet TAKE AS DIRECTED FOR 3 DAYS AS NEEDED FOR EACH OUTBREAK 08/06/22   Constant, Peggy, MD  VENTOLIN HFA 108 (90 Base) MCG/ACT inhaler SMARTSIG:1 Puff(s) By Mouth Every 4-6 Hours PRN 06/11/21   [provider]      Allergies    Penicillins    Review of Systems   Review of Systems  Constitutional:  Negative for fever.  Respiratory:  Negative for shortness of breath.   Cardiovascular:  Negative for chest pain.  Gastrointestinal:  Negative for abdominal pain, diarrhea and vomiting.  Skin:  Negative for rash.  Neurological:  Positive for seizures and headaches.    Physical Exam Updated Vital Signs BP (!) 127/90   Pulse 77   Temp 98.6 F (37 C) (Oral)   Resp 16   Ht 5\' 8"  (1.727 m)   Wt 60 kg   SpO2 100%   BMI 20.11 kg/m  Physical Exam Vitals and nursing note reviewed.  Constitutional:      General: She is not in acute distress.    Appearance: Normal appearance. She is not ill-appearing.  HENT:     Head: Normocephalic.  Mouth/Throat:     Mouth: Mucous membranes are moist.     Comments: No tongue biting marks Cardiovascular:     Rate and Rhythm: Normal rate.  Pulmonary:     Effort: Pulmonary effort is normal. No respiratory distress.  Abdominal:     Palpations: Abdomen is soft.     Tenderness: There is no abdominal tenderness.  Musculoskeletal:        General: No swelling or deformity.     Cervical back: No rigidity.  Skin:    General: Skin is warm.  Neurological:     General: No focal deficit present.     Mental Status: She is alert and oriented to person, place, and time. Mental status is at  baseline.     Cranial Nerves: No cranial nerve deficit.     Motor: No weakness.  Psychiatric:        Mood and Affect: Mood normal.     ED Results / Procedures / Treatments   Labs (all labs ordered are listed, but only abnormal results are displayed) Labs Reviewed  BASIC METABOLIC PANEL - Abnormal; Notable for the following components:      Result Value   Potassium 3.4 (*)    All other components within normal limits  CBG MONITORING, ED - Abnormal; Notable for the following components:   Glucose-Capillary 104 (*)    All other components within normal limits  CBC WITH DIFFERENTIAL/PLATELET  HCG, SERUM, QUALITATIVE    EKG None  Radiology No results found.  Procedures Procedures    Medications Ordered in ED Medications  acetaminophen (TYLENOL) tablet 650 mg (650 mg Oral Given 12/30/22 1446)    ED Course/ Medical Decision Making/ A&P                                 Medical Decision Making  27 year old female presents emergency department after reported seizure-like activity.  Patient states she had the feeling like she was going to have a seizure and then does not remember anything until she was in the ambulance.  It is reported that she lost consciousness, had whole body shaking.  EMS witnessed the lower leg shaking.  There was no tongue biting or incontinence.  There is a reported postictal period.  Vital signs are stable on arrival.  She is back to neurologic baseline however feels fatigued and is complaining of a mild headache.  The earliest outpatient neurology follow-up she can get is February 2025.  Patient reveals that she had a seizure-like episode back in 2022 as well.  Had no outpatient neurology evaluation at that time.  I have concern for multiple seizure-like episodes with no reassurance for prompt outpatient neurology evaluation.  Patient is amendable to staying. Dr. Wilford Corner in agreements for hospitalist admission and further evaluation.  Blood work is baseline  for the patient.  She just had a head CT done 2 days ago at this previous episode.  EKG is sinus rhythm.  Patients evaluation and results requires admission for further treatment and care.  Spoke with hospitalist, reviewed patient's ED course and they accept admission.  Patient agrees with admission plan, offers no new complaints and is stable/unchanged at time of admit.        Final Clinical Impression(s) / ED Diagnoses Final diagnoses:  None    Rx / DC Orders ED Discharge Orders     None         Doloros Kwolek, Danford Bad  M, DO 12/30/22 1851

## 2022-12-30 NOTE — ED Provider Triage Note (Signed)
Emergency Medicine Provider Triage Evaluation Note  Ashley [name] , a 27 y.o. female  was evaluated in triage.  Pt complains of headache and tingling in hands.  Patient reports she woke up with headaches the past 4 days.  Additionally, she reports that she woke up with numbness and tingling in her hands and she "could not even hold a pencil."  Her symptoms have improved since onset.  She said that she had seizure-like activity per EMS prior to transport to the ED.  No additional complaints or concerns at this time.  Review of Systems  Positive: Headache, numbness and tingling Negative: Weakness, head injury  Physical Exam  BP (!) 127/90   Pulse 77   Temp 98.6 F (37 C) (Oral)   Resp 16   Ht 5\' 8"  (1.727 m)   Wt 60 kg   SpO2 100%   BMI 20.11 kg/m  Gen:   Awake, no distress   Resp:  Normal effort  MSK:   Moves extremities without difficulty  Other:    Medical Decision Making  Medically screening exam initiated at 2:45 PM.  Appropriate orders placed.  Dezerae Holle was informed that the remainder of the evaluation will be completed by another provider, this initial triage assessment does not replace that evaluation, and the importance of remaining in the ED until their evaluation is complete.   Maxwell Marion, PA-C 12/30/22 1447

## 2022-12-30 NOTE — ED Triage Notes (Signed)
Patient complaining of headache and numbness and tingling in all extremities so called EMS. Core and bilateral leg tremoring observed by EMS. Resistant to pupillary exam, no urinary incontinence, no oral trauma. Seen Friday for same and given outpatient referral to neurology, and the appointment is not until 2/6.

## 2022-12-30 NOTE — ED Notes (Signed)
Patient transported to MRI 

## 2022-12-30 NOTE — Progress Notes (Signed)
Pt off floor. Attempting eeg as schedule allows

## 2022-12-30 NOTE — Hospital Course (Signed)
Ashley [name] is a 27 y.o. female with medical history significant for anxiety who is admitted for evaluation of seizure-like activity per neurology.

## 2022-12-31 ENCOUNTER — Observation Stay (HOSPITAL_COMMUNITY): Payer: BC Managed Care – PPO

## 2022-12-31 DIAGNOSIS — R569 Unspecified convulsions: Secondary | ICD-10-CM | POA: Diagnosis not present

## 2022-12-31 LAB — BASIC METABOLIC PANEL
Anion gap: 5 (ref 5–15)
BUN: 12 mg/dL (ref 6–20)
CO2: 21 mmol/L — ABNORMAL LOW (ref 22–32)
Calcium: 8.5 mg/dL — ABNORMAL LOW (ref 8.9–10.3)
Chloride: 111 mmol/L (ref 98–111)
Creatinine, Ser: 0.68 mg/dL (ref 0.44–1.00)
GFR, Estimated: >60 mL/min (ref >60)
Glucose, Bld: 96 mg/dL (ref 70–99)
Potassium: 3.7 mmol/L (ref 3.5–5.1)
Sodium: 137 mmol/L (ref 135–145)

## 2022-12-31 LAB — CBC
HCT: 33.5 % — ABNORMAL LOW (ref 36.0–46.0)
Hemoglobin: 10.9 g/dL — ABNORMAL LOW (ref 12.0–15.0)
MCH: 29.1 pg (ref 26.0–34.0)
MCHC: 32.5 g/dL (ref 30.0–36.0)
MCV: 89.6 fL (ref 80.0–100.0)
Platelets: 242 10*3/uL (ref 150–400)
RBC: 3.74 MIL/uL — ABNORMAL LOW (ref 3.87–5.11)
RDW: 13 % (ref 11.5–15.5)
WBC: 5 10*3/uL (ref 4.0–10.5)
nRBC: 0 % (ref 0.0–0.2)

## 2022-12-31 LAB — VITAMIN B12: Vitamin B-12: 670 pg/mL (ref 180–914)

## 2022-12-31 NOTE — Progress Notes (Signed)
EEG complete - results pending 

## 2022-12-31 NOTE — Procedures (Signed)
Patient Name: Ashley [name]  MRN: [id number]  Epilepsy Attending: Charlsie Quest  Referring Physician/Provider: Charlsie Quest, MD  Date: 12/31/2022 Duration: 28.24 mins  Patient history: 27 y.o. female woman presenting with spells of altered awareness/behavior. EEG to evaluate for seizure  Level of alertness: Awake, asleep  AEDs during EEG study: None  Technical aspects: This EEG study was done with scalp electrodes positioned according to the 10-20 International system of electrode placement. Electrical activity was reviewed with band pass filter of 1-70Hz , sensitivity of 7 uV/mm, display speed of 67mm/sec with a 60Hz  notched filter applied as appropriate. EEG data were recorded continuously and digitally stored.  Video monitoring was available and reviewed as appropriate.  Description: The posterior dominant rhythm consists of 9-10 Hz activity of moderate voltage (25-35 uV) seen predominantly in posterior head regions, symmetric and reactive to eye opening and eye closing.  Sleep was characterized by vertex waves, sleep spindles (12 to 14 Hz), maximal frontocentral region. Hyperventilation did not show any EEG change. Physiologic photic driving was not seen during photic stimulation.    IMPRESSION: This study is within normal limits. No seizures or epileptiform discharges were seen throughout the recording.  A normal interictal EEG does not exclude the diagnosis of epilepsy.  [name]

## 2022-12-31 NOTE — Consult Note (Signed)
NEUROLOGY CONSULT NOTE   Date of service: December 31, 2022 Patient Name: Ashley [name] MRN:  [id number] DOB:  1995/10/31 Chief Complaint: "seizure" Requesting Provider: Charlsie Quest, MD  History of Present Illness  Ashley [name] is a 27 y.o. female  has a past medical history of Concussion without loss of consciousness (12/02/2014).     She reports she was talking with someone on FaceTime when she began to have her typical event described below. Otherwise she notes she changed her Lexapro dose timing from evening dose to mornings on Friday morning (skipping Thursday evening's dose).  She notes that she did have her episode on 11/23 in the setting of alcohol use.  She is unable to quantify the amount of alcohol consumed, denies frequent drinking other substances.  She notes chronic poor sleep secondary to her anxiety for which she recently started Lexapro 2 months ago.  She also notes that she started using Unisom 2 or 3 months ago and is currently using it 3-5 nights per week.  She denies any other recent concerns.  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 [place], and has a 72-year-old son  Spell #  - Semiology: slurred speech, moving slow, "talking but not really talking," shaking that is not clearly described - Prodome: bilateral leg numbness / paraesthesias / shakiness  - Post-spell: Confused, amnestic to a period of time - Triggers: Unclear - Frequency: 3rd lifetime event, prior event Fri 11/22, prior to that 2022 December  Risk factors:  Birth and development - premature by approximately 1 month per her recollection, not in the ICU and no other complications Febrile seizures in childhood: Negative Significant head trauma: 3 concussions mostly during cheerleading Intracranial surgeries: None Mengingitis/Encephalitis history: No Family history of seizures or developmental delay: No    ROS  Comprehensive ROS performed and pertinent  positives documented in HPI    Past History   Past Medical History:  Diagnosis Date   Concussion without loss of consciousness 12/02/2014    Past Surgical History:  Procedure Laterality Date   KNEE SURGERY Left 12/2019    Family History: Family History  Problem Relation Age of Onset   Diabetes Mother    Hypertension Mother    Diabetes Sister    Hypertension Sister     Social History  reports that she has never smoked. She has never used smokeless tobacco. She reports that she does not currently use alcohol. She reports that she does not use drugs.  Allergies  Allergen Reactions   Penicillins Anaphylaxis, Hives and Rash    Foamed from mouth Has patient had a PCN reaction causing immediate rash, facial/tongue/throat swelling, SOB or lightheadedness with hypotension: Yes Has patient had a PCN reaction causing severe rash involving mucus membranes or skin necrosis: No Has patient had a PCN reaction that required hospitalization No Has patient had a PCN reaction occurring within the last 10 years: No If all of the above answers are "NO", then may proceed with Cephalosporin use.     Medications   Current Facility-Administered Medications:    acetaminophen (TYLENOL) tablet 650 mg, 650 mg, Oral, Q6H PRN **OR** acetaminophen (TYLENOL) suppository 650 mg, 650 mg, Rectal, Q6H PRN, Allena Katz, Vishal R, MD   bisacodyl (DULCOLAX) EC tablet 5 mg, 5 mg, Oral, Daily PRN, Allena Katz, Vishal R, MD   enoxaparin (LOVENOX) injection 40 mg, 40 mg, Subcutaneous, Q24H, Patel, Vishal R, MD   ondansetron (ZOFRAN) tablet 4 mg, 4 mg, Oral,  Q6H PRN **OR** ondansetron (ZOFRAN) injection 4 mg, 4 mg, Intravenous, Q6H PRN, Allena Katz, Vishal R, MD   senna-docusate (Senokot-S) tablet 1 tablet, 1 tablet, Oral, QHS PRN, Charlsie Quest, MD  Vitals   Vitals:   12/30/22 1358 12/30/22 1400 12/30/22 2042 12/30/22 2250  BP: (!) 127/90   130/86  Pulse: 77   77  Resp: 16   17  Temp: 98.6 F (37 C)  98.3 F (36.8 C)  98.5 F (36.9 C)  TempSrc: Oral  Oral Oral  SpO2: 100%   99%  Weight:  60 kg    Height:  5\' 8"  (1.727 m)      Body mass index is 20.11 kg/m.  Physical Exam   Constitutional: Appears well-developed and well-nourished.  Psych: Affect appropriate to situation.  Eyes: No scleral injection.  HENT: No OP obstruction.  Head: Normocephalic.  Cardiovascular: Normal rate and regular rhythm.  Respiratory: Effort normal, non-labored breathing.  GI: Soft.  No distension. There is no tenderness.  Skin: WDI.   Neurologic Examination    Neuro: Mental Status: Patient is awake, alert, oriented to person, place, month, year, and situation. Patient is able to give a clear and coherent history. No signs of aphasia or neglect Cranial Nerves: II: Visual Fields are full. Pupils are equal, round, and reactive to light.   III,IV, VI: EOMI without ptosis or diploplia.  V: Facial sensation is symmetric to temperature VII: Facial movement is symmetric.  VIII: hearing is intact to voice X: Uvula elevates symmetrically XI: Shoulder shrug is symmetric. XII: tongue is midline without atrophy or fasciculations.  Motor: Tone is normal. Bulk is normal. 5/5 strength was present in all four extremities, intermittent giveaway weakness, especially in the right leg.  Sensory: Sensation is symmetric to light touch and temperature in the arms and legs. Deep Tendon Reflexes: 2+ and symmetric in the biceps, brachioradialis and patellae.  Cerebellar: FNF and HKS are intact bilaterally Gait: Normal casual gait.  Able to rise on heels and toes equally.  Able to squat steadily with equal use of bilateral legs   Labs/Imaging/Neurodiagnostic studies   CBC:  Recent Labs  Lab 01/13/23 0420 12/30/22 1447  WBC 6.6 5.7  NEUTROABS 4.5 2.6  HGB 12.3 12.8  HCT 36.9 40.1  MCV 89.8 92.0  PLT 277 268   Basic Metabolic Panel:  Lab Results  Component Value Date   NA 137 12/30/2022   K 3.4 (L) 12/30/2022   CO2  22 12/30/2022   GLUCOSE 90 12/30/2022   BUN 10 12/30/2022   CREATININE 0.67 12/30/2022   CALCIUM 9.5 12/30/2022   GFRNONAA >60 12/30/2022   GFRAA >60 07/29/2019   Lipid Panel:  Lab Results  Component Value Date   LDLCALC 226 (H) 11/26/2017   HgbA1c:  Lab Results  Component Value Date   HGBA1C 4.8 11/26/2017   Urine Drug Screen:     Component Value Date/Time   LABOPIA NONE DETECTED 01/13/23 0327   COCAINSCRNUR NONE DETECTED 2023/01/13 0327   LABBENZ POSITIVE (A) 2023-01-13 0327   AMPHETMU NONE DETECTED 01-13-23 0327   THCU NONE DETECTED 01-13-2023 0327   LABBARB NONE DETECTED 13-Jan-2023 0327    Alcohol Level     Component Value Date/Time   ETH 98 (H) January 13, 2023 0420   MRI Brain(Personally reviewed): Normal brain MRI. No acute intracranial abnormality.   Neurodiagnostics rEEG: Pending  ASSESSMENT   Aloura Symington is a 27 y.o. female woman presenting with spells of altered awareness/behavior.  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  However we will obtain EEG as additional screening.  MRI brain as well as exam reassuring (note right leg weakness on confrontational strength testing not reproduced on gait testing).  Fortunately she reports she already has a therapist and psychiatrist  RECOMMENDATIONS  -Routine EEG -If this is reassuring, continued outpatient follow-up with psychiatry -Neurology will follow-up EEG, otherwise we will sign off, please reach out if any additional questions or concerns arise ______________________________________________________________________    Signed, Gordy Councilman, MD [facility] Available 7 PM to 7 AM, outside of these hours please call Neurologist on call as listed on Amion.

## 2022-12-31 NOTE — TOC Transition Note (Signed)
Transition of Care (TOC) - CM/SW Discharge Note   Patient Details  Name: Ashley [name] MRN: [id number] Date of Birth: 1995-02-12  Transition of Care [facility]) CM/SW Contact:  Kermit Balo, RN Phone Number: 12/31/2022, 1:00 PM   Clinical Narrative:     Pt is discharging home with self care. No needs per TOC.   Final next level of care: Home/Self Care Barriers to Discharge: No Barriers Identified   Patient Goals and CMS Choice      Discharge Placement                         Discharge Plan and Services Additional resources added to the After Visit Summary for                                       Social Determinants of Health (SDOH) Interventions SDOH Screenings   Food Insecurity: No Food Insecurity (12/30/2022)  Housing: Low Risk  (12/30/2022)  Transportation Needs: No Transportation Needs (12/30/2022)  Utilities: Not At Risk (12/30/2022)  Alcohol Screen: Low Risk  (11/26/2017)  Depression (PHQ2-9): Low Risk  (08/16/2021)  Tobacco Use: Low Risk  (12/30/2022)     Readmission Risk Interventions     No data to display

## 2022-12-31 NOTE — Progress Notes (Signed)
Chaplain attempted to complete [facility] consult for Advance Directives, but pt had been discharged. It pt would like to complete Advance Directives outpatient, she may contact [phone] or [email or web address] for further information, to make an appointment to complete documents in person, or have them notarized and uploaded into the [facility].  Documents are also available online at [email or web address]  [employer] Carley Hammed, M.Div. [facility] Chaplain Pager [phone] Office [phone]

## 2022-12-31 NOTE — Discharge Summary (Signed)
Physician Discharge Summary  Ashley [name] WGN:[id number] DOB: 02/24/1995 DOA: 12/30/2022  PCP: Abner Greenspan, MD  Admit date: 12/30/2022 Discharge date: 12/31/2022  Time spent: 40 minutes  Recommendations for Outpatient Follow-up:  Follow outpatient CBC/CMP  Follow with neurology outpatient Follow with psychiatry outpatient    Discharge Diagnoses:  Principal Problem:   Seizure-like activity [facility]) Active Problems:   Hypokalemia   Discharge Condition: stable  Diet recommendation: heart healthy  Filed Weights   12/30/22 1400  Weight: 60 kg    History of present illness:  Ashley [name] is Ashley [name] 27 y.o. female with medical history significant for anxiety who is admitted for evaluation of seizure-like activity per neurology.  EEG within normal limits.  MRI brain normal.  Stable for discharge per neurology.   See below for additional details Hospital Course:  Assessment and Plan:  Seizure-like activity [facility]) Presenting with 2 seizure-like episodes on 11/23 and 11/25.  Neurology recommending admission for further workup and evaluation. -MRI brain normal  -Routine EEG within normal limits -Seizure precautions -appreciate neurology, recommending continued outpatient follow up with psychiatry.  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   Per [facility] statutes, patients with seizures are not allowed to drive until  they have been seizure-free for six months. Use caution when using heavy equipment or power tools. Avoid working on ladders or at heights. Take showers instead of baths. Ensure the water temperature is not too high on the home water heater. Do not go swimming alone. When caring for infants or small children, sit down when holding, feeding, or changing them to minimize risk of injury to the child in the event you have Darien Kading seizure. Also, Maintain good sleep hygiene. Avoid alcohol.  Depression  Anxiety - continue  home meds - follow as scheduled with psychiatry  Hypokalemia Oral supplement given.    Procedures: EEG IMPRESSION: This study is within normal limits. No seizures or epileptiform discharges were seen throughout the recording.   [name] normal interictal EEG does not exclude the diagnosis of epilepsy.   Consultations: neurology  Discharge Exam: Vitals:   12/31/22 0803 12/31/22 1140  BP: 129/85 118/78  Pulse: 73 68  Resp: 18 18  Temp: 99.1 F (37.3 C) 98.7 F (37.1 C)  SpO2: 94% 98%   No complaints Denies SI Mother at bedside  General: No acute distress. Cardiovascular: Heart sounds show Ashley [name] regular rate, and rhythm. Lungs: Clear to auscultation bilaterally  Neurological: Alert and oriented 3. Moves all extremities 4. Cranial nerves II through XII grossly intact. Extremities: No clubbing or cyanosis. No edema  Discharge Instructions   Discharge Instructions     Ambulatory referral to Neurology   Complete by: As directed    An appointment is requested in approximately: 4 weeks   Call MD for:  difficulty breathing, headache or visual disturbances   Complete by: As directed    Call MD for:  extreme fatigue   Complete by: As directed    Call MD for:  hives   Complete by: As directed    Call MD for:  persistant dizziness or light-headedness   Complete by: As directed    Call MD for:  persistant nausea and vomiting   Complete by: As directed    Call MD for:  redness, tenderness, or signs of infection (pain, swelling, redness, odor or green/yellow discharge around incision site)   Complete by: As directed    Call MD for:  severe uncontrolled pain  Complete by: As directed    Call MD for:  temperature >100.4   Complete by: As directed    Diet - low sodium heart healthy   Complete by: As directed    Discharge instructions   Complete by: As directed    You were seen for seizure like activity.    Your EEG did not show any seizure activity.  Your brain MRI was  normal.  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  Continue to follow up with your outpatient psychiatrist and therapist.  Follow up with neurology outpatient as scheduled.   Per [facility] statutes, patients with seizures are not allowed to drive until  they have been seizure-free for six months. Use caution when using heavy equipment or power tools. Avoid working on ladders or at heights. Take showers instead of baths. Ensure the water temperature is not too high on the home water heater. Do not go swimming alone. When caring for infants or small children, sit down when holding, feeding, or changing them to minimize risk of injury to the child in the event you have Ashley [name] seizure. Also, Maintain good sleep hygiene. Avoid alcohol.  While you work on establishing with [name] PCP locally, it will probably be helpful to see your PCP in [place] so they can help with any outstanding paperwork for work.    Return for new, recurrent, or worsening symptoms.  Please ask your PCP to request records from this hospitalization so they know what was done and what the next steps will be.   Increase activity slowly   Complete by: As directed       Allergies as of 12/31/2022       Reactions   Penicillins Anaphylaxis, Hives, Rash   Foamed from mouth Has patient had Shant Hence PCN reaction causing immediate rash, facial/tongue/throat swelling, SOB or lightheadedness with hypotension: Yes Has patient had Raheen Capili PCN reaction causing severe rash involving mucus membranes or skin necrosis: No Has patient had Bryelle Spiewak PCN reaction that required hospitalization No Has patient had Darren Caldron PCN reaction occurring within the last 10 years: No If all of the above answers are "NO", then may proceed with Cephalosporin use.        Medication List     TAKE these medications    benzonatate 200 MG capsule Commonly known as: TESSALON Take 1 capsule (200 mg total) by mouth 3 (three) times daily as  needed for cough.   buPROPion 150 MG 24 hr tablet Commonly known as: WELLBUTRIN XL Take 150 mg by mouth daily.   escitalopram 5 MG tablet Commonly known as: LEXAPRO Take 5 mg by mouth daily.   fluticasone 50 MCG/ACT nasal spray Commonly known as: FLONASE Place 1 spray into both nostrils daily as needed.   hydrOXYzine 25 MG tablet Commonly known as: ATARAX Take 25 mg by mouth every 8 (eight) hours as needed for anxiety.   ibuprofen 600 MG tablet Commonly known as: ADVIL Take 1 tablet (600 mg total) by mouth every 8 (eight) hours as needed for up to 30 doses for fever, headache, mild pain or moderate pain (Inflammation). Take 1 tablet 3 times daily as needed for inflammation of upper airways and/or pain.   medroxyPROGESTERone 150 MG/ML injection Commonly known as: DEPO-PROVERA Inject 1 mL (150 mg total) into the muscle every 3 (three) months.   valACYclovir 500 MG tablet Commonly known as: VALTREX TAKE AS DIRECTED FOR 3 DAYS AS NEEDED FOR EACH OUTBREAK What changed: See  the new instructions.       Allergies  Allergen Reactions   Penicillins Anaphylaxis, Hives and Rash    Foamed from mouth Has patient had Elmira Olkowski PCN reaction causing immediate rash, facial/tongue/throat swelling, SOB or lightheadedness with hypotension: Yes Has patient had Nesreen Albano PCN reaction causing severe rash involving mucus membranes or skin necrosis: No Has patient had Cedrica Brune PCN reaction that required hospitalization No Has patient had Agatha Duplechain PCN reaction occurring within the last 10 years: No If all of the above answers are "NO", then may proceed with Cephalosporin use.       The results of significant diagnostics from this hospitalization (including imaging, microbiology, ancillary and laboratory) are listed below for reference.    Significant Diagnostic Studies: EEG adult  Result Date: 2023/01/11 Charlsie Quest, MD     2023/01/11  9:45 AM Patient Name: Ashley [name] MRN: [id number] Epilepsy Attending:  Charlsie Quest Referring Physician/Provider: Charlsie Quest, MD Date: 01/11/23 Duration: 28.24 mins Patient history: 27 y.o. female woman presenting with spells of altered awareness/behavior. EEG to evaluate for seizure Level of alertness: Awake, asleep AEDs during EEG study: None Technical aspects: This EEG study was done with scalp electrodes positioned according to the 10-20 International system of electrode placement. Electrical activity was reviewed with band pass filter of 1-70Hz , sensitivity of 7 uV/mm, display speed of 34mm/sec with Ahja Martello 60Hz  notched filter applied as appropriate. EEG data were recorded continuously and digitally stored.  Video monitoring was available and reviewed as appropriate. Description: The posterior dominant rhythm consists of 9-10 Hz activity of moderate voltage (25-35 uV) seen predominantly in posterior head regions, symmetric and reactive to eye opening and eye closing.  Sleep was characterized by vertex waves, sleep spindles (12 to 14 Hz), maximal frontocentral region. Hyperventilation did not show any EEG change. Physiologic photic driving was not seen during photic stimulation.  IMPRESSION: This study is within normal limits. No seizures or epileptiform discharges were seen throughout the recording. Quientin Jent normal interictal EEG does not exclude the diagnosis of epilepsy. Charlsie Quest   MR BRAIN W WO CONTRAST  Result Date: 01-11-23 CLINICAL DATA:  Initial evaluation for new onset seizure. EXAM: MRI HEAD WITHOUT AND WITH CONTRAST TECHNIQUE: Multiplanar, multiecho pulse sequences of the brain and surrounding structures were obtained without and with intravenous contrast. CONTRAST:  6mL GADAVIST GADOBUTROL 1 MMOL/ML IV SOLN COMPARISON:  CT from 12/28/2022 FINDINGS: Brain: Cerebral volume within normal limits for age. No focal parenchymal signal abnormality. No abnormal foci of restricted diffusion to suggest acute or subacute ischemia. Gray-white matter differentiation well  maintained. No encephalomalacia to suggest chronic cortical infarction or other insult. No foci of susceptibility artifact indicative of acute or chronic intracranial blood products. No mass lesion, midline shift or mass effect. Ventricles normal in size and morphology without hydrocephalus. No extra-axial fluid collection. Pituitary gland and suprasellar region within normal limits. No intrinsic temporal lobe abnormality. No abnormal enhancement. Vascular: Major intracranial vascular flow voids are well maintained. Skull and upper cervical spine: Craniocervical junction within normal limits. Visualized upper cervical spine demonstrates no significant finding. Bone marrow signal intensity within normal limits. No scalp soft tissue abnormality. Sinuses/Orbits: Globes and orbital soft tissues are within normal limits. Paranasal sinuses are largely clear. No significant mastoid effusion. Other: None. IMPRESSION: Normal brain MRI. No acute intracranial abnormality. Electronically Signed   By: Rise Mu M.D.   On: 2023/01/11 00:54   CT Head Wo Contrast  Result Date: 12/28/2022 CLINICAL DATA:  Seizure, new onset,  no history of trauma. EXAM: CT HEAD WITHOUT CONTRAST TECHNIQUE: Contiguous axial images were obtained from the base of the skull through the vertex without intravenous contrast. RADIATION DOSE REDUCTION: This exam was performed according to the departmental dose-optimization program which includes automated exposure control, adjustment of the mA and/or kV according to patient size and/or use of iterative reconstruction technique. COMPARISON:  11/25/2017. FINDINGS: Brain: No acute intracranial hemorrhage, midline shift or mass effect. No extra-axial fluid collection. Gray-white matter differentiation is within normal limits. No hydrocephalus. Vascular: No hyperdense vessel or unexpected calcification. Skull: Normal. Negative for fracture or focal lesion. Sinuses/Orbits: There is partial  opacification of the ethmoid air cells on the right. No acute orbital abnormality. Other: None. IMPRESSION: No acute intracranial process. Electronically Signed   By: Thornell Sartorius M.D.   On: 12/28/2022 04:07    Microbiology: No results found for this or any previous visit (from the past 240 hour(s)).   Labs: Basic Metabolic Panel: Recent Labs  Lab 12/28/22 0420 12/30/22 1447 12/31/22 0453  NA 139 137 137  K 3.7 3.4* 3.7  CL 108 109 111  CO2 17* 22 21*  GLUCOSE 104* 90 96  BUN 8 10 12   CREATININE 0.67 0.67 0.68  CALCIUM 9.5 9.5 8.5*   Liver Function Tests: Recent Labs  Lab 12/28/22 0420  AST 29  ALT 25  ALKPHOS 61  BILITOT 0.8  PROT 7.7  ALBUMIN 4.3   No results for input(s): "LIPASE", "AMYLASE" in the last 168 hours. No results for input(s): "AMMONIA" in the last 168 hours. CBC: Recent Labs  Lab 12/28/22 0420 12/30/22 1447 12/31/22 0453  WBC 6.6 5.7 5.0  NEUTROABS 4.5 2.6  --   HGB 12.3 12.8 10.9*  HCT 36.9 40.1 33.5*  MCV 89.8 92.0 89.6  PLT 277 268 242   Cardiac Enzymes: No results for input(s): "CKTOTAL", "CKMB", "CKMBINDEX", "TROPONINI" in the last 168 hours. BNP: BNP (last 3 results) No results for input(s): "BNP" in the last 8760 hours.  ProBNP (last 3 results) No results for input(s): "PROBNP" in the last 8760 hours.  CBG: Recent Labs  Lab 12/28/22 0431 12/30/22 1402  GLUCAP 125* 104*       Signed:  Lacretia Nicks MD.  [facility] 12/31/2022, 12:42 PM

## 2022-12-31 NOTE — Plan of Care (Signed)
Please see full consult note by Dr. Iver Nestle from this morning. EEG has been completed-normal. Recommendations as listed in the full consult from this morning. Please reach back out with questions as needed   -- Milon Dikes, MD Neurologist [facility]

## 2023-01-07 DIAGNOSIS — E876 Hypokalemia: Secondary | ICD-10-CM | POA: Diagnosis not present

## 2023-01-07 LAB — VITAMIN B1: Vitamin B1 (Thiamine): 91.2 nmol/L (ref 66.5–200.0)

## 2023-01-13 ENCOUNTER — Encounter: Payer: Self-pay | Admitting: Neurology

## 2023-01-13 ENCOUNTER — Ambulatory Visit (INDEPENDENT_AMBULATORY_CARE_PROVIDER_SITE_OTHER): Payer: BC Managed Care – PPO | Admitting: Neurology

## 2023-01-13 VITALS — BP 113/65 | HR 79 | Ht 68.0 in | Wt 154.0 lb

## 2023-01-13 DIAGNOSIS — R569 Unspecified convulsions: Secondary | ICD-10-CM | POA: Diagnosis not present

## 2023-01-13 DIAGNOSIS — F419 Anxiety disorder, unspecified: Secondary | ICD-10-CM | POA: Diagnosis not present

## 2023-01-13 MED ORDER — LAMOTRIGINE 25 MG PO TABS: ORAL_TABLET | ORAL | 0 refills | Status: DC

## 2023-01-13 MED ORDER — LAMOTRIGINE 100 MG PO TABS: 100.0000 mg | ORAL_TABLET | Freq: Two times a day (BID) | ORAL | 11 refills | Status: DC

## 2023-01-13 NOTE — Patient Instructions (Signed)
Meds ordered this encounter  Medications  1 lamoTRIgine (LAMICTAL) 25 MG tablet    Sig: 1 twice a day x1wk 2 twice a day x 2nd wk 3 twice a day x3rd wk    Dispense:  84 tablet    Refill:  0  2 lamoTRIgine (LAMICTAL) 100 MG tablet    Sig: Take 1 tablet (100 mg total) by mouth 2 (two) times daily.    Dispense:  60 tablet    Refill:  11     No driving until seizure free for 6 months.

## 2023-01-13 NOTE — Progress Notes (Signed)
Chief Complaint  Patient presents with   New Patient (Initial Visit)    Rm12, mother present, internal ED referral for seizure like activity:last one 12/30/22    ASSESSMENT AND PLAN  Ashley [name] is a 27 y.o. female   Seizure-like activity Anxiety  Normal MRI of the brain with and without contrast, EEG  Discussed with patient, she is willing to try treatment, even though the history is not convincing of truly epileptic disorder, start lamotrigine 25 mg titrating to 100 mg twice a day  Repeat EEG  No driving until seizure-free for 6 months  Return To Clinic With NP In 6 Months Virtual visit   DIAGNOSTIC DATA (LABS, IMAGING, TESTING) - I reviewed patient records, labs, notes, testing and imaging myself where available.   MEDICAL HISTORY:  Ashley [name], is a 27 year old female seen in request by her primary care PA Baldo Ash for evaluation of seizure-like activity, she is accompanied by her mother at today's visit October 14, 2022  History is obtained from the patient and review of electronic medical records. I personally reviewed pertinent available imaging films in PACS.   PMHx of  Anxiety STD.  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, living with her 2 children age 35 and 4  She had multiple emergency room visit in 2024, treated for gonorrhea, abdominal pain, then 2 seizure-like activity in November 2024  The first episode was on December 28, 2022, late night on November 22 after few drinks, she began to vomit while driving back home, then at her home in bathroom, in the sitting position, she began to notice bilateral feet numbness, then lost consciousness per patient, her friend described seizure activity, per ER record, last 10 to 15 minutes, described as body shaking back-and-forth, patient woke up at emergency room,  but per emergency room record, she stopped seizure-like activity shortly after EMS arrived, she even got dressed before she went with EMS, but she has no recollection of the event, she was giving 5 mg of midazolam en route to ED, UDS was positive for benzo only  First seizure-like activity was in 2021, she was eating, talking with her friend on the phone, then went into body shaking, loss of consciousness,  Most recent episode was on November 25, she was sitting on the sofa FaceTime with her friends, suddenly felt right arm numbness, bilateral lower extremity numbness tingling, then become unresponsive, her friends called EMS, per ED description, EMS witnessed seizure-like activity, shaking of lower extremities, there was no tongue biting, incontinence, there was a possible postictal stage  MRI of the brain with without contrast EEG was normal Laboratory evaluation showed normal B12, BMP, CBC hemoglobin of 10.9, normal B1, alcohol level on November 23 was 98, UDS was positive for benzo,   PHYSICAL EXAM:   Vitals:   01/13/23 0856  BP: 113/65  Pulse: 79  Weight: 154 lb (69.9 kg)  Height: 5\' 8"  (1.727 m)   Not recorded     Body mass index is 23.42 kg/m.  PHYSICAL EXAMNIATION:  Gen: NAD, conversant, well nourised, well groomed                     Cardiovascular: Regular rate rhythm, no peripheral edema, warm, nontender. Eyes: Conjunctivae clear without exudates or hemorrhage Neck: Supple, no carotid bruits. Pulmonary: Clear to auscultation bilaterally   NEUROLOGICAL  EXAM:  MENTAL STATUS: Speech/cognition: Depressed looking young female, awake, alert, oriented to history taking and casual conversation CRANIAL NERVES: CN II: Visual fields are full to confrontation. Pupils are round equal and briskly reactive to light. CN III, IV, VI: extraocular movement are normal. No ptosis. CN V: Facial sensation is intact to light touch CN VII: Face is symmetric with normal eye closure  CN  VIII: Hearing is normal to causal conversation. CN IX, X: Phonation is normal. CN XI: Head turning and shoulder shrug are intact  MOTOR: There is no pronator drift of out-stretched arms. Muscle bulk and tone are normal. Muscle strength is normal.  REFLEXES: Reflexes are 2+ and symmetric at the biceps, triceps, knees, and ankles. Plantar responses are flexor.  SENSORY: Intact to light touch, pinprick and vibratory sensation are intact in fingers and toes.  COORDINATION: There is no trunk or limb dysmetria noted.  GAIT/STANCE: Posture is normal. Gait is steady with normal steps, base, arm swing, and turning. Heel and toe walking are normal. Tandem gait is normal.  Romberg is absent.  REVIEW OF SYSTEMS:  Full 14 system review of systems performed and notable only for as above All other review of systems were negative.   ALLERGIES: Allergies  Allergen Reactions   Penicillins Anaphylaxis, Hives and Rash    Foamed from mouth Has patient had a PCN reaction causing immediate rash, facial/tongue/throat swelling, SOB or lightheadedness with hypotension: Yes Has patient had a PCN reaction causing severe rash involving mucus membranes or skin necrosis: No Has patient had a PCN reaction that required hospitalization No Has patient had a PCN reaction occurring within the last 10 years: No If all of the above answers are "NO", then may proceed with Cephalosporin use.     HOME MEDICATIONS: Current Outpatient Medications  Medication Sig Dispense Refill   benzonatate (TESSALON) 200 MG capsule Take 1 capsule (200 mg total) by mouth 3 (three) times daily as needed for cough. 15 capsule 0   buPROPion (WELLBUTRIN XL) 150 MG 24 hr tablet Take 150 mg by mouth daily.     escitalopram (LEXAPRO) 5 MG tablet Take 5 mg by mouth daily.     fluticasone (FLONASE) 50 MCG/ACT nasal spray Place 1 spray into both nostrils daily as needed. 18.2 mL 0   hydrOXYzine (ATARAX) 25 MG tablet Take 25 mg by mouth  every 8 (eight) hours as needed for anxiety.     ibuprofen (ADVIL) 600 MG tablet Take 1 tablet (600 mg total) by mouth every 8 (eight) hours as needed for up to 30 doses for fever, headache, mild pain or moderate pain (Inflammation). Take 1 tablet 3 times daily as needed for inflammation of upper airways and/or pain. 30 tablet 0   medroxyPROGESTERone (DEPO-PROVERA) 150 MG/ML injection Inject 1 mL (150 mg total) into the muscle every 3 (three) months. 1 mL 4   valACYclovir (VALTREX) 500 MG tablet TAKE AS DIRECTED FOR 3 DAYS AS NEEDED FOR EACH OUTBREAK (Patient taking differently: Take 500 mg by mouth See admin instructions. Take 1 tablet once daily for 3 days as needed for outbreak) 90 tablet 3   Current Facility-Administered Medications  Medication Dose Route Frequency Provider Last Rate Last Admin   medroxyPROGESTERone (DEPO-PROVERA) injection 150 mg  150 mg Intramuscular Q90 days Warden Fillers, MD   150 mg at 09/18/22 0825    PAST MEDICAL HISTORY: Past Medical History:  Diagnosis Date   Concussion without loss of consciousness 12/02/2014    PAST SURGICAL HISTORY:  Past Surgical History:  Procedure Laterality Date   KNEE SURGERY Left 12/2019    FAMILY HISTORY: Family History  Problem Relation Age of Onset   Migraines Mother    Diabetes Mother    Hypertension Mother    Diabetes Sister    Hypertension Sister     SOCIAL HISTORY: Social History   Socioeconomic History   Marital status: Legally Separated    Spouse name: Not on file   Number of children: 2   Years of education: Not on file   Highest education level: Master's degree (e.g., MA, MS, MEng, MEd, MSW, MBA)  Occupational History   Not on file  Tobacco Use   Smoking status: Never   Smokeless tobacco: Never  Vaping Use   Vaping status: Never Used  Substance and Sexual Activity   Alcohol use: Yes    Alcohol/week: 1.0 standard drink of alcohol    Types: 1 Standard drinks or equivalent per week    Comment: occ    Drug use: No   Sexual activity: Yes    Partners: Male    Birth control/protection: Injection  Other Topics Concern   Not on file  Social History Narrative   Not on file   Social Determinants of Health   Financial Resource Strain: Not on file  Food Insecurity: No Food Insecurity (12/30/2022)   Hunger Vital Sign    Worried About Running Out of Food in the Last Year: Never true    Ran Out of Food in the Last Year: Never true  Transportation Needs: No Transportation Needs (12/30/2022)   PRAPARE - Administrator, Civil Service (Medical): No    Lack of Transportation (Non-Medical): No  Physical Activity: Not on file  Stress: Not on file  Social Connections: Not on file  Intimate Partner Violence: Not At Risk (12/30/2022)   Humiliation, Afraid, Rape, and Kick questionnaire    Fear of Current or Ex-Partner: No    Emotionally Abused: No    Physically Abused: No    Sexually Abused: No      Levert Feinstein, M.D. Ph.D.  [facility] [address] [place], [place] [address] Ph: [phone] Fax: [phone]  CC:  Lunette Stands, PA-C [address] [place],  [place] [address]  Abner Greenspan, MD

## 2023-02-10 ENCOUNTER — Telehealth: Payer: BC Managed Care – PPO

## 2023-02-10 DIAGNOSIS — R109 Unspecified abdominal pain: Secondary | ICD-10-CM

## 2023-02-10 DIAGNOSIS — N898 Other specified noninflammatory disorders of vagina: Secondary | ICD-10-CM

## 2023-02-11 NOTE — Progress Notes (Signed)
 Because of belly pain and yellow/green discharge warranting need for examination and testing, I feel your condition warrants further evaluation and I recommend that you be seen in a face to face visit.   NOTE: There will be NO CHARGE for this eVisit   If you are having a true medical emergency please call 911.      For an urgent face to face visit, [facility] has eight urgent care centers for your convenience:   NEW!! [facility] at [facility] Get Driving Directions [phone] [address] [place], [address]    [facility] at [facility] Get Driving Directions [phone] [address] [place], [place] [address]   [facility] Center [facility]) Get Driving Directions [phone] [address] [place], [place] [address]  [facility] Urgent Care Center [facility]) Get Driving Directions [phone] [address] [place],  [place]  [address]  [facility] Health Urgent Care Center [facility] - at [employer]  [phone] [place],  [place] [address]   [facility] Urgent Care at [facility] Get Driving Directions [phone] [address] [place], [place] [address]   [facility]gent Care at [facility] Get Driving Directions  [phone] [address] [place], [place] [address]   [facility]gent Care at [facility] Directions [phone] [address] [place], [place] [address]  Your MyChart E-visit questionnaire answers were reviewed by a board certified advanced clinical practitioner to complete your personal care plan based on your specific symptoms.  Thank you for using e-Visits.

## 2023-02-12 ENCOUNTER — Ambulatory Visit: Payer: Self-pay

## 2023-02-18 ENCOUNTER — Encounter: Payer: Self-pay | Admitting: Neurology

## 2023-02-18 ENCOUNTER — Ambulatory Visit (INDEPENDENT_AMBULATORY_CARE_PROVIDER_SITE_OTHER): Payer: Self-pay | Admitting: Neurology

## 2023-02-18 DIAGNOSIS — F419 Anxiety disorder, unspecified: Secondary | ICD-10-CM

## 2023-02-18 DIAGNOSIS — R569 Unspecified convulsions: Secondary | ICD-10-CM

## 2023-02-25 ENCOUNTER — Telehealth: Payer: Self-pay | Admitting: Family Medicine

## 2023-02-25 DIAGNOSIS — J111 Influenza due to unidentified influenza virus with other respiratory manifestations: Secondary | ICD-10-CM

## 2023-02-25 MED ORDER — OSELTAMIVIR PHOSPHATE 75 MG PO CAPS: 75.0000 mg | ORAL_CAPSULE | Freq: Two times a day (BID) | ORAL | 0 refills | Status: AC

## 2023-02-25 NOTE — Progress Notes (Signed)

## 2023-03-04 ENCOUNTER — Ambulatory Visit (INDEPENDENT_AMBULATORY_CARE_PROVIDER_SITE_OTHER): Payer: Self-pay

## 2023-03-04 VITALS — BP 112/74 | HR 86 | Ht 68.0 in | Wt 160.2 lb

## 2023-03-04 DIAGNOSIS — Z3042 Encounter for surveillance of injectable contraceptive: Secondary | ICD-10-CM

## 2023-03-04 MED ORDER — MEDROXYPROGESTERONE ACETATE 150 MG/ML IM SUSP
150.0000 mg | Freq: Once | INTRAMUSCULAR | Status: AC
  Administered 2023-03-04: 150 mg via INTRAMUSCULAR

## 2023-03-04 NOTE — Progress Notes (Signed)
Date last pap: 06-12-20. Last Depo-Provera: 12-09-22. Side Effects if any: N/A pt tolerated well. Serum HCG indicated? N/A Depo given in schedule . Depo-Provera 150 mg IM given by: Hope Pigeon, CMA in the [facility] per pt request. Next appointment due 4/15-4/29.

## 2023-03-04 NOTE — Progress Notes (Signed)
Patient was assessed and managed by nursing staff during this encounter. I have reviewed the chart and agree with the documentation and plan. I have also made any necessary editorial changes.  Warden Fillers, MD 03/04/2023 9:23 AM

## 2023-03-11 ENCOUNTER — Ambulatory Visit
Admission: EM | Admit: 2023-03-11 | Discharge: 2023-03-11 | Disposition: A | Discharge status: Home / Self Care | Payer: Self-pay | Attending: Family Medicine | Admitting: Family Medicine

## 2023-03-11 ENCOUNTER — Ambulatory Visit (INDEPENDENT_AMBULATORY_CARE_PROVIDER_SITE_OTHER): Payer: Self-pay

## 2023-03-11 ENCOUNTER — Telehealth: Payer: Self-pay | Admitting: Physician Assistant

## 2023-03-11 DIAGNOSIS — R051 Acute cough: Secondary | ICD-10-CM | POA: Insufficient documentation

## 2023-03-11 DIAGNOSIS — B349 Viral infection, unspecified: Secondary | ICD-10-CM | POA: Insufficient documentation

## 2023-03-11 DIAGNOSIS — J111 Influenza due to unidentified influenza virus with other respiratory manifestations: Secondary | ICD-10-CM | POA: Diagnosis not present

## 2023-03-11 DIAGNOSIS — R059 Cough, unspecified: Secondary | ICD-10-CM | POA: Diagnosis not present

## 2023-03-11 MED ORDER — BENZONATATE 100 MG PO CAPS: ORAL_CAPSULE | ORAL | 0 refills | Status: DC

## 2023-03-11 NOTE — Progress Notes (Signed)
 E-Visit for Cough   We are sorry that you are not feeling well.  Here is how we plan to help!  Based on your presentation I believe you most likely have A cough due to a virus.  This is called viral bronchitis and is best treated by rest, plenty of fluids and control of the cough.  You may use Ibuprofen  or Tylenol  as directed to help your symptoms.     In addition you may use A prescription cough medication called Tessalon  Perles 100mg . You may take 1-2 capsules every 8 hours as needed for your cough.   From your responses in the eVisit questionnaire you describe inflammation in the upper respiratory tract which is causing a significant cough.  This is commonly called Bronchitis and has four common causes:   Allergies Viral Infections Acid Reflux Bacterial Infection Allergies, viruses and acid reflux are treated by controlling symptoms or eliminating the cause. An example might be a cough caused by taking certain blood pressure medications. You stop the cough by changing the medication. Another example might be a cough caused by acid reflux. Controlling the reflux helps control the cough.  USE OF BRONCHODILATOR (RESCUE) INHALERS: There is a risk from using your bronchodilator too frequently.  The risk is that over-reliance on a medication which only relaxes the muscles surrounding the breathing tubes can reduce the effectiveness of medications prescribed to reduce swelling and congestion of the tubes themselves.  Although you feel brief relief from the bronchodilator inhaler, your asthma may actually be worsening with the tubes becoming more swollen and filled with mucus.  This can delay other crucial treatments, such as oral steroid medications. If you need to use a bronchodilator inhaler daily, several times per day, you should discuss this with your provider.  There are probably better treatments that could be used to keep your asthma under control.     HOME CARE Only take medications as  instructed by your medical team. Complete the entire course of an antibiotic. Drink plenty of fluids and get plenty of rest. Avoid close contacts especially the very young and the elderly Cover your mouth if you cough or cough into your sleeve. Always remember to wash your hands A steam or ultrasonic humidifier can help congestion.   GET HELP RIGHT AWAY IF: You develop worsening fever. You become short of breath You cough up blood. Your symptoms persist after you have completed your treatment plan MAKE SURE YOU  Understand these instructions. Will watch your condition. Will get help right away if you are not doing well or get worse.    Thank you for choosing an e-visit.  Your e-visit answers were reviewed by a board certified advanced clinical practitioner to complete your personal care plan. Depending upon the condition, your plan could have included both over the counter or prescription medications.  Please review your pharmacy choice. Make sure the pharmacy is open so you can pick up prescription now. If there is a problem, you may contact your provider through [employer] and have the prescription routed to another pharmacy.  Your safety is important to us . If you have drug allergies check your prescription carefully.   For the next 24 hours you can use MyChart to ask questions about today's visit, request a non-urgent call back, or ask for a work or school excuse. You will get an email in the next two days asking about your experience. I hope that your e-visit has been valuable and will speed your recovery.  I have spent 5 minutes in review of e-visit questionnaire, review and updating patient chart, medical decision making and response to patient.   Kirk RAMAN Mayers, PA-C

## 2023-03-11 NOTE — ED Triage Notes (Addendum)
Pt presents to UC for c/o fullness in chest, cough, hoarseness x4 days. Pt had the flu about 2 weeks ago, finished tamiflu. Taking theraflu

## 2023-03-11 NOTE — Discharge Instructions (Signed)
 Your symptoms and exam are consistent for a viral illness.  The clinic will contact you with results of the COVID test done today if positive.  Please treat your symptoms with over the counter cough medication, tylenol or ibuprofen, humidifier, and rest. Viral illnesses can last 7-14 days. Please follow up with your PCP if your symptoms are not improving. Please go to the ER for any worsening symptoms. This includes but is not limited to fever you can not control with tylenol or ibuprofen, you are not able to stay hydrated, you have shortness of breath or chest pain.  Thank you for choosing [facility] for your healthcare needs. I hope you feel better soon!

## 2023-03-11 NOTE — ED Provider Notes (Signed)
 [facility]    CSN: [id number] Arrival date & time: 03/11/23  9187      History   Chief Complaint No chief complaint on file.   HPI Ashley [name] is a 28 y.o. female  presents for evaluation of URI symptoms for 4 days. Patient reports associated symptoms of cough, congestion, hoarseness of her voice, chest pain with coughing, sore throat with coughing only. Denies N/V/D, fevers, ear pain, body aches, shortness of breath. Patient does not have a hx of asthma. Patient is an active smoker.   Reports no sick contacts.  Pt has taken TheraFlu OTC for symptoms.  Patient dates she had influenza on January 21 and the symptoms completely resolved.  She also did a e-visit this morning for cough and was diagnosed with bronchitis and sent a prescription for Tessalon . Pt has no other concerns at this time.   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 Authorizing Provider  medroxyPROGESTERone  (DEPO-PROVERA ) 150 MG/ML injection Inject 1 mL (150 mg total) into the muscle every 3 (three) months. 06/25/22  Yes Rudy Carlin LABOR, MD  benzonatate  (TESSALON ) 100 MG capsule Take 1-2 caps PO TID PRN 03/11/23   Mayers, Cari S,  PA-C  buPROPion (WELLBUTRIN XL) 150 MG 24 hr tablet Take 150 mg by mouth daily. Patient not taking: Reported on 03/04/2023 12/25/22   [provider]  escitalopram (LEXAPRO) 5 MG tablet Take 5 mg by mouth daily. Patient not taking: Reported on 03/04/2023 12/25/22   [provider]  fluticasone  (FLONASE ) 50 MCG/ACT nasal spray Place 1 spray into both nostrils daily as needed. Patient not taking: Reported on 03/04/2023 12/16/22   Hermanns, Ashlee P, PA-C  hydrOXYzine  (ATARAX ) 25 MG tablet Take 25 mg by mouth every 8 (eight) hours as needed for anxiety. Patient not taking: Reported on 03/04/2023 10/02/22   [provider]  ibuprofen  (ADVIL ) 600 MG tablet Take 1 tablet (600 mg total) by mouth every 8 (eight) hours as needed for up to 30 doses for fever, headache, mild pain or moderate pain (Inflammation). Take 1 tablet 3 times daily as needed for inflammation of upper airways and/or pain. Patient not taking: Reported on 03/04/2023 11/12/22   Joesph Shaver Scales, PA-C  lamoTRIgine  (LAMICTAL ) 100 MG tablet Take 1 tablet (100 mg total) by mouth 2 (two) times daily. Patient not taking: Reported on 03/04/2023 01/13/23   Onita Duos, MD  lamoTRIgine  (LAMICTAL ) 25 MG tablet 1  twice a day x1wk 2 twice a day x 2nd wk 3 twice a day x3rd wk Patient not taking: Reported on 03/04/2023 01/13/23   Onita Duos, MD  valACYclovir  (VALTREX ) 500 MG tablet TAKE AS DIRECTED FOR 3 DAYS AS NEEDED FOR EACH OUTBREAK 08/06/22   Constant, Winton, MD    Family History Family History  Problem Relation Age of Onset   Migraines Mother    Diabetes Mother    Hypertension Mother    Diabetes Sister    Hypertension Sister     Social History Social History   Tobacco Use   Smoking status: Never   Smokeless tobacco: Never  Vaping Use   Vaping status: Never Used  Substance Use Topics   Alcohol use: Yes    Alcohol/week: 1.0 standard drink of alcohol    Types: 1 Standard drinks or equivalent per week     Comment: occ   Drug use: No     Allergies   Penicillins   Review of Systems Review of Systems  HENT:  Positive for congestion.   Respiratory:  Positive for cough.      Physical Exam Triage Vital Signs ED Triage Vitals  Encounter Vitals Group     BP 03/11/23 0905 111/77     Systolic BP Percentile --      Diastolic BP Percentile --      Pulse Rate 03/11/23 0905 72     Resp 03/11/23 0905 16     Temp 03/11/23 0905 99 F (37.2 C)     Temp Source 03/11/23 0905 Oral     SpO2 03/11/23 0905 98 %     Weight --      Height --      Head Circumference --      Peak Flow --      Pain Score 03/11/23 0902 0     Pain Loc --      Pain Education --      Exclude from Growth Chart --    No data found.  Updated Vital Signs BP 111/77 (BP Location: Left Arm)   Pulse 72   Temp 99 F (37.2 C) (Oral)   Resp 16   SpO2 98%   Visual Acuity Right Eye Distance:   Left Eye Distance:   Bilateral Distance:    Right Eye Near:   Left Eye Near:    Bilateral Near:     Physical Exam Vitals and nursing note reviewed.  Constitutional:      General: She is not in acute distress.    Appearance: She is well-developed. She is not ill-appearing.  HENT:     Head: Normocephalic and atraumatic.     Right Ear: Tympanic membrane and ear canal normal.     Left Ear: Tympanic membrane and ear canal normal.     Nose: Congestion present.     Mouth/Throat:     Mouth: Mucous membranes are moist.     Pharynx: Oropharynx is clear. Uvula midline. No oropharyngeal exudate or posterior oropharyngeal erythema.     Tonsils: No tonsillar exudate or tonsillar abscesses.  Eyes:     Conjunctiva/sclera: Conjunctivae normal.     Pupils: Pupils are equal, round, and reactive to light.  Cardiovascular:     Rate and Rhythm: Normal rate and regular rhythm.     Heart sounds: Normal heart sounds.  Pulmonary:     Effort: Pulmonary effort is normal.     Breath sounds: Normal breath sounds. No wheezing or rhonchi.  Musculoskeletal:     Cervical back: Normal range of motion and neck supple.  Lymphadenopathy:     Cervical: No cervical adenopathy.  Skin:    General: Skin is warm and dry.  Neurological:     General: No focal deficit present.     Mental Status: She is alert and oriented to person, place, and time.  Psychiatric:        Mood and Affect: Mood normal.        Behavior: Behavior normal.      UC Treatments / Results  Labs (all labs ordered are listed, but only abnormal results are displayed) Labs Reviewed  SARS CORONAVIRUS 2 (TAT 6-24 HRS)    EKG   Radiology No results found.  Procedures Procedures (including critical care time)  Medications Ordered in UC Medications - No data to display  Initial Impression / Assessment and Plan / UC Course  I have reviewed the triage vital signs and the nursing notes.  Pertinent labs & imaging results that were available during my care of the patient were reviewed by me and considered in my medical decision making (see chart for details).     Reviewed exam and symptoms with patient.  No red flags.  Wet read of x-ray without obvious consolidation, will contact for any positive results based on radiology overread.  COVID PCR and will contact if positive.  Discussed viral illness and symptomatic treatment.  Patient will pick up the prescription for Tessalon  that was prescribed to her earlier today via her e-visit.  Advised PCP follow-up 2 to 3 days for recheck.  ER precautions reviewed. Final Clinical Impressions(s) / UC Diagnoses   Final diagnoses:  Acute cough  Viral illness     Discharge Instructions      Your symptoms and exam are consistent for a viral illness.  The clinic will contact you with results of the COVID test done today if positive.  Please treat your symptoms with over the counter cough medication, tylenol  or ibuprofen , humidifier, and rest. Viral illnesses can last 7-14 days. Please follow up with your PCP if your  symptoms are not improving. Please go to the ER for any worsening symptoms. This includes but is not limited to fever you can not control with tylenol  or ibuprofen , you are not able to stay hydrated, you have shortness of breath or chest pain.  Thank you for choosing [facility] for your healthcare needs. I hope you feel better soon!      ED Prescriptions   None    PDMP not reviewed this encounter.   Loreda Myla SAUNDERS, NP 03/11/23 [phone]

## 2023-03-12 LAB — SARS CORONAVIRUS 2 (TAT 6-24 HRS): SARS Coronavirus 2: NEGATIVE

## 2023-03-13 ENCOUNTER — Ambulatory Visit: Payer: BC Managed Care – PPO | Admitting: Neurology

## 2023-03-24 NOTE — Procedures (Signed)
   HISTORY: 26 old female with seizure-like activity  TECHNIQUE:  This is a routine 16 channel EEG recording with one channel devoted to a limited EKG recording.  It was performed during wakefulness, drowsiness and asleep.  Hyperventilation and photic stimulation were performed as activating procedures.  There are minimum muscle and movement artifact noted.  Upon maximum arousal, posterior dominant waking rhythm consistent of rhythmic alpha range activity. Activities are symmetric over the bilateral posterior derivations and attenuated with eye opening.  Photic stimulation did not alter the tracing.  Hyperventilation produced mild/moderate buildup with higher amplitude and the slower activities noted.  During EEG recording, patient developed drowsiness and entered sleep, sleep EEG demonstrated architecture, there were frontal centrally dominant vertex waves and symmetric sleep spindles noted.  During EEG recording, there was no epileptiform discharge noted.  EKG demonstrate normal sinus rhythm.  CONCLUSION: This is a  normal awake and asleep EEG.  There is no electrodiagnostic evidence of epileptiform discharge.  Diyana Starrett, M.D. Ph.D.  [facility] [address] [place], [place] [address] Phone: [phone] Fax:      [phone]

## 2023-03-25 ENCOUNTER — Encounter: Payer: Self-pay | Admitting: Neurology

## 2023-04-14 ENCOUNTER — Telehealth: Payer: Self-pay | Admitting: Nurse Practitioner

## 2023-04-14 DIAGNOSIS — M62838 Other muscle spasm: Secondary | ICD-10-CM

## 2023-04-14 MED ORDER — CYCLOBENZAPRINE HCL 10 MG PO TABS
10.0000 mg | ORAL_TABLET | Freq: Three times a day (TID) | ORAL | 0 refills | Status: DC | PRN
Start: 2023-04-14 — End: 2023-08-18

## 2023-04-14 NOTE — Progress Notes (Signed)

## 2023-05-05 ENCOUNTER — Telehealth: Payer: Self-pay | Admitting: Physician Assistant

## 2023-05-05 DIAGNOSIS — J301 Allergic rhinitis due to pollen: Secondary | ICD-10-CM

## 2023-05-05 MED ORDER — CETIRIZINE HCL 10 MG PO TABS: 10.0000 mg | ORAL_TABLET | Freq: Every day | ORAL | 0 refills | Status: DC

## 2023-05-05 MED ORDER — FLUTICASONE PROPIONATE 50 MCG/ACT NA SUSP: 2.0000 | Freq: Every day | NASAL | 0 refills | Status: DC

## 2023-05-05 NOTE — Progress Notes (Signed)
 E visit for Allergic Rhinitis We are sorry that you are not feeling well.  Here is how we plan to help!  Based on what you have shared with me it looks like you have Allergic Rhinitis.  Rhinitis is when a reaction occurs that causes nasal congestion, runny nose, sneezing, and itching.  Most types of rhinitis are caused by an inflammation and are associated with symptoms in the eyes ears or throat. There are several types of rhinitis.  The most common are acute rhinitis, which is usually caused by a viral illness, allergic or seasonal rhinitis, and nonallergic or year-round rhinitis.  Nasal allergies occur certain times of the year.  Allergic rhinitis is caused when allergens in the air trigger the release of histamine in the body.  Histamine causes itching, swelling, and fluid to build up in the fragile linings of the nasal passages, sinuses and eyelids.  An itchy nose and clear discharge are common.  I recommend the following over the counter treatments: You should take a daily dose of antihistamine. I have prescribed Cetirizine 10mg  to take one tablet daily  I also would recommend a nasal spray: I have prescribed Flonase 2 sprays into each nostril once daily for up to 30 days   HOME CARE:  You can use an over-the-counter saline nasal spray as needed Avoid areas where there is heavy dust, mites, or molds Stay indoors on windy days during the pollen season Keep windows closed in home, at least in bedroom; use air conditioner. Use high-efficiency house air filter Keep windows closed in car, turn AC on re-circulate Avoid playing out with dog during pollen season  GET HELP RIGHT AWAY IF:  If your symptoms do not improve within 10 days You become short of breath You develop yellow or green discharge from your nose for over 3 days You have coughing fits  MAKE SURE YOU:  Understand these instructions Will watch your condition Will get help right away if you are not doing well or get  worse  Thank you for choosing an e-visit. Your e-visit answers were reviewed by a board certified advanced clinical practitioner to complete your personal care plan. Depending upon the condition, your plan could have included both over the counter or prescription medications. Please review your pharmacy choice. Be sure that the pharmacy you have chosen is open so that you can pick up your prescription now.  If there is a problem you may message your provider in MyChart to have the prescription routed to another pharmacy. Your safety is important to [place]. If you have drug allergies check your prescription carefully.  For the next 24 hours, you can use MyChart to ask questions about today's visit, request a non-urgent call back, or ask for a work or school excuse from your e-visit provider. You will get an email in the next two days asking about your experience. I hope that your e-visit has been valuable and will speed your recovery.       I have spent 5 minutes in review of e-visit questionnaire, review and updating patient chart, medical decision making and response to patient.   Margaretann Loveless, PA-C

## 2023-05-26 ENCOUNTER — Ambulatory Visit
Admission: RE | Admit: 2023-05-26 | Discharge: 2023-05-26 | Disposition: A | Discharge status: Home / Self Care | Payer: Self-pay | Source: Ambulatory Visit | Attending: Family Medicine | Admitting: Family Medicine

## 2023-05-26 VITALS — BP 119/83 | HR 71 | Temp 98.2°F | Resp 16

## 2023-05-26 DIAGNOSIS — N760 Acute vaginitis: Secondary | ICD-10-CM | POA: Insufficient documentation

## 2023-05-26 NOTE — ED Provider Notes (Signed)
 [facility]  Note:  This document was prepared using Conservation officer, historic buildings and may include unintentional dictation errors.  MRN: [id number] DOB: 1995-04-26  Subjective:   Natalya Sherelle Castelli is a 28 y.o. female presenting for 2 day history of vaginal itching, irritation.  Feels like she can have a yeast infection.  Denies fever, n/v, abdominal pain, pelvic pain, rashes, dysuria, urinary frequency, hematuria, vaginal discharge.  Would also like STI testing.  Has no concern for pregnancy, is on Depo shots.  Has irregular cycles.   Current Facility-Administered Medications:    medroxyPROGESTERone  (DEPO-PROVERA ) injection 150 mg, 150 mg, Intramuscular, Q90 days, Abigail Abler, MD, 150 mg at 09/18/22 0825  Current Outpatient Medications:    benzonatate  (TESSALON ) 100 MG capsule, Take 1-2 caps PO TID PRN, Disp: 20 capsule, Rfl: 0   cetirizine  (ZYRTEC ) 10 MG tablet, Take 1 tablet (10 mg total) by mouth daily., Disp: 30 tablet, Rfl: 0   cyclobenzaprine  (FLEXERIL ) 10 MG tablet, Take 1 tablet (10 mg total) by mouth 3 (three) times daily as needed for muscle spasms., Disp: 30 tablet, Rfl: 0   fluticasone  (FLONASE ) 50 MCG/ACT nasal spray, Place 2 sprays into both nostrils daily., Disp: 16 g, Rfl: 0   ibuprofen  (ADVIL ) 600 MG tablet, Take 1 tablet (600 mg total) by mouth every 8 (eight) hours as needed for up to 30 doses for fever, headache, mild pain or moderate pain (Inflammation). Take 1 tablet 3 times daily as needed for inflammation of upper airways and/or pain. (Patient not taking: Reported on 03/04/2023), Disp: 30 tablet, Rfl: 0   medroxyPROGESTERone  (DEPO-PROVERA ) 150 MG/ML injection, Inject 1 mL (150 mg total) into the muscle every 3 (three) months., Disp: 1 mL, Rfl: 4   valACYclovir  (VALTREX ) 500 MG tablet, TAKE AS DIRECTED FOR 3 DAYS AS NEEDED FOR EACH OUTBREAK, Disp: 90 tablet, Rfl: 3   Allergies  Allergen Reactions   Penicillins  Anaphylaxis, Hives and Rash    Foamed from mouth Has patient had a PCN reaction causing immediate rash, facial/tongue/throat swelling, SOB or lightheadedness with hypotension: Yes Has patient had a PCN reaction causing severe rash involving mucus membranes or skin necrosis: No Has patient had a PCN reaction that required hospitalization No Has patient had a PCN reaction occurring within the last 10 years: No If all of the above answers are "NO", then may proceed with Cephalosporin use.     Past Medical History:  Diagnosis Date   Concussion without loss of consciousness 12/02/2014     Past Surgical History:  Procedure Laterality Date   KNEE SURGERY Left 12/2019    Family History  Problem Relation Age of Onset   Migraines Mother    Diabetes Mother    Hypertension Mother    Diabetes Sister    Hypertension Sister     Social History   Tobacco Use   Smoking status: Never   Smokeless tobacco: Never  Vaping Use   Vaping status: Never Used  Substance Use Topics   Alcohol use: Yes    Alcohol/week: 1.0 standard drink of alcohol    Types: 1 Standard drinks or equivalent per week    Comment: occ   Drug use: No    ROS   Objective:   Vitals: BP 119/83 (BP Location: Right Arm)   Pulse 71   Temp 98.2 F (36.8 C) (Oral)   Resp 16   SpO2 97%   Physical Exam Constitutional:      General: She  is not in acute distress.    Appearance: Normal appearance. She is well-developed. She is not ill-appearing, toxic-appearing or diaphoretic.  HENT:     Head: Normocephalic and atraumatic.     Nose: Nose normal.     Mouth/Throat:     Mouth: Mucous membranes are moist.  Eyes:     General: No scleral icterus.       Right eye: No discharge.        Left eye: No discharge.     Extraocular Movements: Extraocular movements intact.  Cardiovascular:     Rate and Rhythm: Normal rate.  Pulmonary:     Effort: Pulmonary effort is normal.  Skin:    General: Skin is warm and dry.   Neurological:     General: No focal deficit present.     Mental Status: She is alert and oriented to person, place, and time.  Psychiatric:        Mood and Affect: Mood normal.        Behavior: Behavior normal.     Assessment and Plan :   PDMP not reviewed this encounter.  1. Acute vaginitis    Patient declined empiric treatment.  Will base treatment off her results.   Adolph Hoop, [place] 05/26/23 [phone]

## 2023-05-26 NOTE — ED Triage Notes (Signed)
 Pt c/o vaginal itching  x 1-2 days that "feels like a yeast infection"-also requesting STD testing as well-NAD-steady gait

## 2023-05-26 NOTE — Discharge Instructions (Signed)
 We will update you with your test results tomorrow.  If you have not heard from our results nurse by noon, feel free to call our clinic and we will look up your results.

## 2023-05-27 ENCOUNTER — Ambulatory Visit: Payer: Self-pay

## 2023-05-27 LAB — CERVICOVAGINAL ANCILLARY ONLY
Bacterial Vaginitis (gardnerella): NEGATIVE
Candida Glabrata: NEGATIVE
Candida Vaginitis: NEGATIVE
Chlamydia: NEGATIVE
Comment: NEGATIVE
Comment: NEGATIVE
Comment: NEGATIVE
Comment: NEGATIVE
Comment: NEGATIVE
Comment: NORMAL
Neisseria Gonorrhea: NEGATIVE
Trichomonas: NEGATIVE

## 2023-06-11 DIAGNOSIS — Z6824 Body mass index (BMI) 24.0-24.9, adult: Secondary | ICD-10-CM | POA: Diagnosis not present

## 2023-06-11 DIAGNOSIS — Z01818 Encounter for other preprocedural examination: Secondary | ICD-10-CM | POA: Diagnosis not present

## 2023-06-11 DIAGNOSIS — R4689 Other symptoms and signs involving appearance and behavior: Secondary | ICD-10-CM | POA: Diagnosis not present

## 2023-06-16 IMAGING — US US PELVIS COMPLETE WITH TRANSVAGINAL
1 series · 13 of 25 positions shown · non-contrast
Comparison: August 10, 2019

CLINICAL DATA: Heavy, irregular vaginal bleeding.



[Series 1: us pelvic complete with transvaginal · 69 acquisitions, 13 frames shown]
[im 1/69]
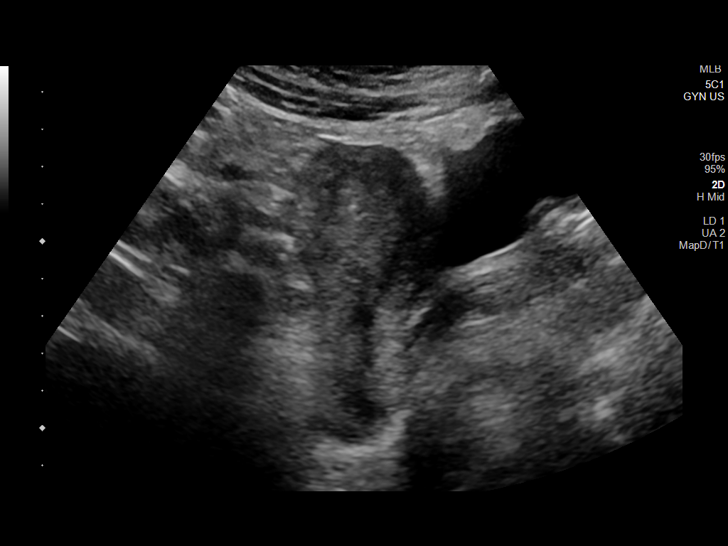
[im 6/69]
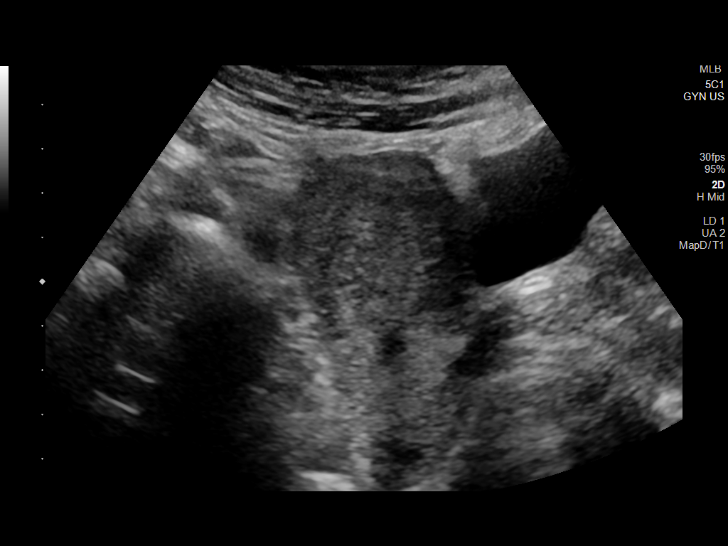
[im 12/69]
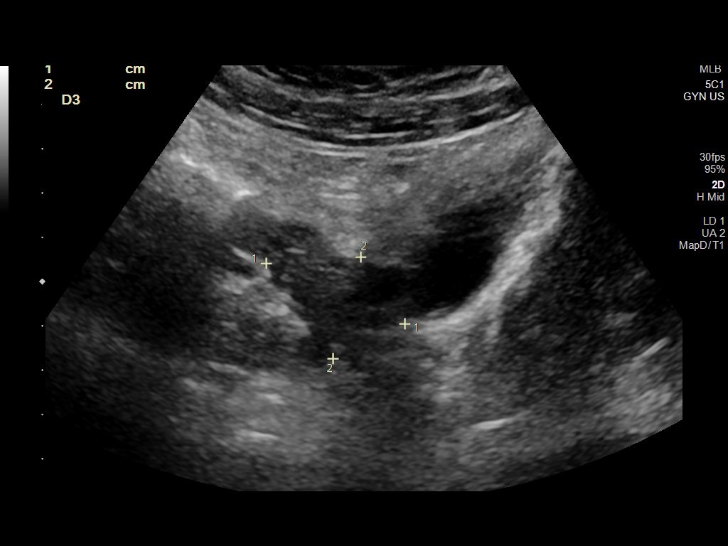
[im 18/69]
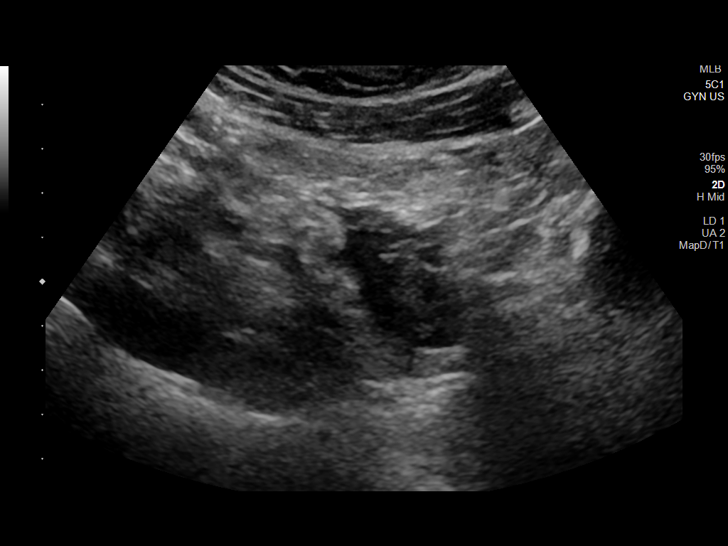
[im 23/69]
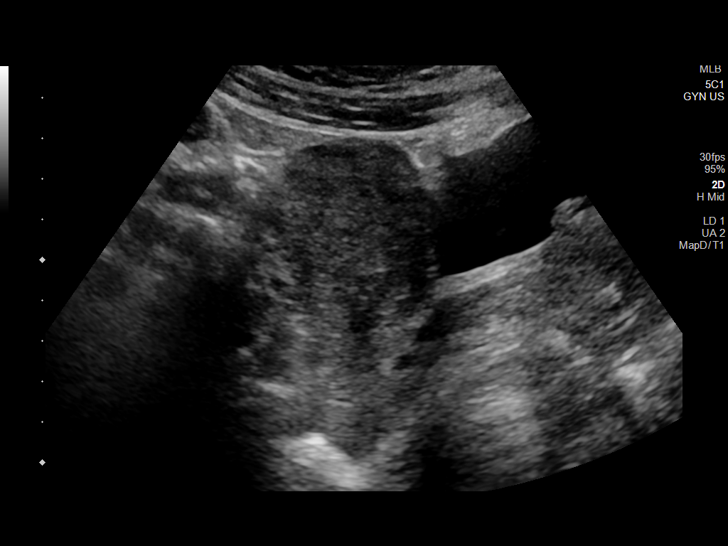
[im 29/69]
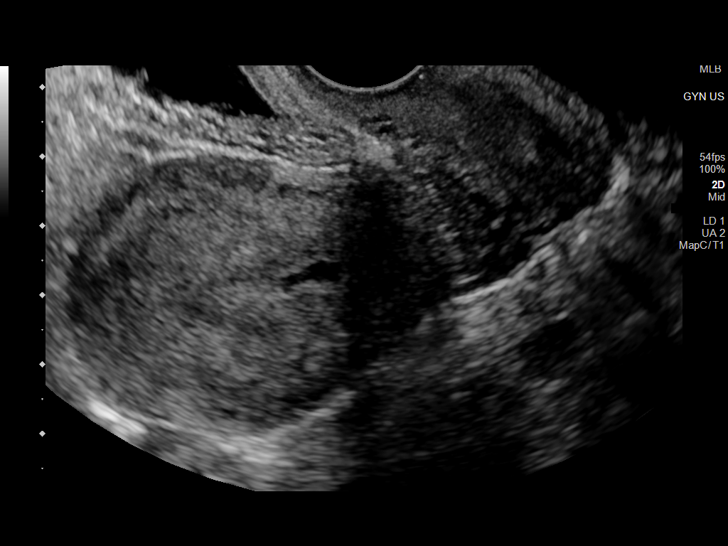
[im 35/69]
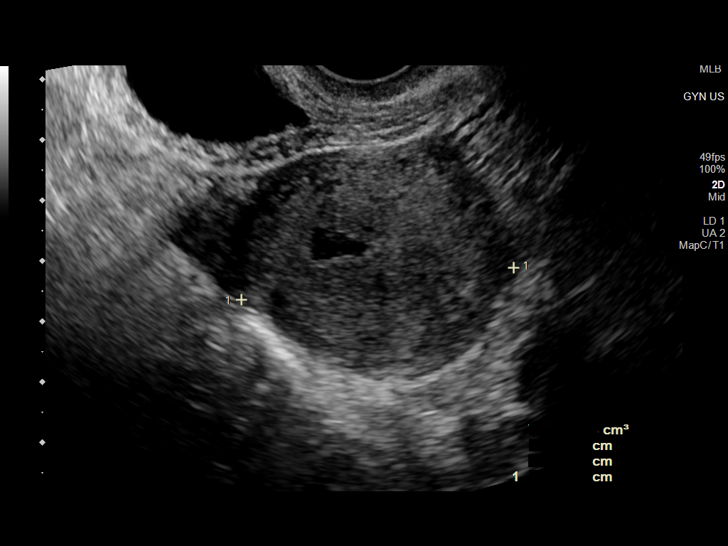
[im 40/69]
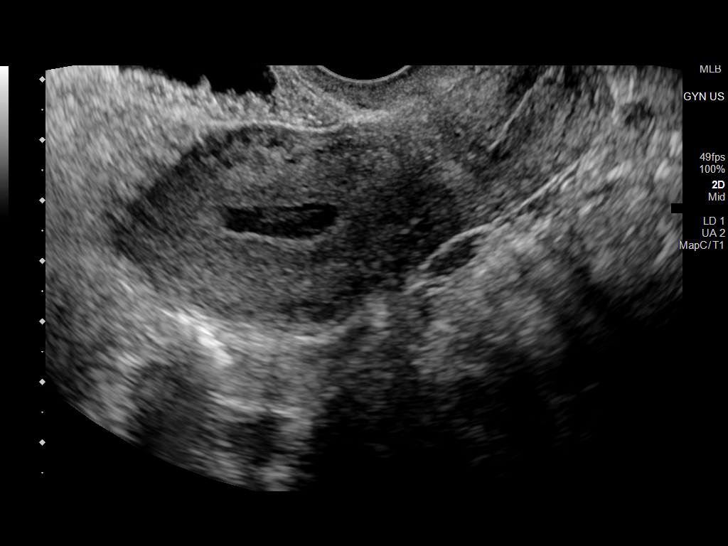
[im 46/69]
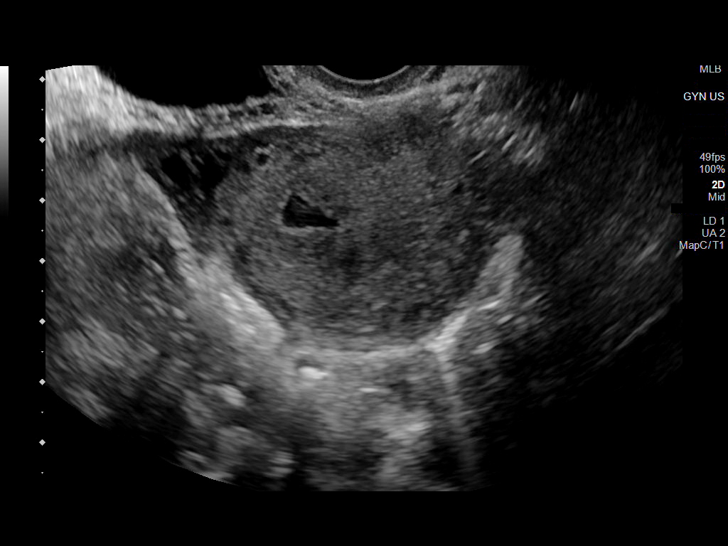
[im 52/69]
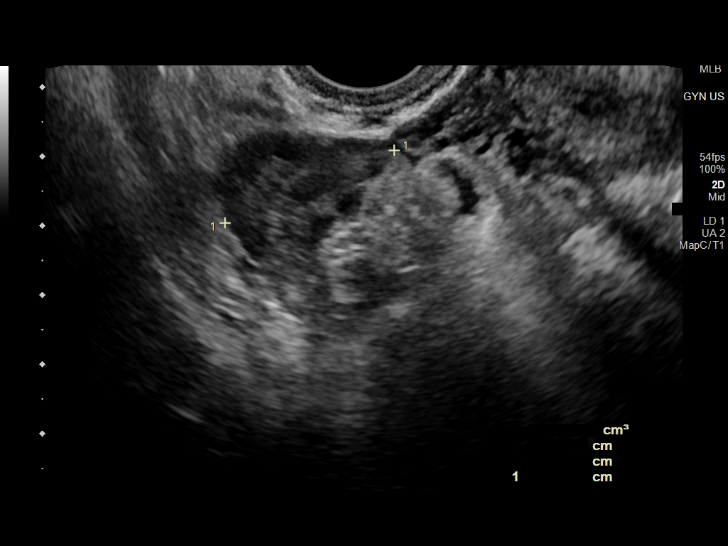
[im 57/69]
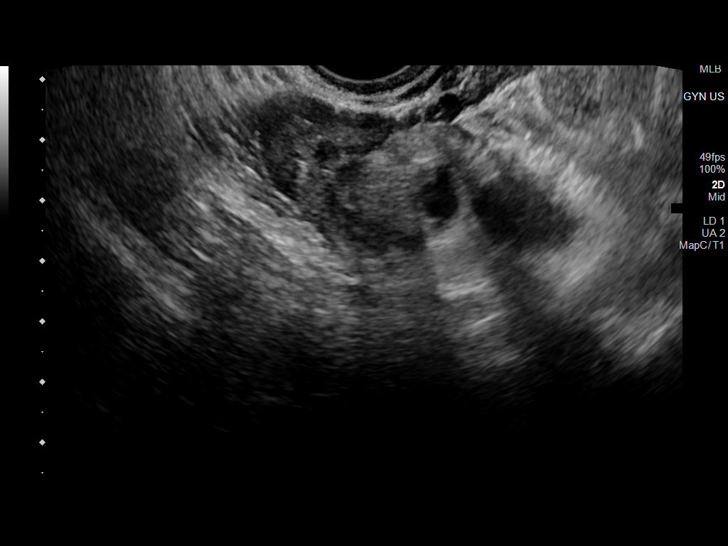
[im 63/69]
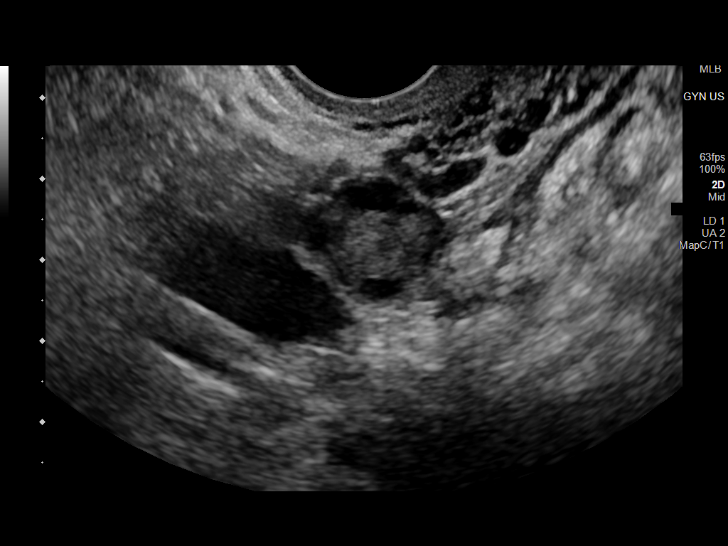
[im 69/69]
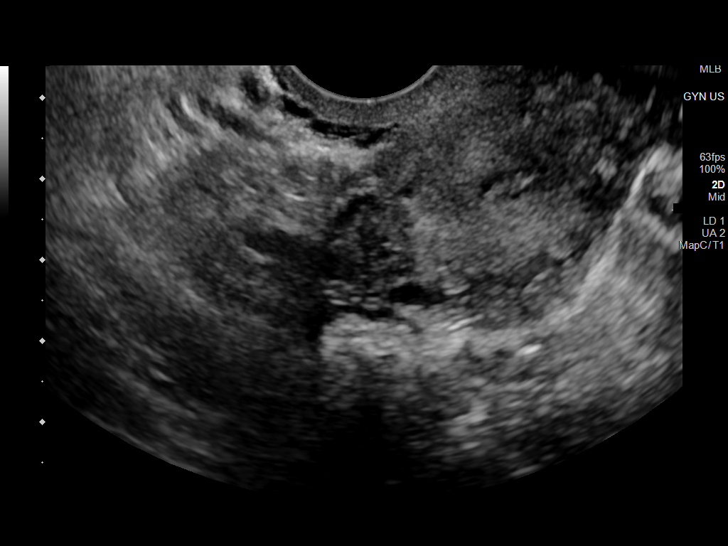

[13 of 25 positions shown; findings below may reference images not displayed]

FINDINGS: Uterus

Measurements: 8.4 cm x 4.0 cm x 9.5 cm = volume: 80.6 mL. The
uterine parenchyma is heterogeneous in echogenicity. No fibroids or
other mass is visualized.

Endometrium

Thickness: 3.6 mm. A mild amount of fluid and echogenic material is
seen within the endometrial canal. No abnormal flow is noted within
this region on color Doppler evaluation.

Right ovary

Measurements: 2.8 cm x 3.6 cm x 2.7 cm = volume: 14.4 mL. Normal
appearance/no adnexal mass.

Left ovary

Measurements: 2.0 cm x 1.6 cm x 2.0 cm = volume: 3.4 mL. Normal
appearance/no adnexal mass.

Other findings

No abnormal free fluid.
IMPRESSION: Small amount of fluid and echogenic material within the endometrial
canal. A small amount of blood products cannot be excluded.

## 2023-07-22 ENCOUNTER — Telehealth: Payer: Self-pay

## 2023-07-22 NOTE — Telephone Encounter (Signed)
 Return call made to patient concerning birth control depo. Pt had missed her last appt (4/15-4/29). Pt was informed that she would need to refrain from unprotected sex for 2 weeks and come in for a pregnancy test before restarting her depo. Pt voiced understanding.

## 2023-07-25 DIAGNOSIS — F4320 Adjustment disorder, unspecified: Secondary | ICD-10-CM | POA: Diagnosis not present

## 2023-07-29 ENCOUNTER — Other Ambulatory Visit: Payer: Self-pay

## 2023-07-29 ENCOUNTER — Other Ambulatory Visit: Payer: Self-pay | Admitting: Obstetrics

## 2023-07-29 DIAGNOSIS — Z30013 Encounter for initial prescription of injectable contraceptive: Secondary | ICD-10-CM

## 2023-07-29 DIAGNOSIS — Z3042 Encounter for surveillance of injectable contraceptive: Secondary | ICD-10-CM

## 2023-07-29 MED ORDER — MEDROXYPROGESTERONE ACETATE 150 MG/ML IM SUSP: 150.0000 mg | INTRAMUSCULAR | 0 refills | Status: DC

## 2023-07-31 ENCOUNTER — Ambulatory Visit

## 2023-07-31 VITALS — BP 106/73 | HR 78 | Wt 165.0 lb

## 2023-07-31 DIAGNOSIS — Z3042 Encounter for surveillance of injectable contraceptive: Secondary | ICD-10-CM

## 2023-07-31 DIAGNOSIS — Z3202 Encounter for pregnancy test, result negative: Secondary | ICD-10-CM

## 2023-07-31 LAB — POCT URINE PREGNANCY: Preg Test, Ur: NEGATIVE

## 2023-07-31 MED ORDER — MEDROXYPROGESTERONE ACETATE 150 MG/ML IM SUSP
150.0000 mg | Freq: Once | INTRAMUSCULAR | Status: AC
  Administered 2023-07-31: 150 mg via INTRAMUSCULAR

## 2023-07-31 NOTE — Progress Notes (Signed)
 Date last pap: Pt due, scheduling at check out. Last Depo-Provera : 03/04/23. Pt here for depo restart. Pt had taken a break from depo due to recent surgery, pt since been cleared to restart depo. Pt had been instructed no unprotected intercourse within two weeks, pt abstained, negative UPT in office today. Depo given.  Side Effects if any: NA. Serum HCG indicated? NA. Depo-Provera  150 mg IM given by: Duwaine Galla, RN. Next appointment due Sept 11-25 2025.

## 2023-08-09 DIAGNOSIS — F4323 Adjustment disorder with mixed anxiety and depressed mood: Secondary | ICD-10-CM | POA: Diagnosis not present

## 2023-08-14 ENCOUNTER — Other Ambulatory Visit: Payer: Self-pay | Admitting: Obstetrics and Gynecology

## 2023-08-14 DIAGNOSIS — B009 Herpesviral infection, unspecified: Secondary | ICD-10-CM

## 2023-08-15 ENCOUNTER — Encounter: Payer: Self-pay | Admitting: Obstetrics and Gynecology

## 2023-08-15 ENCOUNTER — Other Ambulatory Visit: Payer: Self-pay

## 2023-08-15 DIAGNOSIS — B009 Herpesviral infection, unspecified: Secondary | ICD-10-CM

## 2023-08-15 MED ORDER — VALACYCLOVIR HCL 500 MG PO TABS: ORAL_TABLET | ORAL | 0 refills | Status: DC

## 2023-08-18 ENCOUNTER — Telehealth: Admitting: Physician Assistant

## 2023-08-18 DIAGNOSIS — B379 Candidiasis, unspecified: Secondary | ICD-10-CM

## 2023-08-18 DIAGNOSIS — T3695XA Adverse effect of unspecified systemic antibiotic, initial encounter: Secondary | ICD-10-CM | POA: Diagnosis not present

## 2023-08-18 MED ORDER — FLUCONAZOLE 150 MG PO TABS: 150.0000 mg | ORAL_TABLET | ORAL | 0 refills | Status: DC | PRN

## 2023-08-18 NOTE — Progress Notes (Signed)

## 2023-08-29 DIAGNOSIS — F4323 Adjustment disorder with mixed anxiety and depressed mood: Secondary | ICD-10-CM | POA: Diagnosis not present

## 2023-09-08 ENCOUNTER — Encounter: Payer: Self-pay | Admitting: Obstetrics and Gynecology

## 2023-09-08 ENCOUNTER — Ambulatory Visit: Admitting: Obstetrics and Gynecology

## 2023-09-08 ENCOUNTER — Other Ambulatory Visit (HOSPITAL_COMMUNITY)
Admission: RE | Admit: 2023-09-08 | Discharge: 2023-09-08 | Disposition: A | Discharge status: Home / Self Care | Source: Ambulatory Visit | Attending: Obstetrics and Gynecology | Admitting: Obstetrics and Gynecology

## 2023-09-08 VITALS — BP 116/76 | HR 81 | Ht 68.0 in | Wt 161.6 lb

## 2023-09-08 DIAGNOSIS — Z113 Encounter for screening for infections with a predominantly sexual mode of transmission: Secondary | ICD-10-CM | POA: Diagnosis not present

## 2023-09-08 DIAGNOSIS — Z01419 Encounter for gynecological examination (general) (routine) without abnormal findings: Secondary | ICD-10-CM | POA: Diagnosis not present

## 2023-09-08 MED ORDER — MEDROXYPROGESTERONE ACETATE 150 MG/ML IM SUSP
150.0000 mg | INTRAMUSCULAR | 5 refills | Status: DC
Start: 2023-09-08 — End: 2023-12-30

## 2023-09-08 NOTE — Addendum Note (Signed)
 Addended by: TERESA MARGY CROME on: 09/08/2023 09:30 AM   Modules accepted: Orders

## 2023-09-08 NOTE — Progress Notes (Signed)
 Pt presents for annual exam. Pt requests all STI testing. Had 1 day of breakthrough bleeding that occurred on yesterday.

## 2023-09-08 NOTE — Progress Notes (Signed)
 Subjective:     Ashley [name] is a 28 y.o. female P3 with amenorrhea secondary to depo-provera  and BMI 24 who is here for a comprehensive physical exam. The patient reports no problems. Patient is sexually active without complaints. She denies pelvic pain or abnormal discharge. She denies urinary incontinence or issues with bowel movements. Patient is happy with depo-provera  and desires STI screening  Past Medical History:  Diagnosis Date   Concussion without loss of consciousness 12/02/2014   Past Surgical History:  Procedure Laterality Date   KNEE SURGERY Left 12/2019   Family History  Problem Relation Age of Onset   Migraines Mother    Diabetes Mother    Hypertension Mother    Diabetes Sister    Hypertension Sister     Social History   Socioeconomic History   Marital status: Legally Separated    Spouse name: Not on file   Number of children: 2   Years of education: Not on file   Highest education level: Master's degree (e.g., MA, MS, MEng, MEd, MSW, MBA)  Occupational History   Not on file  Tobacco Use   Smoking status: Never   Smokeless tobacco: Never  Vaping Use   Vaping status: Never Used  Substance and Sexual Activity   Alcohol use: Yes    Alcohol/week: 1.0 standard drink of alcohol    Types: 1 Standard drinks or equivalent per week    Comment: occ   Drug use: No   Sexual activity: Yes    Partners: Male    Birth control/protection: Injection  Other Topics Concern   Not on file  Social History Narrative   Not on file   Social Drivers of Health   Financial Resource Strain: Not on file  Food Insecurity: No Food Insecurity (12/30/2022)   Hunger Vital Sign    Worried About Running Out of Food in the Last Year: Never true    Ran Out of Food in the Last Year: Never true  Transportation Needs: No Transportation Needs (12/30/2022)   PRAPARE - Administrator, Civil Service (Medical): No    Lack of Transportation (Non-Medical): No   Physical Activity: Not on file  Stress: Not on file  Social Connections: Not on file  Intimate Partner Violence: Not At Risk (12/30/2022)   Humiliation, Afraid, Rape, and Kick questionnaire    Fear of Current or Ex-Partner: No    Emotionally Abused: No    Physically Abused: No    Sexually Abused: No   Health Maintenance  Topic Date Due   DTaP/Tdap/Td (1 - Tdap) Never done   Hepatitis B Vaccines (1 of 3 - 19+ 3-dose series) Never done   HPV VACCINES (1 - 3-dose SCDM series) Never done   COVID-19 Vaccine (1 - 2024-25 season) Never done   Cervical Cancer Screening (Pap smear)  06/13/2023   INFLUENZA VACCINE  09/05/2023   Hepatitis C Screening  Completed   HIV Screening  Completed   Meningococcal B Vaccine  Aged Out       Review of Systems Pertinent items noted in HPI and remainder of comprehensive ROS otherwise negative.   Objective:  Blood pressure 116/76, pulse 81, height 5' 8 (1.727 m), weight 161 lb 9.6 oz (73.3 kg).   GENERAL: Well-developed, well-nourished female in no acute distress.  HEENT: Normocephalic, atraumatic. Sclerae anicteric.  NECK: Supple. Normal thyroid.  LUNGS: Clear to auscultation bilaterally.  HEART: Regular rate and rhythm. BREASTS: Symmetric in size. No palpable masses or  lymphadenopathy, skin changes, or nipple drainage. ABDOMEN: Soft, nontender, nondistended. No organomegaly. PELVIC: Normal external female genitalia. Vagina is pink and rugated.  Normal discharge. Normal appearing cervix. Uterus is normal in size. No adnexal mass or tenderness. Chaperone present during the pelvic exam EXTREMITIES: No cyanosis, clubbing, or edema, 2+ distal pulses.     Assessment:    Healthy female exam.      Plan:    Pap smear collected STI screening ordered Patient will be contacted with abnormal results Refill on depo-provera  provided  See After Visit Summary for Counseling Recommendations

## 2023-09-09 LAB — CERVICOVAGINAL ANCILLARY ONLY
Bacterial Vaginitis (gardnerella): NEGATIVE
Candida Glabrata: NEGATIVE
Candida Vaginitis: NEGATIVE
Chlamydia: NEGATIVE
Comment: NEGATIVE
Comment: NEGATIVE
Comment: NEGATIVE
Comment: NEGATIVE
Comment: NEGATIVE
Comment: NORMAL
Neisseria Gonorrhea: NEGATIVE
Trichomonas: NEGATIVE

## 2023-09-09 LAB — HEPATITIS B SURFACE ANTIGEN: Hepatitis B Surface Ag: NEGATIVE

## 2023-09-09 LAB — HIV ANTIBODY (ROUTINE TESTING W REFLEX): HIV Screen 4th Generation wRfx: NONREACTIVE

## 2023-09-09 LAB — HEPATITIS C ANTIBODY: Hep C Virus Ab: NONREACTIVE

## 2023-09-09 LAB — RPR: RPR Ser Ql: NONREACTIVE

## 2023-09-10 LAB — CYTOLOGY - PAP
Adequacy: ABSENT
Diagnosis: NEGATIVE

## 2023-09-24 DIAGNOSIS — Z111 Encounter for screening for respiratory tuberculosis: Secondary | ICD-10-CM | POA: Diagnosis not present

## 2023-09-25 ENCOUNTER — Encounter: Payer: Self-pay | Admitting: Obstetrics

## 2023-09-26 DIAGNOSIS — Z111 Encounter for screening for respiratory tuberculosis: Secondary | ICD-10-CM | POA: Diagnosis not present

## 2023-10-01 ENCOUNTER — Encounter: Payer: Self-pay | Admitting: Obstetrics

## 2023-10-02 ENCOUNTER — Other Ambulatory Visit: Payer: Self-pay | Admitting: Obstetrics and Gynecology

## 2023-10-02 ENCOUNTER — Other Ambulatory Visit: Payer: Self-pay

## 2023-10-02 MED ORDER — VALACYCLOVIR HCL 1 G PO TABS: 1000.0000 mg | ORAL_TABLET | Freq: Every day | ORAL | 6 refills | Status: DC

## 2023-10-09 ENCOUNTER — Telehealth: Admitting: Physician Assistant

## 2023-10-09 ENCOUNTER — Ambulatory Visit: Admitting: Family Medicine

## 2023-10-09 DIAGNOSIS — B3731 Acute candidiasis of vulva and vagina: Secondary | ICD-10-CM | POA: Diagnosis not present

## 2023-10-09 MED ORDER — FLUCONAZOLE 150 MG PO TABS: 150.0000 mg | ORAL_TABLET | ORAL | 0 refills | Status: DC | PRN

## 2023-10-09 NOTE — Progress Notes (Signed)

## 2023-10-16 ENCOUNTER — Ambulatory Visit

## 2023-10-16 VITALS — BP 122/83 | HR 78

## 2023-10-16 DIAGNOSIS — Z3042 Encounter for surveillance of injectable contraceptive: Secondary | ICD-10-CM | POA: Diagnosis not present

## 2023-10-16 MED ORDER — MEDROXYPROGESTERONE ACETATE 150 MG/ML IM SUSP
150.0000 mg | INTRAMUSCULAR | Status: DC
  Administered 2023-10-16: 150 mg via INTRAMUSCULAR

## 2023-10-16 NOTE — Progress Notes (Signed)
 Pt is in the office for depo injection, administered in [place] and pt tolerated well. Next injection due Nov 27- Dec 11. .. Administrations This Visit     medroxyPROGESTERone  (DEPO-PROVERA ) injection 150 mg     Admin Date 10/16/2023 Action Given Dose 150 mg Route Intramuscular Documented By Doneta Laymon BIRCH, RN

## 2023-11-09 ENCOUNTER — Other Ambulatory Visit: Payer: Self-pay | Admitting: Obstetrics

## 2023-11-09 DIAGNOSIS — B009 Herpesviral infection, unspecified: Secondary | ICD-10-CM

## 2023-11-13 ENCOUNTER — Telehealth: Admitting: Physician Assistant

## 2023-11-13 DIAGNOSIS — J029 Acute pharyngitis, unspecified: Secondary | ICD-10-CM

## 2023-11-13 NOTE — Progress Notes (Signed)
 We are sorry that you are not feeling well.  Here is how we plan to help!  Your symptoms indicate a likely viral infection (Pharyngitis).   Pharyngitis is inflammation in the back of the throat which can cause a sore throat, scratchiness and sometimes difficulty swallowing.   Pharyngitis is typically caused by a respiratory virus and will just run its course.  Please keep in mind that your symptoms could last up to 10 days.  For throat pain, we recommend over the counter oral pain relief medications such as acetaminophen or aspirin, or anti-inflammatory medications such as ibuprofen or naproxen sodium.  Topical treatments such as oral throat lozenges or sprays may be used as needed.  Avoid close contact with loved ones, especially the very young and elderly.  Remember to wash your hands thoroughly throughout the day as this is the number one way to prevent the spread of infection and wipe down door knobs and counters with disinfectant.  After careful review of your answers, I would not recommend and antibiotic for your condition.  Antibiotics should not be used to treat conditions that we suspect are caused by viruses like the virus that causes the common cold or flu. However, some people can have Strep with atypical symptoms. You may need formal testing in clinic or office to confirm if your symptoms continue or worsen.  Providers prescribe antibiotics to treat infections caused by bacteria. Antibiotics are very powerful in treating bacterial infections when they are used properly.  To maintain their effectiveness, they should be used only when necessary.  Overuse of antibiotics has resulted in the development of super bugs that are resistant to treatment!    Home Care: Only take medications as instructed by your medical team. Do not drink alcohol while taking these medications. A steam or ultrasonic humidifier can help congestion.  You can place a towel over your head and breathe in the steam from hot  water coming from a faucet. Avoid close contacts especially the very young and the elderly. Cover your mouth when you cough or sneeze. Always remember to wash your hands.  Get Help Right Away If: You develop worsening fever or throat pain. You develop a severe head ache or visual changes. Your symptoms persist after you have completed your treatment plan.  Make sure you Understand these instructions. Will watch your condition. Will get help right away if you are not doing well or get worse.  Your e-visit answers were reviewed by a board certified advanced clinical practitioner to complete your personal care plan.  Depending on the condition, your plan could have included both over the counter or prescription medications.  If there is a problem please reply  once you have received a response from your provider.  Your safety is important to us .  If you have drug allergies check your prescription carefully.    You can use MyChart to ask questions about todays visit, request a non-urgent call back, or ask for a work or school excuse for 24 hours related to this e-Visit. If it has been greater than 24 hours you will need to follow up with your provider, or enter a new e-Visit to address those concerns.  You will get an e-mail in the next two days asking about your experience.  I hope that your e-visit has been valuable and will speed your recovery. Thank you for using e-visits.   I have spent 5 minutes in review of e-visit questionnaire, review and updating patient chart, medical  decision making and response to patient.   Elsie Velma Lunger, PA-C

## 2023-11-21 ENCOUNTER — Ambulatory Visit: Admitting: Obstetrics

## 2023-11-21 ENCOUNTER — Encounter: Payer: Self-pay | Admitting: Obstetrics

## 2023-11-21 VITALS — BP 119/75 | HR 86 | Ht 68.0 in | Wt 166.8 lb

## 2023-11-21 DIAGNOSIS — B009 Herpesviral infection, unspecified: Secondary | ICD-10-CM | POA: Diagnosis not present

## 2023-11-21 DIAGNOSIS — D508 Other iron deficiency anemias: Secondary | ICD-10-CM

## 2023-11-21 MED ORDER — VALACYCLOVIR HCL 1 G PO TABS: 1000.0000 mg | ORAL_TABLET | Freq: Every day | ORAL | 11 refills | Status: DC

## 2023-11-21 MED ORDER — ACCRUFER 30 MG PO CAPS: 1.0000 | ORAL_CAPSULE | Freq: Two times a day (BID) | ORAL | 3 refills | Status: AC

## 2023-11-21 MED ORDER — ACCRUFER 30 MG PO CAPS: 1.0000 | ORAL_CAPSULE | Freq: Two times a day (BID) | ORAL | 3 refills | Status: DC

## 2023-11-21 MED ORDER — PNV TABS 29-1 29-1 MG PO TABS: 1.0000 | ORAL_TABLET | Freq: Every day | ORAL | 3 refills | Status: DC

## 2023-11-21 NOTE — Progress Notes (Signed)
 Patient ID: Ashley [name], female   DOB: 05-31-1995, 28 y.o.   MRN: [id number]  Chief Complaint  Patient presents with   Medication Refill    HPI Ashley [name] is a 28 y.o. female.  History of genital herpes.  Having frequent outbreaks after surgery. HPI  Past Medical History:  Diagnosis Date   Concussion without loss of consciousness 12/02/2014    Past Surgical History:  Procedure Laterality Date   KNEE SURGERY Left 12/2019    Family History  Problem Relation Age of Onset   Migraines Mother    Diabetes Mother    Hypertension Mother    Diabetes Sister    Hypertension Sister     Social History Social History   Tobacco Use   Smoking status: Never   Smokeless tobacco: Never  Vaping Use   Vaping status: Never Used  Substance Use Topics   Alcohol use: Yes    Alcohol/week: 1.0 standard drink of alcohol    Types: 1 Standard drinks or equivalent per week    Comment: occ   Drug use: No    Allergies  Allergen Reactions   Penicillins Anaphylaxis, Hives and Rash    Foamed from mouth Has patient had a PCN reaction causing immediate rash, facial/tongue/throat swelling, SOB or lightheadedness with hypotension: Yes Has patient had a PCN reaction causing severe rash involving mucus membranes or skin necrosis: No Has patient had a PCN reaction that required hospitalization No Has patient had a PCN reaction occurring within the last 10 years: No If all of the above answers are NO, then may proceed with Cephalosporin use.     Current Outpatient Medications  Medication Sig Dispense Refill   medroxyPROGESTERone  (DEPO-PROVERA ) 150 MG/ML injection Inject 1 mL (150 mg total) into the muscle every 3 (three) months. 1 mL 5   Prenatal Vit-Iron  Carbonyl-FA (PNV TABS 29-1) 29-1 MG TABS Take 1 tablet by mouth daily before breakfast. 90 tablet 3   valACYclovir  (VALTREX ) 1000 MG tablet Take 1 tablet (1,000 mg total) by mouth daily. Take for 5 days 5  tablet 6   valACYclovir  (VALTREX ) 1000 MG tablet Take 1 tablet (1,000 mg total) by mouth daily. For suppression.  May take 1000 mg po bid prn any outbreaks. 30 tablet 11   Ferric Maltol (ACCRUFER) 30 MG CAPS Take 1 capsule (30 mg total) by mouth 2 (two) times daily before a meal. Take 2 hrs before, or 2 hrs after a meal. 60 capsule 3   fluconazole  (DIFLUCAN ) 150 MG tablet Take 1 tablet (150 mg total) by mouth every 3 (three) days as needed. (Patient not taking: Reported on 11/21/2023) 2 tablet 0   medroxyPROGESTERone  (DEPO-PROVERA ) 150 MG/ML injection Inject 1 mL (150 mg total) into the muscle every 3 (three) months. 1 mL 4   valACYclovir  (VALTREX ) 500 MG tablet TAKE AS DIRECTED FOR 3 DAYS AS NEEDED FOR EACH OUTBREAK (Patient not taking: Reported on 11/21/2023) 90 tablet 0   Current Facility-Administered Medications  Medication Dose Route Frequency Provider Last Rate Last Admin   medroxyPROGESTERone  (DEPO-PROVERA ) injection 150 mg  150 mg Intramuscular Q90 days Abigail Rollo DASEN, MD   150 mg at 10/16/23 9089    Review of Systems Review of Systems Constitutional: negative for fatigue and weight loss Respiratory: negative for cough and wheezing Cardiovascular: negative for chest pain, fatigue and palpitations Gastrointestinal: negative for abdominal pain and change in bowel habits Genitourinary: positive for frequent genital herpes outbreaks Integument/breast: negative for nipple discharge Musculoskeletal:negative for  myalgias Neurological: negative for gait problems and tremors Behavioral/Psych: negative for abusive relationship, depression Endocrine: negative for temperature intolerance      Blood pressure 119/75, pulse 86, height 5' 8 (1.727 m), weight 166 lb 12.8 oz (75.7 kg), last menstrual period 09/23/2023.  Physical Exam Physical Exam General:   Alert and no distress  Skin:   no rash or abnormalities  Lungs:   clear to auscultation bilaterally  Heart:   regular rate and rhythm,  S1, S2 normal, no murmur, click, rub or gallop  The remainder of the physical exam deferred due to the type of encounter.  I have spent a total of 15 minutes of face-to-face time, excluding clinical staff time, reviewing notes and preparing to see patient, ordering tests and/or medications, and counseling the patient.   Data Reviewed Wet Prep  Assessment      1. HSV-2 infection (Primary) Rx: - valACYclovir  (VALTREX ) 1000 MG tablet; Take 1 tablet (1,000 mg total) by mouth daily. For suppression.  May take 1000 mg po for 5 days prn any breakthrough outbreaks.  Dispense: 30 tablet; Refill: 11  2. Iron  deficiency anemia secondary to inadequate dietary iron  intake Rx: - Prenatal Vit-Iron  Carbonyl-FA (PNV TABS 29-1) 29-1 MG TABS; Take 1 tablet by mouth daily before breakfast.  Dispense: 90 tablet; Refill: 3 - Ferric Maltol (ACCRUFER) 30 MG CAPS; Take 1 capsule (30 mg total) by mouth 2 (two) times daily before a meal. Take 2 hrs before, or 2 hrs after a meal.  Dispense: 60 capsule; Refill: 3    Plan   Follow up prn  No orders of the defined types were placed in this encounter.  Meds ordered this encounter  Medications   valACYclovir  (VALTREX ) 1000 MG tablet    Sig: Take 1 tablet (1,000 mg total) by mouth daily. For suppression.  May take 1000 mg po bid prn any outbreaks.    Dispense:  30 tablet    Refill:  11   Prenatal Vit-Iron  Carbonyl-FA (PNV TABS 29-1) 29-1 MG TABS    Sig: Take 1 tablet by mouth daily before breakfast.    Dispense:  90 tablet    Refill:  3   DISCONTD: Ferric Maltol (ACCRUFER) 30 MG CAPS    Sig: Take 1 capsule (30 mg total) by mouth 2 (two) times daily before a meal. Take 2 hrs before, or 2 hrs after a meal.    Dispense:  60 capsule    Refill:  3   Ferric Maltol (ACCRUFER) 30 MG CAPS    Sig: Take 1 capsule (30 mg total) by mouth 2 (two) times daily before a meal. Take 2 hrs before, or 2 hrs after a meal.    Dispense:  60 capsule    Refill:  3     CARLIN RONAL CENTERS, MD, FACOG Attending Obstetrician & Gynecologist, [facility] for [facility], [facility], [place] 11/21/2023

## 2023-11-21 NOTE — Progress Notes (Signed)
 Pt presents for medications refill. No questions or concerns at this time.

## 2023-11-26 ENCOUNTER — Encounter: Payer: Self-pay | Admitting: Obstetrics

## 2023-11-27 ENCOUNTER — Other Ambulatory Visit: Payer: Self-pay | Admitting: Obstetrics

## 2023-11-27 DIAGNOSIS — N939 Abnormal uterine and vaginal bleeding, unspecified: Secondary | ICD-10-CM

## 2023-11-27 DIAGNOSIS — N946 Dysmenorrhea, unspecified: Secondary | ICD-10-CM

## 2023-11-27 IMAGING — US US OB < 14 WEEKS - US OB TV
1 series · 15 of 28 positions shown · non-contrast
Comparison: None Available.

CLINICAL DATA: Spotting, vaginal bleeding, cramping

EXAM:
OBSTETRIC <14 WK ULTRASOUND
TECHNIQUE: Transabdominal ultrasound was performed for evaluation of the
gestation as well as the maternal uterus and adnexal regions.

[Series 1: us ob < 14 weeks - us ob tv · 15 of 44 slices shown]
[im 1/44]
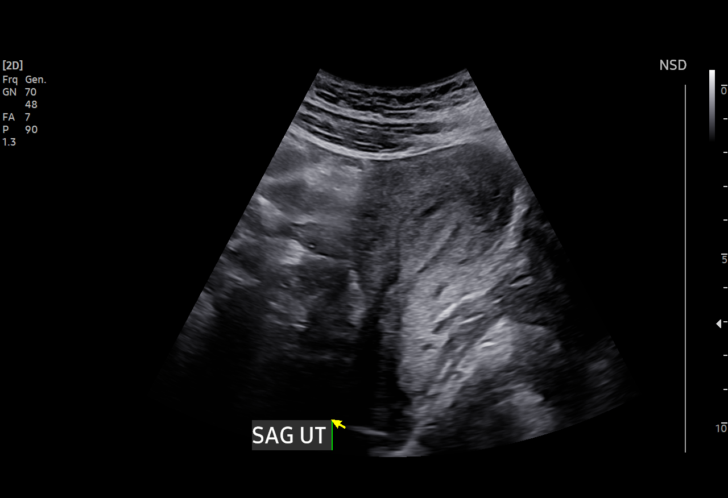
[im 4/44]
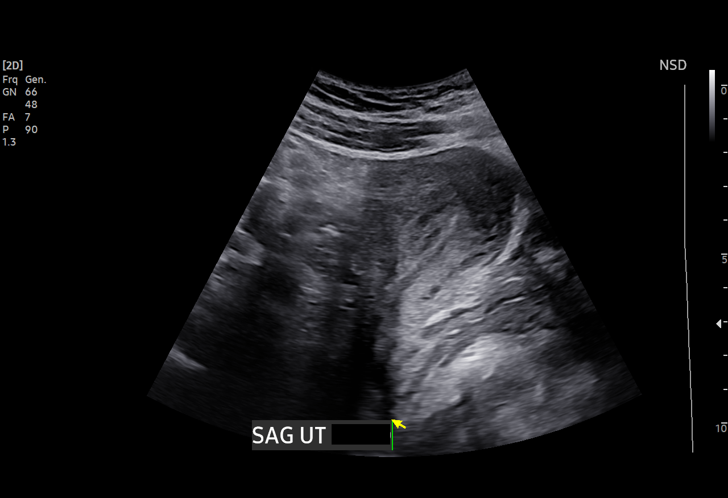
[im 7/44]
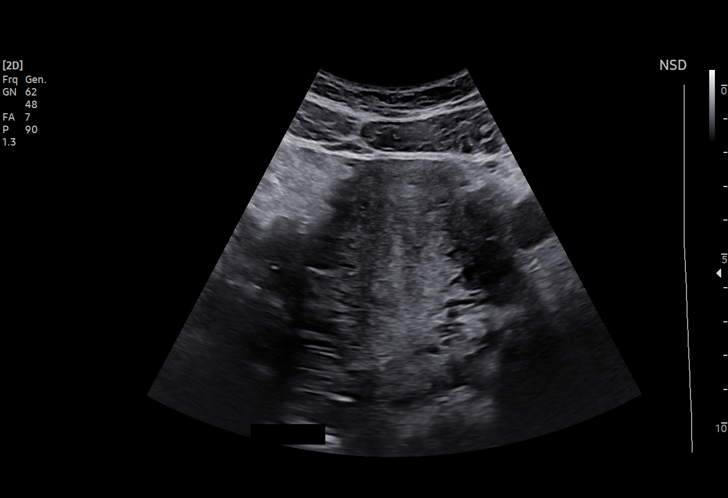
[im 10/44]
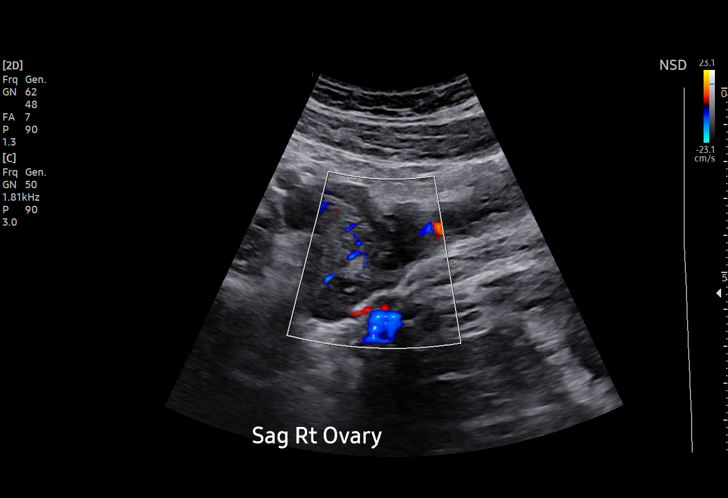
[im 13/44]
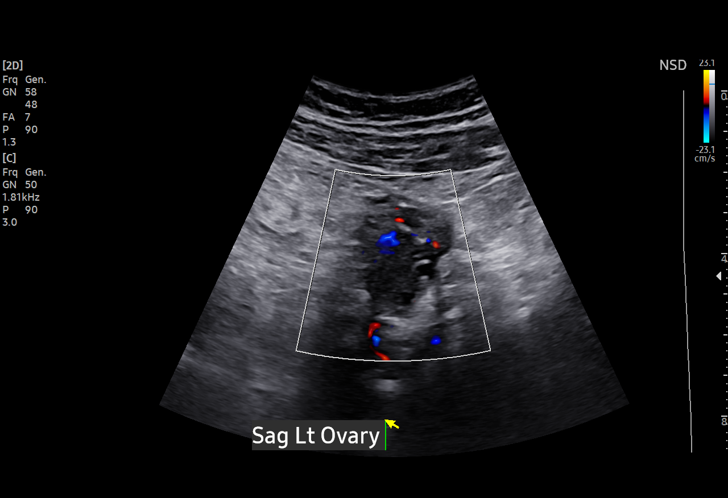
[im 16/44]
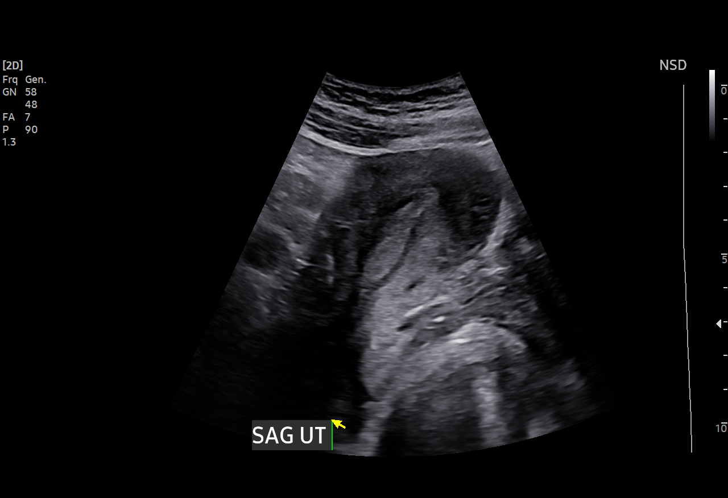
[im 20/44]
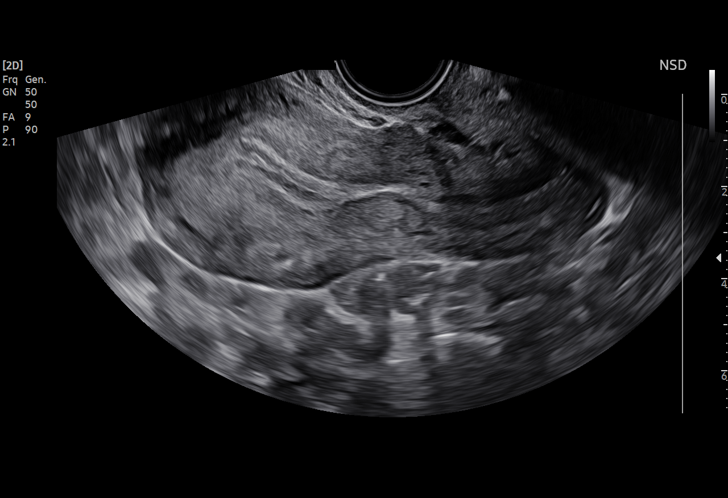
[im 23/44]
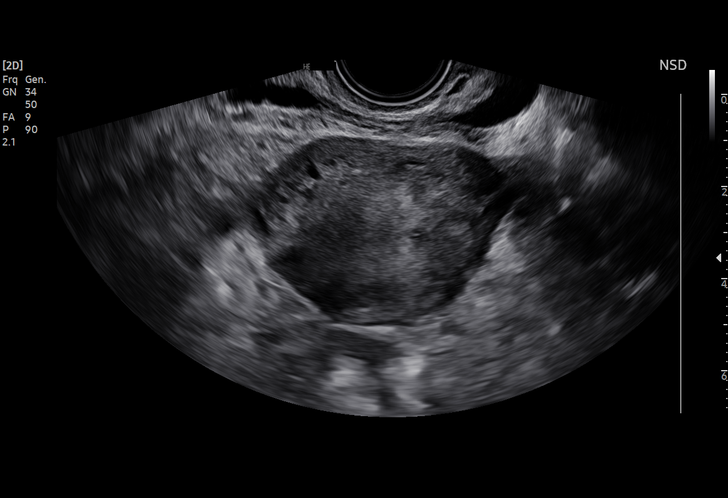
[im 24/44]
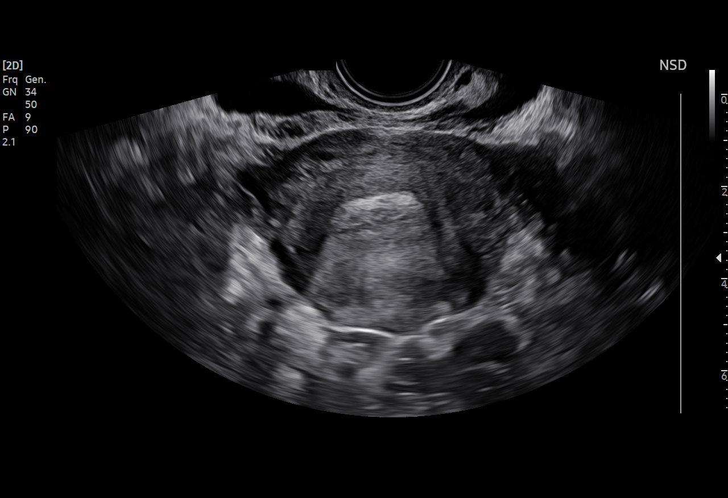
[im 28/44]
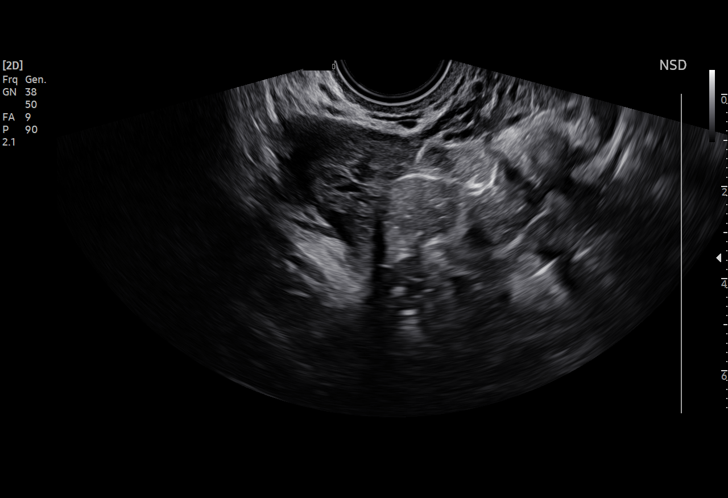
[im 31/44]
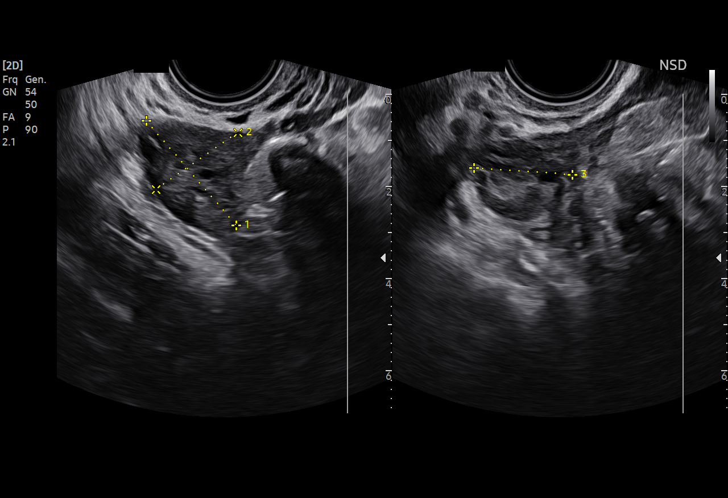
[im 34/44]
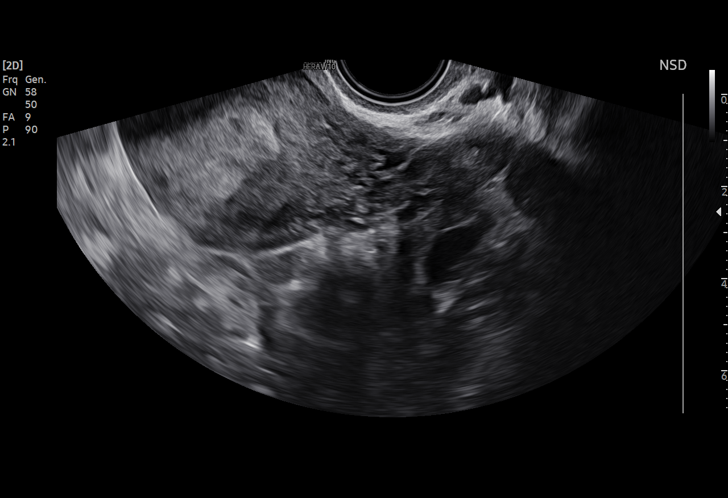
[im 37/44]
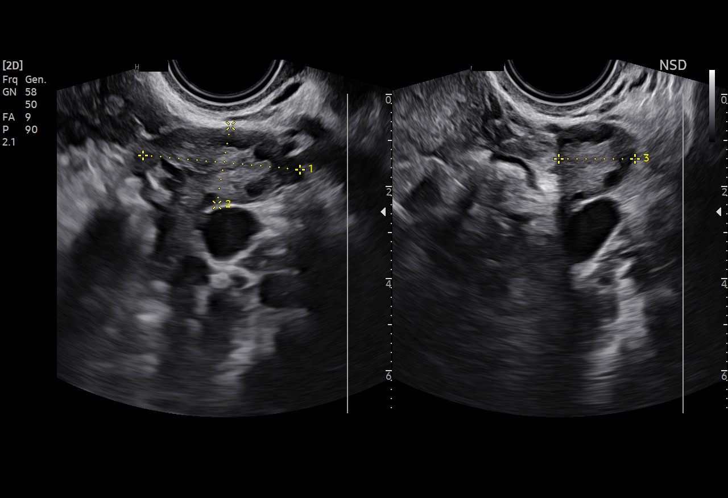
[im 40/44]
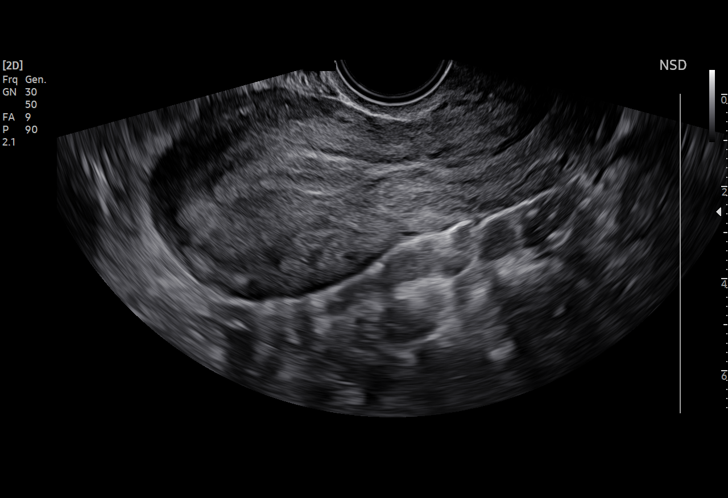
[im 44/44]
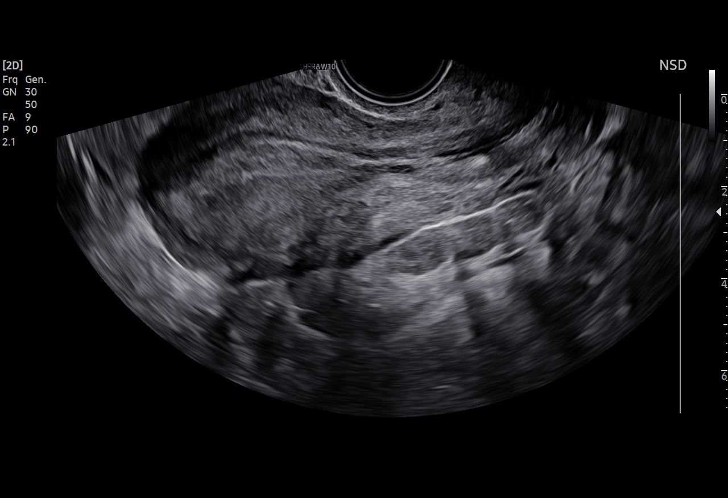

[15 of 28 positions shown; findings below may reference images not displayed]

FINDINGS: Intrauterine gestational sac: None

Yolk sac:  Not Visualized.

Embryo:  Not Visualized.

Cardiac Activity: Not Visualized.

Heart Rate: Not visualized.

Subchorionic hemorrhage:  None visualized.

Maternal uterus/adnexae: Unremarkable.
IMPRESSION: No candidate intrauterine gestation identified by ultrasound. Early
pregnancy of unknown location. Recommend ectopic precautions, serial
beta HCG and follow-up ultrasound in 7-14 days to assess for
development and viability.

## 2023-11-27 MED ORDER — IBUPROFEN 800 MG PO TABS: 800.0000 mg | ORAL_TABLET | Freq: Three times a day (TID) | ORAL | 5 refills | Status: AC | PRN

## 2023-11-27 MED ORDER — NORETHINDRONE-ETH ESTRADIOL 1-35 MG-MCG PO TABS
1.0000 | ORAL_TABLET | ORAL | 1 refills | Status: DC
Start: 2023-11-27 — End: 2023-11-28

## 2023-12-08 ENCOUNTER — Encounter: Payer: Self-pay | Admitting: Obstetrics

## 2023-12-21 ENCOUNTER — Telehealth: Admitting: Family

## 2023-12-21 DIAGNOSIS — B3731 Acute candidiasis of vulva and vagina: Secondary | ICD-10-CM

## 2023-12-21 MED ORDER — FLUCONAZOLE 150 MG PO TABS: 150.0000 mg | ORAL_TABLET | ORAL | 0 refills | Status: DC | PRN

## 2023-12-21 NOTE — Progress Notes (Signed)

## 2023-12-23 ENCOUNTER — Telehealth: Admitting: Emergency Medicine

## 2023-12-23 DIAGNOSIS — R12 Heartburn: Secondary | ICD-10-CM

## 2023-12-23 NOTE — Progress Notes (Signed)
 We are sorry that you are not feeling well.  Here is how we plan to help!  Based on what you shared with me it looks like you most likely have Gastroesophageal Reflux Disease (GERD)  Gastroesophageal reflux disease (GERD) happens when acid from your stomach flows up into the esophagus.  When acid comes in contact with the esophagus, the acid causes sorenss (inflammation) in the esophagus.  Over time, GERD may create small holes (ulcers) in the lining of the esophagus.  I recommend using over the counter Pepcid 20mg  one by mouth twice a day for two weeks.  Your symptoms should improve in the next day or two.  You can use antacids as needed until symptoms resolve.  Call us  if your heartburn worsens, you have trouble swallowing, weight loss, spitting up blood or recurrent vomiting.  Home Care: May include lifestyle changes such as weight loss, quitting smoking and alcohol consumption Avoid foods and drinks that make your symptoms worse, such as: Caffeine or alcoholic drinks Chocolate Peppermint or mint flavorings Garlic and onions Spicy foods Citrus fruits, such as oranges, lemons, or limes Tomato-based foods such as sauce, chili, salsa and pizza Fried and fatty foods Avoid lying down for 3 hours prior to your bedtime or prior to taking a nap Eat small, frequent meals instead of a large meals Wear loose-fitting clothing.  Do not wear anything tight around your waist that causes pressure on your stomach. Raise the head of your bed 6 to 8 inches with wood blocks to help you sleep.  Extra pillows will not help.  Seek Help Right Away If: You have pain in your arms, neck, jaw, teeth or back Your pain increases or changes in intensity or duration You develop nausea, vomiting or sweating (diaphoresis) You develop shortness of breath or you faint Your vomit is green, yellow, black or looks like coffee grounds or blood Your stool is red, bloody or black  These symptoms could be signs of other  problems, such as heart disease, gastric bleeding or esophageal bleeding.  Make sure you : Understand these instructions. Will watch your condition. Will get help right away if you are not doing well or get worse.  Your e-visit answers were reviewed by a board certified advanced clinical practitioner to complete your personal care plan.  Depending on the condition, your plan could have included both over the counter or prescription medications.  If there is a problem please reply  once you have received a response from your provider.  Your safety is important to us .  If you have drug allergies check your prescription carefully.    You can use MyChart to ask questions about today's visit, request a non-urgent call back, or ask for a work or school excuse for 24 hours related to this e-Visit. If it has been greater than 24 hours you will need to follow up with your provider, or enter a new e-Visit to address those concerns.  You will get an e-mail in the next two days asking about your experience.  I hope that your e-visit has been valuable and will speed your recovery. Thank you for using e-visits.   I have spent 5 minutes in review of e-visit questionnaire, review and updating patient chart, medical decision making and response to patient.   Jon Belt, PhD, FNP-BC

## 2023-12-30 ENCOUNTER — Other Ambulatory Visit: Payer: Self-pay

## 2023-12-30 ENCOUNTER — Ambulatory Visit
Admission: EM | Admit: 2023-12-30 | Discharge: 2023-12-30 | Disposition: A | Discharge status: Home / Self Care | Attending: Student | Admitting: Student

## 2023-12-30 DIAGNOSIS — J029 Acute pharyngitis, unspecified: Secondary | ICD-10-CM | POA: Insufficient documentation

## 2023-12-30 DIAGNOSIS — R09A2 Foreign body sensation, throat: Secondary | ICD-10-CM | POA: Insufficient documentation

## 2023-12-30 LAB — POCT RAPID STREP A (OFFICE): Rapid Strep A Screen: NEGATIVE

## 2023-12-30 LAB — POC COVID19/FLU A&B COMBO
Covid Antigen, POC: NEGATIVE
Influenza A Antigen, POC: NEGATIVE
Influenza B Antigen, POC: NEGATIVE

## 2023-12-30 MED ORDER — LANSOPRAZOLE 15 MG PO CPDR: 15.0000 mg | DELAYED_RELEASE_CAPSULE | Freq: Every day | ORAL | 0 refills | Status: AC

## 2023-12-30 NOTE — ED Triage Notes (Signed)
 Pt c/o sore throatx3d. Feels like something in throat, numbness on tongue started yesterday

## 2023-12-30 NOTE — ED Provider Notes (Signed)
 [facility]    CSN: [id number] Arrival date & time: 12/30/23  9182      History   Chief Complaint No chief complaint on file.   HPI Ashley [name] is a 28 y.o. female presenting with sore throat x3 days.  Has had tonsolliths in the past. Feels like something in throat, numbness on tongue started yesterday.  Also with nasal congestion and ear pressure.  She reports a dry cough every 2 or so hours.  She had GERD about 2 weeks ago, but this resolved after course of famotidine.  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 Authorizing Provider  lansoprazole  (PREVACID ) 15 MG capsule Take 1 capsule (15 mg total) by mouth daily at 12 noon for 14 days. 12/30/23 01/13/24 Yes Kaili Castille E, PA-C  Ferric Maltol (ACCRUFER) 30 MG CAPS Take 1 capsule (30 mg total) by mouth 2 (two) times daily before a meal. Take 2 hrs before, or 2 hrs after a meal. 11/21/23   Rudy Carlin LABOR, MD  ibuprofen  (ADVIL ) 800 MG tablet Take 1 tablet (800 mg total) by mouth every 8 (eight) hours as needed. 11/27/23   Rudy Carlin LABOR, MD  Prenatal Vit-Iron  Carbonyl-FA (PNV TABS 29-1) 29-1 MG TABS Take 1 tablet by mouth daily before  breakfast. 11/21/23   Rudy Carlin LABOR, MD    Family History Family History  Problem Relation Age of Onset   Migraines Mother    Diabetes Mother    Hypertension Mother    Diabetes Sister    Hypertension Sister     Social History Social History   Tobacco Use   Smoking status: Never   Smokeless tobacco: Never  Vaping Use   Vaping status: Never Used  Substance Use Topics   Alcohol use: Yes    Alcohol/week: 1.0 standard drink of alcohol    Types: 1 Standard drinks or equivalent per week    Comment: occ   Drug use: No     Allergies   Penicillins   Review of Systems Review of Systems  Constitutional:  Negative for appetite change, chills and fever.  HENT:  Positive for sore throat. Negative for congestion, ear pain, rhinorrhea, sinus pressure and sinus pain.   Eyes:  Negative for redness and visual disturbance.  Respiratory:  Negative for cough, chest tightness, shortness of breath and wheezing.   Cardiovascular:  Negative for chest pain and palpitations.  Gastrointestinal:  Negative for abdominal  pain, constipation, diarrhea, nausea and vomiting.  Genitourinary:  Negative for dysuria, frequency and urgency.  Musculoskeletal:  Negative for myalgias.  Neurological:  Negative for dizziness, weakness and headaches.  Psychiatric/Behavioral:  Negative for confusion.   All other systems reviewed and are negative.    Physical Exam Triage Vital Signs ED Triage Vitals  Encounter Vitals Group     BP 12/30/23 0825 120/72     Girls Systolic BP Percentile --      Girls Diastolic BP Percentile --      Boys Systolic BP Percentile --      Boys Diastolic BP Percentile --      Pulse Rate 12/30/23 0825 89     Resp 12/30/23 0825 16     Temp 12/30/23 0825 99.1 F (37.3 C)     Temp Source 12/30/23 0825 Oral     SpO2 12/30/23 0825 97 %     Weight --      Height --      Head Circumference --      Peak Flow --      Pain Score 12/30/23 0823 0     Pain Loc --      Pain  Education --      Exclude from Growth Chart --    No data found.  Updated Vital Signs BP 120/72   Pulse 89   Temp 99.1 F (37.3 C) (Oral)   Resp 16   SpO2 97%   Visual Acuity Right Eye Distance:   Left Eye Distance:   Bilateral Distance:    Right Eye Near:   Left Eye Near:    Bilateral Near:     Physical Exam Vitals reviewed.  Constitutional:      General: She is not in acute distress.    Appearance: Normal appearance. She is not ill-appearing.  HENT:     Head: Normocephalic and atraumatic.     Right Ear: Tympanic membrane, ear canal and external ear normal. No tenderness. No middle ear effusion. There is no impacted cerumen. Tympanic membrane is not perforated, erythematous, retracted or bulging.     Left Ear: Tympanic membrane, ear canal and external ear normal. No tenderness.  No middle ear effusion. There is no impacted cerumen. Tympanic membrane is not perforated, erythematous, retracted or bulging.     Nose: Nose normal. No congestion.     Mouth/Throat:     Mouth: Mucous membranes are moist.     Pharynx: Uvula midline. Posterior oropharyngeal erythema present. No oropharyngeal exudate.     Tonsils: No tonsillar exudate. 1+ on the right. 1+ on the left.     Comments: There is mild posterior pharyngeal erythema.  Tonsils are 1+ bilaterally and cryptic. Eyes:     Extraocular Movements: Extraocular movements intact.     Pupils: Pupils are equal, round, and reactive to light.  Cardiovascular:     Rate and Rhythm: Normal rate and regular rhythm.     Heart sounds: Normal heart sounds.  Pulmonary:     Effort: Pulmonary effort is normal.     Breath sounds: Normal breath sounds. No decreased breath sounds, wheezing, rhonchi or rales.  Abdominal:     Palpations: Abdomen is soft.     Tenderness: There is no abdominal tenderness. There is no guarding or rebound.  Lymphadenopathy:     Cervical: No cervical adenopathy.     Right cervical: No superficial, deep or posterior  cervical adenopathy.    Left cervical: No superficial, deep or posterior cervical  adenopathy.  Skin:    Comments: No rash   Neurological:     General: No focal deficit present.     Mental Status: She is alert and oriented to person, place, and time.  Psychiatric:        Mood and Affect: Mood normal.        Behavior: Behavior normal.        Thought Content: Thought content normal.        Judgment: Judgment normal.      UC Treatments / Results  Labs (all labs ordered are listed, but only abnormal results are displayed) Labs Reviewed  CULTURE, GROUP A STREP [facility])  POCT RAPID STREP A (OFFICE)  POC COVID19/FLU A&B COMBO    EKG   Radiology No results found.  Procedures Procedures (including critical care time)  Medications Ordered in UC Medications - No data to display  Initial Impression / Assessment and Plan / UC Course  I have reviewed the triage vital signs and the nursing notes.  Pertinent labs & imaging results that were available during my care of the patient were reviewed by me and considered in my medical decision making (see chart for details).     Patient is a pleasant 28 year old female presenting with sore throat and globus sensation. The patient is afebrile and nontachycardic.  Antipyretic has not been administered today.  -Rapid strep negative, culture sent.  -Covid negative -Influenza negative  DDx is tonsillolith versus viral pharyngitis versus GERD.  I did not visualize a tonsillolith on exam, but she is prone to these, and it is possible that there is one present that I was unable to see.  Also with GERD about 2 weeks ago, though this has improved since then.  14-day course of lansoprazole  sent. Also encouraged salt water gargles.   Final Clinical Impressions(s) / UC Diagnoses   Final diagnoses:  Sore throat  Globus sensation     Discharge Instructions      - Your COVID, flu, influenza tests were all negative. - It is possible that a small  tonsil stone is causing your symptoms.  It is also possible you have a mild virus, like the common cold. - I sent a short course of a medication called lansoprazole , which helps reduce acid.  Take this every day at noon for 2 weeks.  This may also help your symptoms.     ED Prescriptions     Medication Sig Dispense Auth. Provider   lansoprazole  (PREVACID ) 15 MG capsule Take 1 capsule (15 mg total) by mouth daily at 12 noon for 14 days. 14 capsule Raisha Brabender E, PA-C      PDMP not reviewed this encounter.   Arlyss Leita BRAVO, PA-C 12/30/23 [phone]

## 2023-12-30 NOTE — Discharge Instructions (Signed)
-   Your COVID, flu, influenza tests were all negative. - It is possible that a small tonsil stone is causing your symptoms.  It is also possible you have a mild virus, like the common cold. - I sent a short course of a medication called lansoprazole , which helps reduce acid.  Take this every day at noon for 2 weeks.  This may also help your symptoms.

## 2023-12-31 ENCOUNTER — Ambulatory Visit
Admission: EM | Admit: 2023-12-31 | Discharge: 2023-12-31 | Disposition: A | Discharge status: Home / Self Care | Attending: Family Medicine | Admitting: Family Medicine

## 2023-12-31 DIAGNOSIS — R070 Pain in throat: Secondary | ICD-10-CM | POA: Diagnosis not present

## 2023-12-31 DIAGNOSIS — J028 Acute pharyngitis due to other specified organisms: Secondary | ICD-10-CM | POA: Diagnosis not present

## 2023-12-31 LAB — POCT RAPID STREP A (OFFICE): Rapid Strep A Screen: NEGATIVE

## 2023-12-31 MED ORDER — DEXAMETHASONE SOD PHOSPHATE PF 10 MG/ML IJ SOLN
10.0000 mg | Freq: Once | INTRAMUSCULAR | Status: AC
  Administered 2023-12-31: 10 mg via INTRAMUSCULAR

## 2023-12-31 NOTE — ED Triage Notes (Signed)
 Pt c/o sore throat-was seen here yesterday for same-had neg strep, covid and flu-NAD-steady gait

## 2023-12-31 NOTE — Discharge Instructions (Signed)
 You have received a steroid injection to help with your throat pain, hoarseness, bilateral ear fullness.  I recommend also using Tylenol , Zyrtec  and pseudoephedrine  for your symptoms.  Will let you know about your strep culture once it returns.  Due to the holiday, there can be delays in the culture.

## 2023-12-31 NOTE — ED Provider Notes (Signed)
 [facility]  Note:  This document was prepared using Conservation officer, historic buildings and may include unintentional dictation errors.  MRN: [id number] DOB: 10-25-1995  Subjective:   Ashley [name] is a 28 y.o. female presenting for recheck from her visit yesterday. Tested negative for strep, flu, covid. Feels worse since yesterday, has more pain over night.  Notes more hoarseness as well.  Has had symptoms for 4 to 5 days.  Would like to be tested for strep again as she is concerned she needs an antibiotic for her symptoms.  Would also like other treatment for her pain.  No current facility-administered medications for this encounter.  Current Outpatient Medications:    Ferric Maltol (ACCRUFER) 30 MG CAPS, Take 1 capsule (30 mg total) by mouth 2 (two) times daily before a meal. Take 2 hrs before, or 2 hrs after a meal., Disp: 60 capsule, Rfl: 3   ibuprofen  (ADVIL ) 800 MG tablet, Take 1 tablet (800 mg total) by mouth every 8 (eight) hours as needed., Disp: 30 tablet, Rfl: 5   lansoprazole  (PREVACID ) 15 MG capsule, Take 1 capsule (15 mg total) by mouth daily at 12 noon for 14 days., Disp: 14 capsule, Rfl: 0   Prenatal Vit-Iron  Carbonyl-FA (PNV TABS 29-1) 29-1 MG TABS, Take 1 tablet by mouth daily before breakfast., Disp: 90 tablet, Rfl: 3   Allergies  Allergen Reactions   Penicillins Anaphylaxis, Hives and Rash    Foamed from mouth Has patient had a PCN reaction causing immediate rash, facial/tongue/throat swelling, SOB or lightheadedness with hypotension: Yes Has patient had a PCN reaction causing severe rash involving mucus membranes or skin necrosis: No Has patient had a PCN reaction that required hospitalization No Has patient had a PCN reaction occurring within the last 10 years: No If all of the above answers are NO, then may proceed with Cephalosporin use.     Past Medical History:  Diagnosis Date   Concussion without loss of  consciousness 12/02/2014     Past Surgical History:  Procedure Laterality Date   KNEE SURGERY Left 12/2019    Family History  Problem Relation Age of Onset   Migraines Mother    Diabetes Mother    Hypertension Mother    Diabetes Sister    Hypertension Sister     Social History   Tobacco Use   Smoking status: Never   Smokeless tobacco: Never  Vaping Use   Vaping status: Never Used  Substance Use Topics   Alcohol use: Not Currently    Comment: occ   Drug use: No    ROS   Objective:   Vitals: BP (P) 120/86 (BP Location: Right Arm)   Pulse (P) 74   Temp (P) 98 F (36.7 C) (Oral)   Resp (P) 16   SpO2 (P) 96%   Physical Exam Constitutional:      General: She is not in acute distress.    Appearance: Normal appearance. She is well-developed. She is not ill-appearing, toxic-appearing or diaphoretic.  HENT:     Head: Normocephalic and atraumatic.     Right Ear: Tympanic membrane, ear canal and external ear normal. No tenderness. There is no impacted cerumen. Tympanic membrane is not injected, perforated, erythematous or bulging.     Left Ear: Tympanic membrane, ear canal and external ear normal. No tenderness. There is no impacted cerumen. Tympanic membrane is not injected, perforated, erythematous or bulging.     Nose: Nose normal.  Mouth/Throat:     Mouth: Mucous membranes are moist.     Pharynx: No pharyngeal swelling, oropharyngeal exudate, posterior oropharyngeal erythema or uvula swelling.     Tonsils: No tonsillar exudate or tonsillar abscesses. 0 on the right. 0 on the left.  Eyes:     General: No scleral icterus.       Right eye: No discharge.        Left eye: No discharge.     Extraocular Movements: Extraocular movements intact.  Cardiovascular:     Rate and Rhythm: Normal rate.  Pulmonary:     Effort: Pulmonary effort is normal.  Skin:    General: Skin is warm and dry.  Neurological:     General: No focal deficit present.     Mental Status: She  is alert and oriented to person, place, and time.  Psychiatric:        Mood and Affect: Mood normal.        Behavior: Behavior normal.     Results for orders placed or performed during the hospital encounter of 12/30/23 (from the past 24 hours)  POCT rapid strep A     Status: None   Collection Time: 12/30/23  8:44 AM  Result Value Ref Range   Rapid Strep A Screen Negative Negative  POC Covid19/Flu A&B Antigen     Status: None   Collection Time: 12/30/23  8:44 AM  Result Value Ref Range   Influenza A Antigen, POC Negative Negative   Influenza B Antigen, POC Negative Negative   Covid Antigen, POC Negative Negative   IM dexamethasone  10 mg administered in clinic.  Assessment and Plan :   PDMP not reviewed this encounter.  1. Acute pharyngitis due to other specified organisms   2. Throat pain    Rapid strep test negative again.  Will send out culture.  Throat exam is unremarkable apart from standard postnasal drainage.  As patient requested aggressive management given the Thanksgiving holiday tomorrow, I offered dexamethasone .  Recommend taking Tylenol , Zyrtec  and pseudoephedrine  as well.   Christopher Savannah, [place] 12/31/23 [phone]

## 2024-01-02 LAB — CULTURE, GROUP A STREP (THRC)

## 2024-01-05 ENCOUNTER — Ambulatory Visit

## 2024-01-05 VITALS — BP 122/82 | HR 81 | Wt 173.3 lb

## 2024-01-05 DIAGNOSIS — Z3042 Encounter for surveillance of injectable contraceptive: Secondary | ICD-10-CM

## 2024-01-05 MED ORDER — MEDROXYPROGESTERONE ACETATE 150 MG/ML IM SUSP
150.0000 mg | INTRAMUSCULAR | Status: AC
  Administered 2024-01-05: 150 mg via INTRAMUSCULAR

## 2024-01-05 NOTE — Progress Notes (Signed)
..  Date last pap: 09/08/23. Last Depo-Provera : 10/16/23. Side Effects if any: N/a. Serum HCG indicated? N/a. Depo-Provera  150 mg IM in R Del Next appointment due Feb 16- March 1. .. Administrations This Visit     medroxyPROGESTERone  (DEPO-PROVERA ) injection 150 mg     Admin Date 01/05/2024 Action Given Dose 150 mg Route Intramuscular Documented By Doneta Laymon BIRCH, RN

## 2024-01-16 ENCOUNTER — Encounter

## 2024-03-05 ENCOUNTER — Telehealth: Admitting: Physician Assistant

## 2024-03-05 DIAGNOSIS — B9689 Other specified bacterial agents as the cause of diseases classified elsewhere: Secondary | ICD-10-CM

## 2024-03-05 DIAGNOSIS — N760 Acute vaginitis: Secondary | ICD-10-CM

## 2024-03-05 MED ORDER — METRONIDAZOLE 500 MG PO TABS: 500.0000 mg | ORAL_TABLET | Freq: Two times a day (BID) | ORAL | 0 refills | Status: AC

## 2024-03-05 NOTE — Progress Notes (Signed)

## 2024-03-22 ENCOUNTER — Ambulatory Visit
